# Patient Record
Sex: Female | Born: 1985 | Race: White | Hispanic: No | Marital: Single | Smoking: Former smoker
Health system: Southern US, Community
[De-identification: ages and names within clinical notes are randomized; demographics above are authoritative.]

## PROBLEM LIST (undated history)

## (undated) DIAGNOSIS — N83209 Unspecified ovarian cyst, unspecified side: Secondary | ICD-10-CM

## (undated) DIAGNOSIS — N200 Calculus of kidney: Secondary | ICD-10-CM

## (undated) DIAGNOSIS — K8020 Calculus of gallbladder without cholecystitis without obstruction: Secondary | ICD-10-CM

## (undated) DIAGNOSIS — K279 Peptic ulcer, site unspecified, unspecified as acute or chronic, without hemorrhage or perforation: Secondary | ICD-10-CM

## (undated) HISTORY — PX: LITHOTRIPSY: SUR834

---

## 2008-03-10 ENCOUNTER — Emergency Department (HOSPITAL_COMMUNITY): Admission: EM | Admit: 2008-03-10 | Discharge: 2008-03-11 | Payer: Self-pay | Admitting: Emergency Medicine

## 2008-04-27 ENCOUNTER — Emergency Department (HOSPITAL_COMMUNITY): Admission: EM | Admit: 2008-04-27 | Discharge: 2008-04-27 | Payer: Self-pay | Admitting: Emergency Medicine

## 2009-07-24 ENCOUNTER — Emergency Department (HOSPITAL_COMMUNITY): Admission: EM | Admit: 2009-07-24 | Discharge: 2009-07-25 | Payer: Self-pay | Admitting: Emergency Medicine

## 2009-11-13 ENCOUNTER — Emergency Department (HOSPITAL_COMMUNITY): Admission: EM | Admit: 2009-11-13 | Discharge: 2009-11-14 | Payer: Self-pay | Admitting: Emergency Medicine

## 2009-11-16 ENCOUNTER — Inpatient Hospital Stay (HOSPITAL_COMMUNITY): Admission: EM | Admit: 2009-11-16 | Discharge: 2009-11-19 | Payer: Self-pay | Admitting: Emergency Medicine

## 2010-04-24 ENCOUNTER — Emergency Department (HOSPITAL_COMMUNITY): Admission: EM | Admit: 2010-04-24 | Discharge: 2010-04-24 | Payer: Self-pay | Admitting: Emergency Medicine

## 2010-05-05 ENCOUNTER — Emergency Department (HOSPITAL_COMMUNITY): Admission: EM | Admit: 2010-05-05 | Discharge: 2010-05-05 | Payer: Self-pay | Admitting: Emergency Medicine

## 2010-10-04 ENCOUNTER — Emergency Department (HOSPITAL_COMMUNITY)
Admission: EM | Admit: 2010-10-04 | Discharge: 2010-10-04 | Payer: Self-pay | Source: Home / Self Care | Admitting: Emergency Medicine

## 2010-12-20 ENCOUNTER — Emergency Department (HOSPITAL_COMMUNITY)
Admission: EM | Admit: 2010-12-20 | Discharge: 2010-12-20 | Disposition: A | Discharge status: Home / Self Care | Payer: Self-pay | Attending: Emergency Medicine | Admitting: Emergency Medicine

## 2010-12-20 DIAGNOSIS — R300 Dysuria: Secondary | ICD-10-CM | POA: Insufficient documentation

## 2010-12-20 DIAGNOSIS — R112 Nausea with vomiting, unspecified: Secondary | ICD-10-CM | POA: Insufficient documentation

## 2010-12-20 DIAGNOSIS — N12 Tubulo-interstitial nephritis, not specified as acute or chronic: Secondary | ICD-10-CM | POA: Insufficient documentation

## 2010-12-20 DIAGNOSIS — M545 Low back pain, unspecified: Secondary | ICD-10-CM | POA: Insufficient documentation

## 2010-12-20 DIAGNOSIS — R6883 Chills (without fever): Secondary | ICD-10-CM | POA: Insufficient documentation

## 2010-12-20 DIAGNOSIS — R1032 Left lower quadrant pain: Secondary | ICD-10-CM | POA: Insufficient documentation

## 2010-12-20 DIAGNOSIS — R61 Generalized hyperhidrosis: Secondary | ICD-10-CM | POA: Insufficient documentation

## 2010-12-20 LAB — URINALYSIS, ROUTINE W REFLEX MICROSCOPIC
Ketones, ur: 15 mg/dL — AB
Specific Gravity, Urine: 1.031 — ABNORMAL HIGH (ref 1.005–1.030)
Urine Glucose, Fasting: NEGATIVE mg/dL
pH: 6 (ref 5.0–8.0)

## 2010-12-20 LAB — DIFFERENTIAL
Basophils Absolute: 0.1 10*3/uL (ref 0.0–0.1)
Lymphs Abs: 3.4 10*3/uL (ref 0.7–4.0)
Monocytes Relative: 9 % (ref 3–12)
Neutro Abs: 6 10*3/uL (ref 1.7–7.7)
Neutrophils Relative %: 56 % (ref 43–77)

## 2010-12-20 LAB — POCT I-STAT, CHEM 8
BUN: 13 mg/dL (ref 6–23)
Calcium, Ion: 1.17 mmol/L (ref 1.12–1.32)
Chloride: 105 mEq/L (ref 96–112)
Creatinine, Ser: 0.8 mg/dL (ref 0.4–1.2)
Hemoglobin: 15 g/dL (ref 12.0–15.0)
TCO2: 25 mmol/L (ref 0–100)

## 2010-12-20 LAB — CBC
HCT: 42 % (ref 36.0–46.0)
MCHC: 34.3 g/dL (ref 30.0–36.0)

## 2010-12-20 LAB — URINE MICROSCOPIC-ADD ON

## 2011-01-10 LAB — URINALYSIS, ROUTINE W REFLEX MICROSCOPIC
Bilirubin Urine: NEGATIVE
Glucose, UA: NEGATIVE mg/dL
Hgb urine dipstick: NEGATIVE
Urobilinogen, UA: 0.2 mg/dL (ref 0.0–1.0)
pH: 7 (ref 5.0–8.0)

## 2011-01-10 LAB — POCT I-STAT, CHEM 8
BUN: 9 mg/dL (ref 6–23)
Calcium, Ion: 1.18 mmol/L (ref 1.12–1.32)
Chloride: 106 mEq/L (ref 96–112)
Creatinine, Ser: 0.6 mg/dL (ref 0.4–1.2)
Glucose, Bld: 115 mg/dL — ABNORMAL HIGH (ref 70–99)
HCT: 42 % (ref 36.0–46.0)
Hemoglobin: 14.3 g/dL (ref 12.0–15.0)

## 2011-01-15 LAB — DIFFERENTIAL
Basophils Absolute: 0.2 10*3/uL — ABNORMAL HIGH (ref 0.0–0.1)
Basophils Relative: 1 % (ref 0–1)
Eosinophils Absolute: 0 10*3/uL (ref 0.0–0.7)
Lymphocytes Relative: 28 % (ref 12–46)
Lymphs Abs: 1.4 10*3/uL (ref 0.7–4.0)
Monocytes Absolute: 0.5 10*3/uL (ref 0.1–1.0)
Monocytes Relative: 3 % (ref 3–12)
Neutro Abs: 8.2 10*3/uL — ABNORMAL HIGH (ref 1.7–7.7)
Neutro Abs: 16.1 10*3/uL — ABNORMAL HIGH (ref 1.7–7.7)
Neutrophils Relative %: 61 % (ref 43–77)
Neutrophils Relative %: 89 % — ABNORMAL HIGH (ref 43–77)

## 2011-01-15 LAB — POCT I-STAT, CHEM 8
BUN: 11 mg/dL (ref 6–23)
Calcium, Ion: 1.12 mmol/L (ref 1.12–1.32)
Chloride: 106 mEq/L (ref 96–112)
HCT: 39 % (ref 36.0–46.0)
Potassium: 3.4 mEq/L — ABNORMAL LOW (ref 3.5–5.1)
Sodium: 141 mEq/L (ref 135–145)

## 2011-01-15 LAB — BASIC METABOLIC PANEL
CO2: 23 mEq/L (ref 19–32)
Chloride: 105 mEq/L (ref 96–112)
Creatinine, Ser: 0.74 mg/dL (ref 0.4–1.2)
GFR calc Af Amer: >60 mL/min (ref >60)
Potassium: 3.3 mEq/L — ABNORMAL LOW (ref 3.5–5.1)
Sodium: 139 mEq/L (ref 135–145)

## 2011-01-15 LAB — URINALYSIS, ROUTINE W REFLEX MICROSCOPIC
Bilirubin Urine: NEGATIVE
Glucose, UA: NEGATIVE mg/dL
Glucose, UA: NEGATIVE mg/dL
Hgb urine dipstick: NEGATIVE
Ketones, ur: 15 mg/dL — AB
Ketones, ur: NEGATIVE mg/dL
Nitrite: NEGATIVE
Nitrite: NEGATIVE
Protein, ur: NEGATIVE mg/dL
Protein, ur: NEGATIVE mg/dL
Specific Gravity, Urine: 1.025 (ref 1.005–1.030)
Urobilinogen, UA: 0.2 mg/dL (ref 0.0–1.0)

## 2011-01-15 LAB — CBC
Hemoglobin: 13.2 g/dL (ref 12.0–15.0)
MCHC: 34.3 g/dL (ref 30.0–36.0)
MCV: 89.8 fL (ref 78.0–100.0)
Platelets: 290 10*3/uL (ref 150–400)
RBC: 4.33 MIL/uL (ref 3.87–5.11)
RDW: 12 % (ref 11.5–15.5)
WBC: 13.5 10*3/uL — ABNORMAL HIGH (ref 4.0–10.5)
WBC: 18 10*3/uL — ABNORMAL HIGH (ref 4.0–10.5)

## 2011-01-15 LAB — WET PREP, GENITAL
Clue Cells Wet Prep HPF POC: NONE SEEN
Trich, Wet Prep: NONE SEEN
Yeast Wet Prep HPF POC: NONE SEEN

## 2011-01-15 LAB — POCT PREGNANCY, URINE: Preg Test, Ur: NEGATIVE

## 2011-01-15 LAB — URINE CULTURE: Colony Count: 2000

## 2011-01-15 LAB — URINE MICROSCOPIC-ADD ON

## 2011-02-03 LAB — URINE MICROSCOPIC-ADD ON

## 2011-02-03 LAB — URINALYSIS, ROUTINE W REFLEX MICROSCOPIC
Glucose, UA: NEGATIVE mg/dL
Ketones, ur: NEGATIVE mg/dL
Protein, ur: NEGATIVE mg/dL

## 2011-02-03 LAB — WET PREP, GENITAL

## 2011-02-03 LAB — RPR: RPR Ser Ql: NONREACTIVE

## 2011-03-25 ENCOUNTER — Emergency Department (HOSPITAL_COMMUNITY)
Admission: EM | Admit: 2011-03-25 | Discharge: 2011-03-25 | Disposition: A | Discharge status: Home / Self Care | Payer: Self-pay | Attending: Emergency Medicine | Admitting: Emergency Medicine

## 2011-03-25 DIAGNOSIS — R109 Unspecified abdominal pain: Secondary | ICD-10-CM | POA: Insufficient documentation

## 2011-03-25 DIAGNOSIS — R300 Dysuria: Secondary | ICD-10-CM | POA: Insufficient documentation

## 2011-03-25 DIAGNOSIS — R112 Nausea with vomiting, unspecified: Secondary | ICD-10-CM | POA: Insufficient documentation

## 2011-03-25 DIAGNOSIS — Z87442 Personal history of urinary calculi: Secondary | ICD-10-CM | POA: Insufficient documentation

## 2011-03-25 LAB — URINALYSIS, ROUTINE W REFLEX MICROSCOPIC
Nitrite: NEGATIVE
Specific Gravity, Urine: 1.021 (ref 1.005–1.030)
Urobilinogen, UA: 0.2 mg/dL (ref 0.0–1.0)

## 2011-03-25 LAB — POCT PREGNANCY, URINE: Preg Test, Ur: NEGATIVE

## 2011-03-25 LAB — URINE MICROSCOPIC-ADD ON

## 2011-03-26 LAB — URINE CULTURE

## 2011-04-13 ENCOUNTER — Emergency Department (HOSPITAL_COMMUNITY): Payer: Self-pay

## 2011-04-13 ENCOUNTER — Emergency Department (HOSPITAL_COMMUNITY)
Admission: EM | Admit: 2011-04-13 | Discharge: 2011-04-13 | Disposition: A | Discharge status: Home / Self Care | Payer: Self-pay | Attending: Emergency Medicine | Admitting: Emergency Medicine

## 2011-04-13 DIAGNOSIS — N949 Unspecified condition associated with female genital organs and menstrual cycle: Secondary | ICD-10-CM | POA: Insufficient documentation

## 2011-04-13 DIAGNOSIS — N938 Other specified abnormal uterine and vaginal bleeding: Secondary | ICD-10-CM | POA: Insufficient documentation

## 2011-04-13 DIAGNOSIS — R109 Unspecified abdominal pain: Secondary | ICD-10-CM | POA: Insufficient documentation

## 2011-04-13 LAB — URINALYSIS, ROUTINE W REFLEX MICROSCOPIC
Leukocytes, UA: NEGATIVE
Protein, ur: NEGATIVE mg/dL
Urobilinogen, UA: 0.2 mg/dL (ref 0.0–1.0)

## 2011-04-13 LAB — POCT PREGNANCY, URINE: Preg Test, Ur: NEGATIVE

## 2011-04-13 LAB — WET PREP, GENITAL

## 2011-07-14 ENCOUNTER — Emergency Department (HOSPITAL_COMMUNITY)
Admission: EM | Admit: 2011-07-14 | Discharge: 2011-07-14 | Disposition: A | Discharge status: Home / Self Care | Payer: Self-pay | Attending: Emergency Medicine | Admitting: Emergency Medicine

## 2011-07-14 DIAGNOSIS — R21 Rash and other nonspecific skin eruption: Secondary | ICD-10-CM | POA: Insufficient documentation

## 2011-07-27 LAB — CBC
Hemoglobin: 14.8
MCV: 87.1
RBC: 4.96
WBC: 10.3

## 2011-07-27 LAB — COMPREHENSIVE METABOLIC PANEL
ALT: 45 — ABNORMAL HIGH
AST: 34
CO2: 25
Chloride: 105
Creatinine, Ser: 0.65
GFR calc Af Amer: >60
GFR calc non Af Amer: >60
Glucose, Bld: 100 — ABNORMAL HIGH
Total Bilirubin: 0.7

## 2011-07-27 LAB — URINALYSIS, ROUTINE W REFLEX MICROSCOPIC
Protein, ur: NEGATIVE
Urobilinogen, UA: 0.2

## 2011-07-27 LAB — DIFFERENTIAL
Basophils Absolute: 0
Eosinophils Absolute: 0
Eosinophils Relative: 0
Lymphocytes Relative: 24

## 2011-07-27 LAB — POCT PREGNANCY, URINE: Preg Test, Ur: NEGATIVE

## 2011-07-27 LAB — URINE MICROSCOPIC-ADD ON

## 2011-09-28 ENCOUNTER — Emergency Department (HOSPITAL_COMMUNITY): Payer: Self-pay

## 2011-09-28 ENCOUNTER — Encounter: Payer: Self-pay | Admitting: *Deleted

## 2011-09-28 ENCOUNTER — Emergency Department (HOSPITAL_COMMUNITY)
Admission: EM | Admit: 2011-09-28 | Discharge: 2011-09-28 | Disposition: A | Discharge status: Home / Self Care | Payer: Self-pay | Attending: Emergency Medicine | Admitting: Emergency Medicine

## 2011-09-28 DIAGNOSIS — F17200 Nicotine dependence, unspecified, uncomplicated: Secondary | ICD-10-CM | POA: Insufficient documentation

## 2011-09-28 DIAGNOSIS — J069 Acute upper respiratory infection, unspecified: Secondary | ICD-10-CM | POA: Insufficient documentation

## 2011-09-28 HISTORY — DX: Calculus of kidney: N20.0

## 2011-09-28 MED ORDER — ALBUTEROL SULFATE HFA 108 (90 BASE) MCG/ACT IN AERS
2.0000 | INHALATION_SPRAY | RESPIRATORY_TRACT | Status: DC | PRN
  Administered 2011-09-28: 2 via RESPIRATORY_TRACT
  Filled 2011-09-28: qty 6.7

## 2011-09-28 NOTE — ED Notes (Signed)
Pt states "I've been having cold, cough, h/a x 3 days, I cough up light green thick stuff"

## 2011-09-28 NOTE — ED Provider Notes (Signed)
History     CSN: [id number] Arrival date & time: 09/28/2011  8:27 AM   First MD Initiated Contact with Patient 09/28/11 [phone]      Chief Complaint  Patient presents with  . URI    (Consider location/radiation/quality/duration/timing/severity/associated sxs/prior treatment) Patient is a 25 y.o. female presenting with URI. The history is provided by the patient.  URI The primary symptoms include headaches, sore throat, cough and myalgias. Primary symptoms do not include fever, wheezing, abdominal pain, nausea or vomiting. The current episode started 2 days ago. This is a new problem. The problem has been gradually worsening.  The headache began 2 days ago. The headache developed gradually. Headache is a new problem. The headache is present rarely. The pain from the headache is at a severity of 1/10. The headache is not associated with stiff neck.  The sore throat began today. The sore throat has been gradually improving since its onset. The sore throat is mild in intensity. The sore throat is not accompanied by trouble swallowing or hoarse voice.  The cough began yesterday. The cough is productive. The sputum is green.  The onset of the illness is associated with exposure to sick contacts (Multiple sick contacts at work with similar symptoms). Symptoms associated with the illness include congestion and rhinorrhea. The illness is not associated with chills or facial pain.    Past Medical History  Diagnosis Date  . Kidney stone     History reviewed. No pertinent past surgical history.  No family history on file.  History  Substance Use Topics  . Smoking status: Current Everyday Smoker -- 0.5 packs/day  . Smokeless tobacco: Not on file  . Alcohol Use: Yes     socially    OB History    Grav Para Term Preterm Abortions TAB SAB Ect Mult Living                  Review of Systems  Constitutional: Negative for fever and chills.  HENT: Positive for congestion, sore throat and  rhinorrhea. Negative for hoarse voice and trouble swallowing.   Respiratory: Positive for cough. Negative for wheezing.   Gastrointestinal: Negative for nausea, vomiting and abdominal pain.  Musculoskeletal: Positive for myalgias.  Neurological: Positive for headaches.  All other systems reviewed and are negative.    Allergies  Stadol  Home Medications   Current Outpatient Rx  Name Route Sig Dispense Refill  . FERROUS SULFATE 325 (65 FE) MG PO TABS Oral Take 325 mg by mouth daily.      Marland Kitchen HYDROCORTISONE 1 % EX CREA Topical Apply 1 application topically as needed. For itching     . THERA M PLUS PO TABS Oral Take 1 tablet by mouth daily. Womens Multivitamin     . VITAMIN B6 PO Oral Take 1 tablet by mouth daily.        BP 109/78  Pulse 79  Temp(Src) 98.3 F (36.8 C) (Oral)  Resp 16  Wt 153 lb (69.4 kg)  SpO2 95%  LMP 09/11/2011  Physical Exam  Nursing note and vitals reviewed. Constitutional: She is oriented to person, place, and time. She appears well-developed and well-nourished. No distress.  HENT:  Head: Normocephalic and atraumatic.  Right Ear: Tympanic membrane normal.  Left Ear: Tympanic membrane normal.  Mouth/Throat: Posterior oropharyngeal erythema present. No oropharyngeal exudate.  Eyes: EOM are normal. Pupils are equal, round, and reactive to light.  Neck: Normal range of motion. Neck supple.  Cardiovascular: Normal rate, regular rhythm, normal  heart sounds and intact distal pulses.  Exam reveals no friction rub.   No murmur heard. Pulmonary/Chest: Effort normal and breath sounds normal. She has no wheezes. She has no rales.  Abdominal: Soft. Bowel sounds are normal. She exhibits no distension. There is no tenderness. There is no rebound and no guarding.  Musculoskeletal: Normal range of motion. She exhibits no tenderness.       No edema  Lymphadenopathy:    She has no cervical adenopathy.  Neurological: She is alert and oriented to person, place, and time.  No cranial nerve deficit.  Skin: Skin is warm and dry. No rash noted.  Psychiatric: She has a normal mood and affect. Her behavior is normal.    ED Course  Procedures (including critical care time)  Labs Reviewed - No data to display Dg Chest 2 View  09/28/2011  *RADIOLOGY REPORT*  Clinical Data: Productive cough and fever  CHEST - 2 VIEW  Comparison: None.  Findings: Normal cardiac silhouette and mediastinal contours.  No focal parenchymal opacities.  No pleural effusion or pneumothorax. No acute osseous abnormalities.  IMPRESSION: No acute cardiopulmonary disease.  Specifically, no evidence of pneumonia.  Original Report Authenticated By: Waynard Reeds, M.D.     No diagnosis found.    MDM   Pt with symptoms consistent with viral URI.  Well appearing here.  No signs of breathing difficulty  No signs of pharyngitis, otitis or abnormal abdominal findings.   CXR wnl and pt to return with any further problems.         Gwyneth Sprout, MD 09/28/11 1009

## 2011-12-04 ENCOUNTER — Emergency Department (HOSPITAL_COMMUNITY)
Admission: EM | Admit: 2011-12-04 | Discharge: 2011-12-04 | Disposition: A | Discharge status: Home / Self Care | Payer: Self-pay | Attending: Emergency Medicine | Admitting: Emergency Medicine

## 2011-12-04 DIAGNOSIS — F17200 Nicotine dependence, unspecified, uncomplicated: Secondary | ICD-10-CM | POA: Insufficient documentation

## 2011-12-04 DIAGNOSIS — R300 Dysuria: Secondary | ICD-10-CM | POA: Insufficient documentation

## 2011-12-04 DIAGNOSIS — R5381 Other malaise: Secondary | ICD-10-CM | POA: Insufficient documentation

## 2011-12-04 DIAGNOSIS — R531 Weakness: Secondary | ICD-10-CM

## 2011-12-04 LAB — TSH: TSH: 0.61 u[IU]/mL (ref 0.350–4.500)

## 2011-12-04 LAB — POCT I-STAT, CHEM 8
BUN: 14 mg/dL (ref 6–23)
HCT: 39 % (ref 36.0–46.0)
Hemoglobin: 13.3 g/dL (ref 12.0–15.0)
Sodium: 141 mEq/L (ref 135–145)
TCO2: 24 mmol/L (ref 0–100)

## 2011-12-04 LAB — POCT PREGNANCY, URINE: Preg Test, Ur: NEGATIVE

## 2011-12-04 LAB — DIFFERENTIAL
Basophils Absolute: 0 10*3/uL (ref 0.0–0.1)
Lymphocytes Relative: 35 % (ref 12–46)
Monocytes Absolute: 0.7 10*3/uL (ref 0.1–1.0)
Monocytes Relative: 7 % (ref 3–12)
Neutro Abs: 4.7 10*3/uL (ref 1.7–7.7)

## 2011-12-04 LAB — URINALYSIS, ROUTINE W REFLEX MICROSCOPIC
Bilirubin Urine: NEGATIVE
Nitrite: NEGATIVE
Specific Gravity, Urine: 1.021 (ref 1.005–1.030)
pH: 6 (ref 5.0–8.0)

## 2011-12-04 LAB — CBC
HCT: 37.4 % (ref 36.0–46.0)
Hemoglobin: 12.9 g/dL (ref 12.0–15.0)
WBC: 9.1 10*3/uL (ref 4.0–10.5)

## 2011-12-04 LAB — T4, FREE: Free T4: 1.1 ng/dL (ref 0.80–1.80)

## 2011-12-04 NOTE — ED Provider Notes (Signed)
History     CSN: [id number]  Arrival date & time 12/04/11  4098   First MD Initiated Contact with Patient 12/04/11 1010      Chief Complaint  Patient presents with  . Weakness  . Dysuria    (Consider location/radiation/quality/duration/timing/severity/associated sxs/prior treatment) HPI  Patient presents to emergency department with chief complaint of a few week hx of weakness and "fatigue" but a one-week history of gradual increasing "feeling drained with no energy." Patient states she had her Implanon birth control removed a few months ago and therefore has had "weird periods" and she thinks her last menstrual period was over a month ago. She is G2P1A1L1. Patient also complains of some mild burning with urination but denies any fevers, chills, rash, chest pain, shortness of breath, flank pain, abdominal pain, vaginal spotting, vaginal discharge, hematuria, blood in her stool. Patient states she has no known medical problems and has been told that she had depression in the past however she's never been treated pharmacologically for her depression. Patient denies aggravating or alleviating factors. She denies similar symptoms in the past. She denies any nausea or vomiting.  Past Medical History  Diagnosis Date  . Kidney stone     No past surgical history on file.  No family history on file.  History  Substance Use Topics  . Smoking status: Current Everyday Smoker -- 0.5 packs/day  . Smokeless tobacco: Not on file  . Alcohol Use: Yes     socially    OB History    Grav Para Term Preterm Abortions TAB SAB Ect Mult Living                  Review of Systems  All other systems reviewed and are negative.    Allergies  Stadol  Home Medications   Current Outpatient Rx  Name Route Sig Dispense Refill  . FERROUS SULFATE 325 (65 FE) MG PO TABS Oral Take 325 mg by mouth daily.      Marland Kitchen HYDROCORTISONE 1 % EX CREA Topical Apply 1 application topically as needed. For itching       . THERA M PLUS PO TABS Oral Take 1 tablet by mouth daily. Womens Multivitamin     . VITAMIN B6 PO Oral Take 1 tablet by mouth daily.        BP 101/62  Pulse 64  Temp(Src) 98.4 F (36.9 C) (Oral)  Resp 16  SpO2 97%  Physical Exam  Nursing note and vitals reviewed. Constitutional: She is oriented to person, place, and time. She appears well-developed and well-nourished. No distress.  HENT:  Head: Normocephalic and atraumatic.  Eyes: Conjunctivae are normal.  Neck: Normal range of motion. Neck supple. No thyromegaly present.  Cardiovascular: Normal rate, regular rhythm, normal heart sounds and intact distal pulses.  Exam reveals no gallop and no friction rub.   No murmur heard. Pulmonary/Chest: Effort normal and breath sounds normal. No respiratory distress. She has no wheezes. She has no rales. She exhibits no tenderness.  Abdominal: Soft. Bowel sounds are normal. She exhibits no distension and no mass. There is no tenderness. There is no rebound and no guarding.  Musculoskeletal: Normal range of motion. She exhibits no edema and no tenderness.  Neurological: She is alert and oriented to person, place, and time.  Skin: Skin is warm and dry. No rash noted. She is not diaphoretic. No erythema.  Psychiatric: She has a normal mood and affect.    ED Course  Procedures (including critical care  time)  Labs Reviewed  URINALYSIS, ROUTINE W REFLEX MICROSCOPIC - Abnormal; Notable for the following:    APPearance CLOUDY (*)    All other components within normal limits  CBC  DIFFERENTIAL  POCT I-STAT, CHEM 8  POCT PREGNANCY, URINE  TSH  T4, FREE   No results found.   1. Weakness       MDM  Urine pregnancy negative, normal hemoglobin, and normal electrolytes. Other acute findings on labs to suggest origin of weakness and fatigue however thyroid studies pending the patient voices her understanding will be called to her only if abnormal results that require intervention but that  followup with a primary care physician for ongoing evaluation of current symptoms is very important. Patient is afebrile, nontoxic-appearing, with normal vital signs, and normal exam.        Jenness Corner, PA 12/04/11 1205

## 2011-12-04 NOTE — ED Notes (Signed)
Pt has felt "drained and with no energy" for the past couple of weeks.....but especially for the past week, it has been worse.  She had an Implanon birth control removed 4 months ago.  She tried birth control pills for awhile, but stopped about 2 months ago.  She has not used any birth control since then.  Her last period was "about a month ago."  She has had unprotected sex, and the last intercourse was about 2 weeks ago.  She describes some pressure and burning when she urinates.

## 2011-12-04 NOTE — ED Provider Notes (Signed)
Medical screening examination/treatment/procedure(s) were performed by non-physician practitioner and as supervising physician I was immediately available for consultation/collaboration.   Glynn Octave, MD 12/04/11 1745

## 2012-01-25 ENCOUNTER — Encounter (HOSPITAL_COMMUNITY): Payer: Self-pay | Admitting: Emergency Medicine

## 2012-01-25 ENCOUNTER — Emergency Department (HOSPITAL_COMMUNITY)
Admission: EM | Admit: 2012-01-25 | Discharge: 2012-01-25 | Disposition: A | Discharge status: Home / Self Care | Payer: Self-pay | Attending: Emergency Medicine | Admitting: Emergency Medicine

## 2012-01-25 DIAGNOSIS — F17200 Nicotine dependence, unspecified, uncomplicated: Secondary | ICD-10-CM | POA: Insufficient documentation

## 2012-01-25 DIAGNOSIS — A088 Other specified intestinal infections: Secondary | ICD-10-CM | POA: Insufficient documentation

## 2012-01-25 DIAGNOSIS — A084 Viral intestinal infection, unspecified: Secondary | ICD-10-CM

## 2012-01-25 LAB — URINALYSIS, ROUTINE W REFLEX MICROSCOPIC
Ketones, ur: NEGATIVE mg/dL
Leukocytes, UA: NEGATIVE
Nitrite: NEGATIVE
Protein, ur: NEGATIVE mg/dL
Urobilinogen, UA: 0.2 mg/dL (ref 0.0–1.0)
pH: 5.5 (ref 5.0–8.0)

## 2012-01-25 LAB — POCT PREGNANCY, URINE: Preg Test, Ur: NEGATIVE

## 2012-01-25 MED ORDER — PROMETHAZINE HCL 25 MG PO TABS: 25.0000 mg | ORAL_TABLET | Freq: Four times a day (QID) | ORAL | Status: DC | PRN

## 2012-01-25 MED ORDER — ONDANSETRON 4 MG PO TBDP
4.0000 mg | ORAL_TABLET | Freq: Once | ORAL | Status: AC
  Administered 2012-01-25: 4 mg via ORAL
  Filled 2012-01-25: qty 1

## 2012-01-25 NOTE — Discharge Instructions (Signed)
Viral Gastroenteritis Viral gastroenteritis is also known as stomach flu. This condition affects the stomach and intestinal tract. It can cause sudden diarrhea and vomiting. The illness typically lasts 3 to 8 days. Most people develop an immune response that eventually gets rid of the virus. While this natural response develops, the virus can make you quite ill. CAUSES  Many different viruses can cause gastroenteritis, such as rotavirus or noroviruses. You can catch one of these viruses by consuming contaminated food or water. You may also catch a virus by sharing utensils or other personal items with an infected person or by touching a contaminated surface. SYMPTOMS  The most common symptoms are diarrhea and vomiting. These problems can cause a severe loss of body fluids (dehydration) and a body salt (electrolyte) imbalance. Other symptoms may include:  Fever.   Headache.   Fatigue.   Abdominal pain.  DIAGNOSIS  Your caregiver can usually diagnose viral gastroenteritis based on your symptoms and a physical exam. A stool sample may also be taken to test for the presence of viruses or other infections. TREATMENT  This illness typically goes away on its own. Treatments are aimed at rehydration. The most serious cases of viral gastroenteritis involve vomiting so severely that you are not able to keep fluids down. In these cases, fluids must be given through an intravenous line (IV). HOME CARE INSTRUCTIONS   Drink enough fluids to keep your urine clear or pale yellow. Drink small amounts of fluids frequently and increase the amounts as tolerated.   Ask your caregiver for specific rehydration instructions.   Avoid:   Foods high in sugar.   Alcohol.   Carbonated drinks.   Tobacco.   Juice.   Caffeine drinks.   Extremely hot or cold fluids.   Fatty, greasy foods.   Too much intake of anything at one time.   Dairy products until 24 to 48 hours after diarrhea stops.   You may  consume probiotics. Probiotics are active cultures of beneficial bacteria. They may lessen the amount and number of diarrheal stools in adults. Probiotics can be found in yogurt with active cultures and in supplements.   Wash your hands well to avoid spreading the virus.   Only take over-the-counter or prescription medicines for pain, discomfort, or fever as directed by your caregiver. Do not give aspirin to children. Antidiarrheal medicines are not recommended.   Ask your caregiver if you should continue to take your regular prescribed and over-the-counter medicines.   Keep all follow-up appointments as directed by your caregiver.  SEEK IMMEDIATE MEDICAL CARE IF:   You are unable to keep fluids down.   You do not urinate at least once every 6 to 8 hours.   You develop shortness of breath.   You notice blood in your stool or vomit. This may look like coffee grounds.   You have abdominal pain that increases or is concentrated in one small area (localized).   You have persistent vomiting or diarrhea.   You have a fever.   The patient is a child younger than 3 months, and he or she has a fever.   The patient is a child older than 3 months, and he or she has a fever and persistent symptoms.   The patient is a child older than 3 months, and he or she has a fever and symptoms suddenly get worse.   The patient is a baby, and he or she has no tears when crying.  MAKE SURE YOU:     Understand these instructions.   Will watch your condition.   Will get help right away if you are not doing well or get worse.  Document Released: 10/16/2005 Document Revised: 10/05/2011 Document Reviewed: 08/02/2011 [employer] Patient Information 2012 [employer]. 

## 2012-01-25 NOTE — ED Notes (Signed)
Pt states had n/v/d 2-3 days ago, last episode diarrhea yesterday am and last emesis 2 days ago. Pt states has had UTI in past a year ago and states now with dysuria. Pt states"i need a note to go back to work or else they wont let me back". Pt reports some nausea only.States ate last night and drank and has had fluids this am.

## 2012-01-25 NOTE — ED Provider Notes (Signed)
History     CSN: [id number]  Arrival date & time 01/25/12  0745   First MD Initiated Contact with Patient 01/25/12 [phone]      Chief Complaint  Patient presents with  . Nausea    (Consider location/radiation/quality/duration/timing/severity/associated sxs/prior treatment) Patient is a 26 y.o. female presenting with vomiting. The history is provided by the patient. No language interpreter was used.  Emesis  This is a new problem. The current episode started 2 days ago. The problem occurs 2 to 4 times per day. The problem has been gradually improving. The emesis has an appearance of stomach contents. There has been no fever. Associated symptoms include diarrhea. Pertinent negatives include no abdominal pain, no arthralgias, no chills, no cough, no fever, no headaches and no myalgias. Risk factors include ill contacts (children).    Past Medical History  Diagnosis Date  . Kidney stone     History reviewed. No pertinent past surgical history.  No family history on file.  History  Substance Use Topics  . Smoking status: Current Everyday Smoker -- 0.5 packs/day  . Smokeless tobacco: Not on file  . Alcohol Use: Yes     socially    OB History    Grav Para Term Preterm Abortions TAB SAB Ect Mult Living                  Review of Systems  Constitutional: Negative for fever, chills, activity change, appetite change and fatigue.  HENT: Negative for congestion, sore throat, rhinorrhea, neck pain and neck stiffness.   Respiratory: Negative for cough and shortness of breath.   Cardiovascular: Negative for chest pain and palpitations.  Gastrointestinal: Positive for nausea, vomiting and diarrhea. Negative for abdominal pain.  Genitourinary: Positive for dysuria, urgency and frequency. Negative for flank pain.  Musculoskeletal: Negative for myalgias, back pain and arthralgias.  Neurological: Negative for dizziness, weakness, light-headedness, numbness and headaches.  All other  systems reviewed and are negative.    Allergies  Stadol  Home Medications   Current Outpatient Rx  Name Route Sig Dispense Refill  . FERROUS SULFATE 325 (65 FE) MG PO TABS Oral Take 325 mg by mouth daily.      Marland Kitchen HYDROCORTISONE 1 % EX CREA Topical Apply 1 application topically as needed. For itching     . THERA M PLUS PO TABS Oral Take 1 tablet by mouth daily. Womens Multivitamin     . PROMETHAZINE HCL 25 MG PO TABS Oral Take 1 tablet (25 mg total) by mouth every 6 (six) hours as needed for nausea. 20 tablet 0  . VITAMIN B6 PO Oral Take 1 tablet by mouth daily.        BP 100/62  Pulse 70  Temp(Src) 98.8 F (37.1 C) (Oral)  Resp 16  SpO2 100%  LMP 12/05/2011  Physical Exam  Nursing note and vitals reviewed. Constitutional: She is oriented to person, place, and time. She appears well-developed and well-nourished.  HENT:  Head: Normocephalic and atraumatic.  Mouth/Throat: Oropharynx is clear and moist.  Eyes: Conjunctivae and EOM are normal. Pupils are equal, round, and reactive to light.  Neck: Normal range of motion. Neck supple.  Cardiovascular: Normal rate, regular rhythm, normal heart sounds and intact distal pulses.  Exam reveals no gallop and no friction rub.   No murmur heard. Pulmonary/Chest: Effort normal and breath sounds normal. No respiratory distress.  Abdominal: Soft. Bowel sounds are normal. There is no tenderness. There is no rebound and no guarding.  Musculoskeletal:  Normal range of motion. She exhibits no tenderness.  Neurological: She is alert and oriented to person, place, and time. No cranial nerve deficit.  Skin: Skin is warm and dry. No rash noted.    ED Course  Procedures (including critical care time)  Labs Reviewed  URINALYSIS, ROUTINE W REFLEX MICROSCOPIC - Abnormal; Notable for the following:    APPearance CLOUDY (*)    All other components within normal limits  POCT PREGNANCY, URINE   No results found.   1. Viral gastroenteritis        MDM  Gastroenteritis which is resolving. Urinalysis is negative. She'll be discharged home on Phenergan for continued nausea. She is okay to go back to work. No evidence of dehydration. Is tolerating oral intake. Will be discharged home.        Dayton Bailiff, MD 01/25/12 [phone]

## 2012-03-23 ENCOUNTER — Encounter (HOSPITAL_COMMUNITY): Payer: Self-pay

## 2012-03-23 ENCOUNTER — Emergency Department (HOSPITAL_COMMUNITY)
Admission: EM | Admit: 2012-03-23 | Discharge: 2012-03-23 | Disposition: A | Discharge status: Home / Self Care | Payer: Self-pay | Attending: Emergency Medicine | Admitting: Emergency Medicine

## 2012-03-23 DIAGNOSIS — Z3490 Encounter for supervision of normal pregnancy, unspecified, unspecified trimester: Secondary | ICD-10-CM

## 2012-03-23 DIAGNOSIS — R300 Dysuria: Secondary | ICD-10-CM | POA: Insufficient documentation

## 2012-03-23 DIAGNOSIS — N390 Urinary tract infection, site not specified: Secondary | ICD-10-CM | POA: Insufficient documentation

## 2012-03-23 DIAGNOSIS — O2390 Unspecified genitourinary tract infection in pregnancy, unspecified trimester: Secondary | ICD-10-CM | POA: Insufficient documentation

## 2012-03-23 LAB — URINALYSIS, ROUTINE W REFLEX MICROSCOPIC
Nitrite: NEGATIVE
Specific Gravity, Urine: 1.027 (ref 1.005–1.030)
Urobilinogen, UA: 0.2 mg/dL (ref 0.0–1.0)

## 2012-03-23 LAB — URINE MICROSCOPIC-ADD ON

## 2012-03-23 LAB — POCT PREGNANCY, URINE: Preg Test, Ur: POSITIVE — AB

## 2012-03-23 MED ORDER — PROMETHAZINE HCL 25 MG PO TABS: 25.0000 mg | ORAL_TABLET | Freq: Four times a day (QID) | ORAL | Status: DC | PRN

## 2012-03-23 MED ORDER — NITROFURANTOIN MONOHYD MACRO 100 MG PO CAPS: 100.0000 mg | ORAL_CAPSULE | Freq: Two times a day (BID) | ORAL | Status: AC

## 2012-03-23 MED ORDER — ONDANSETRON 8 MG PO TBDP
8.0000 mg | ORAL_TABLET | Freq: Once | ORAL | Status: AC
  Administered 2012-03-23: 8 mg via ORAL
  Filled 2012-03-23: qty 1

## 2012-03-23 MED ORDER — NITROFURANTOIN MONOHYD MACRO 100 MG PO CAPS
100.0000 mg | ORAL_CAPSULE | Freq: Once | ORAL | Status: AC
  Administered 2012-03-23: 100 mg via ORAL
  Filled 2012-03-23: qty 1

## 2012-03-23 NOTE — Discharge Instructions (Signed)
Urinary Tract Infection in Pregnancy A urinary tract infection (UTI) is a bacterial infection of the urinary tract. Infection of the urinary tract can include the ureters, kidneys (pyelonephritis), bladder (cystitis), and urethra (urethritis). All pregnant women should be screened for bacteria in the urinary tract. Identifying and treating a UTI will decrease the risk of preterm labor and developing more serious infections in both the mother and baby. CAUSES Bacteria germs cause almost all UTIs. There are many factors that can increase your chances of getting a UTI during pregnancy. These include:  Having a short urethra.   Poor toilet and hygiene habits.   Sexual intercourse.   Blockage of urine along the urinary tract.   Problems with the pelvic muscles or nerves.   Diabetes.   Obesity.   Bladder problems after having several children.   Previous history of UTI.  SYMPTOMS   Pain, burning, or a stinging feeling when urinating.   Suddenly feeling the need to urinate right away (urgency).   Loss of bladder control (urinary incontinence).   Frequent urination, more than is common with pregnancy.   Lower abdominal or back discomfort.   Bad smelling urine.   Cloudy urine.   Blood in the urine (hematuria).   Fever.  When the kidneys are infected, the symptoms may be:  Back pain.   Flank pain on the right side more so than the left.   Fever.   Chills.   Nausea.   Vomiting.  DIAGNOSIS   Urine tests.   Additional tests and procedures may include:   Ultrasound of the kidneys, ureters, bladder, and urethra.   Looking in the bladder with a lighted tube (cystoscopy).   Certain X-ray studies only when absolutely necessary.  Finding out the results of your test Ask when your test results will be ready. Make sure you get your test results. TREATMENT  Antibiotic medicine by mouth.   Antibiotics given through the vein (intravenously), if needed.  HOME CARE  INSTRUCTIONS   Take your antibiotics as directed. Finish them even if you start to feel better. Only take medicine as directed by your caregiver.   Drink enough fluids to keep your urine clear or pale yellow.   Do not have sexual intercourse until the infection is gone and your caregiver says it is okay.   Make sure you are tested for UTIs throughout your pregnancy if you get one. These infections often come back.  Preventing a UTI in the future:  Practice good toilet habits. Always wipe from front to back. Use the tissue only once.   Do not hold your urine. Empty your bladder as soon as possible when the urge comes.   Do not douche or use deodorant sprays.   Wash with soap and warm water around the genital area and the anus.   Empty your bladder before and after sexual intercourse.   Wear underwear with a cotton crotch.   Avoid caffeine and carbonated drinks. They can irritate the bladder.   Drink cranberry juice or take cranberry pills. This may decrease the risk of getting a UTI.   Do not drink alcohol.   Keep all your appointments and tests as scheduled.  SEEK MEDICAL CARE IF:   Your symptoms get worse.   You are still having fevers 2 or more days after treatment begins.   You develop a rash.   You feel that you are having problems with medicines prescribed.   You develop abnormal vaginal discharge.  SEEK IMMEDIATE MEDICAL   CARE IF:   You develop back or flank pain.   You develop chills.   You have blood in your urine.   You develop nausea and vomiting.   You develop contractions of your uterus.   You have a gush of fluid from the vagina.  MAKE SURE YOU:   Understand these instructions.   Will watch your condition.   Will get help right away if you are not doing well or get worse.  Document Released: 02/10/2011 Document Revised: 10/05/2011 Document Reviewed: 02/10/2011 [employer] Patient Information 2012 [employer]. 

## 2012-03-23 NOTE — ED Provider Notes (Signed)
History     CSN: [id number]  Arrival date & time 03/23/12  0944   First MD Initiated Contact with Patient 03/23/12 1030      Chief Complaint  Patient presents with  . Dysuria    (Consider location/radiation/quality/duration/timing/severity/associated sxs/prior treatment) Patient is a 26 y.o. female presenting with dysuria. The history is provided by the patient.  Dysuria  This is a new problem. The current episode started 2 days ago. The problem occurs every urination. The quality of the pain is described as burning. There has been no fever. Associated symptoms include nausea and frequency. Pertinent negatives include no vomiting. Associated symptoms comments: She has a history of urinary tract infections with similar symptoms. She is currently pregnant, estimated to be 12 weeks but unsure. She denies vaginal bleeding or discharge, abdominal pain, back pain or fever. She has had nausea with the pregnancy but no vomiting. .    Past Medical History  Diagnosis Date  . Kidney stone   . Pregnant     History reviewed. No pertinent past surgical history.  No family history on file.  History  Substance Use Topics  . Smoking status: Current Everyday Smoker -- 0.5 packs/day  . Smokeless tobacco: Not on file  . Alcohol Use: Yes     socially    OB History    Grav Para Term Preterm Abortions TAB SAB Ect Mult Living                  Review of Systems  Constitutional: Negative for fever.  Gastrointestinal: Positive for nausea. Negative for vomiting and abdominal pain.  Genitourinary: Positive for dysuria and frequency. Negative for vaginal bleeding and vaginal discharge.  Musculoskeletal: Negative for back pain.    Allergies  Stadol  Home Medications   Current Outpatient Rx  Name Route Sig Dispense Refill  . PRENATAL MULTIVITAMIN CH Oral Take 1 tablet by mouth daily.    Marland Kitchen PROMETHAZINE HCL 25 MG PO TABS Oral Take 1 tablet (25 mg total) by mouth every 6 (six) hours as needed  for nausea. 20 tablet 0    BP 108/74  Pulse 86  Temp(Src) 98.4 F (36.9 C) (Oral)  Resp 16  SpO2 100%  Physical Exam  Constitutional: She is oriented to person, place, and time. She appears well-developed and well-nourished. No distress.  Pulmonary/Chest: Effort normal.  Abdominal: Soft. There is no tenderness. There is no rebound and no guarding.  Neurological: She is alert and oriented to person, place, and time.    ED Course  Procedures (including critical care time)  Labs Reviewed  URINALYSIS, ROUTINE W REFLEX MICROSCOPIC - Abnormal; Notable for the following:    APPearance TURBID (*)    Leukocytes, UA MODERATE (*)    All other components within normal limits  POCT PREGNANCY, URINE - Abnormal; Notable for the following:    Preg Test, Ur POSITIVE (*)    All other components within normal limits  URINE MICROSCOPIC-ADD ON - Abnormal; Notable for the following:    Squamous Epithelial / LPF MANY (*)    All other components within normal limits   No results found. Results for orders placed during the hospital encounter of 03/23/12  URINALYSIS, ROUTINE W REFLEX MICROSCOPIC      Component Value Range   Color, Urine YELLOW  YELLOW    APPearance TURBID (*) CLEAR    Specific Gravity, Urine 1.027  1.005 - 1.030    pH 6.0  5.0 - 8.0    Glucose,  UA NEGATIVE  NEGATIVE (mg/dL)   Hgb urine dipstick NEGATIVE  NEGATIVE    Bilirubin Urine NEGATIVE  NEGATIVE    Ketones, ur NEGATIVE  NEGATIVE (mg/dL)   Protein, ur NEGATIVE  NEGATIVE (mg/dL)   Urobilinogen, UA 0.2  0.0 - 1.0 (mg/dL)   Nitrite NEGATIVE  NEGATIVE    Leukocytes, UA MODERATE (*) NEGATIVE   POCT PREGNANCY, URINE      Component Value Range   Preg Test, Ur POSITIVE (*) NEGATIVE   URINE MICROSCOPIC-ADD ON      Component Value Range   Squamous Epithelial / LPF MANY (*) RARE    WBC, UA 7-10  <3 (WBC/hpf)   RBC / HPF 0-2  <3 (RBC/hpf)   Bacteria, UA RARE  RARE    Urine-Other MUCOUS PRESENT       No diagnosis found. 1.  Dysuria 2. Pregnant    MDM  No vaginal bleeding or discharge and no abdominal or back pain. She has a history of UTI with same symptoms. No fever. Feel she can be treated for simple UTI.         Rodena Medin, PA-C 03/23/12 1102

## 2012-03-23 NOTE — ED Notes (Signed)
Patient reports dysuria and frequency x 2 days. Reports that she is 2-3 months pregnant as well. Denies abdominal cramping or discharge. LMP-unknown due to [facility] method. Has had pregnancy test but no U/S

## 2012-03-24 LAB — URINE CULTURE
Colony Count: 35000
Culture  Setup Time: 201305251733

## 2012-03-24 NOTE — ED Provider Notes (Signed)
Medical screening examination/treatment/procedure(s) were performed by non-physician practitioner and as supervising physician I was immediately available for consultation/collaboration.   Suzi Roots, MD 03/24/12 [phone]

## 2012-04-01 ENCOUNTER — Encounter (HOSPITAL_COMMUNITY): Payer: Self-pay | Admitting: Emergency Medicine

## 2012-04-01 ENCOUNTER — Emergency Department (HOSPITAL_COMMUNITY)
Admission: EM | Admit: 2012-04-01 | Discharge: 2012-04-02 | Disposition: A | Discharge status: Home / Self Care | Payer: Self-pay | Attending: Emergency Medicine | Admitting: Emergency Medicine

## 2012-04-01 DIAGNOSIS — O99891 Other specified diseases and conditions complicating pregnancy: Secondary | ICD-10-CM | POA: Insufficient documentation

## 2012-04-01 DIAGNOSIS — R109 Unspecified abdominal pain: Secondary | ICD-10-CM | POA: Insufficient documentation

## 2012-04-01 DIAGNOSIS — O99330 Smoking (tobacco) complicating pregnancy, unspecified trimester: Secondary | ICD-10-CM | POA: Insufficient documentation

## 2012-04-01 LAB — URINALYSIS, MICROSCOPIC ONLY
Ketones, ur: NEGATIVE mg/dL
Nitrite: NEGATIVE
Protein, ur: NEGATIVE mg/dL

## 2012-04-01 MED ORDER — IBUPROFEN 600 MG PO TABS: 600.0000 mg | ORAL_TABLET | Freq: Three times a day (TID) | ORAL | Status: AC | PRN

## 2012-04-01 MED ORDER — CEPHALEXIN 250 MG PO CAPS: 250.0000 mg | ORAL_CAPSULE | Freq: Four times a day (QID) | ORAL | Status: AC

## 2012-04-01 MED ORDER — CEPHALEXIN 250 MG PO CAPS
250.0000 mg | ORAL_CAPSULE | Freq: Once | ORAL | Status: AC
  Administered 2012-04-01: 250 mg via ORAL
  Filled 2012-04-01: qty 1

## 2012-04-01 NOTE — Discharge Instructions (Signed)
Flank Pain Flank pain refers to pain that is located on the side of the body between the upper abdomen and the back. It can be caused by many things. CAUSES  Some of the more common causes of flank pain include:  Muscle strain.   Muscle spasms.   A disease of your spine (vertebral disk disease).   A lung infection (pneumonia).   Fluid around your lungs (pulmonary edema).   A kidney infection.   Kidney stones.   A very painful skin rash on only one side of your body (shingles).   Gallbladder disease.  DIAGNOSIS  Blood tests, urine tests, and X-rays may help your caregiver determine what is wrong. TREATMENT  The treatment of pain depends on the cause. Your caregiver will determine what treatment will work best for you. HOME CARE INSTRUCTIONS   Home care will depend on the cause of your pain.   Some medications may help relieve the pain. Take medication for relief of pain as directed by your caregiver.   Tell your caregiver about any changes in your pain.   Follow up with your caregiver.  SEEK IMMEDIATE MEDICAL CARE IF:   Your pain is not controlled with medication.   The pain increases.   You have abdominal pain.   You have shortness of breath.   You have persistent nausea or vomiting.   You have swelling in your abdomen.   You feel faint or pass out.   You have a temperature by mouth above 102 F (38.9 C), not controlled by medicine.  MAKE SURE YOU:   Understand these instructions.   Will watch your condition.   Will get help right away if you are not doing well or get worse.  Document Released: 12/07/2005 Document Revised: 10/05/2011 Document Reviewed: 04/02/2010 [employer] Patient Information 2012 [employer]. 

## 2012-04-01 NOTE — ED Provider Notes (Addendum)
History     CSN: [id number] Arrival date & time 04/01/12  1722  First MD Initiated Contact with Patient 04/01/12 2205    Chief Complaint  Patient presents with  . Flank Pain  . Possible Pregnancy    HPI Pt has been having flank pain for the last 3-4 days.  Pt states she ha had some urinary burning as well.  The symptoms worsen when she tries to urinate.  Pt denies fever. She denies any abdominal pain she denies any vaginal bleeding. The patient is currently pregnant. She is G2 P1. She believes she is about 12 weeks estimated gestational age. She has not had any prenatal care yet. Patient states the pain in her flank also increases somewhat with movement and palpation of her flank area. She is concerned about the possibility of a kidney stone as well. Past Medical History  Diagnosis Date  . Kidney stone   . Pregnant     History reviewed. No pertinent past surgical history.  No family history on file.  History  Substance Use Topics  . Smoking status: Current Everyday Smoker -- 0.5 packs/day    Types: Cigarettes  . Smokeless tobacco: Not on file  . Alcohol Use: Yes     socially    OB History    Grav Para Term Preterm Abortions TAB SAB Ect Mult Living   1               Review of Systems  All other systems reviewed and are negative.    Allergies  Stadol  Home Medications   Current Outpatient Rx  Name Route Sig Dispense Refill  . NITROFURANTOIN MONOHYD MACRO 100 MG PO CAPS Oral Take 1 capsule (100 mg total) by mouth 2 (two) times daily. 10 capsule 0  . PRENATAL MULTIVITAMIN CH Oral Take 1 tablet by mouth daily.    Marland Kitchen PROMETHAZINE HCL 25 MG PO TABS Oral Take 1 tablet (25 mg total) by mouth every 6 (six) hours as needed for nausea. 20 tablet 0  . PROMETHAZINE HCL 25 MG PO TABS Oral Take 1 tablet (25 mg total) by mouth every 6 (six) hours as needed for nausea. 30 tablet 0    BP 95/61  Pulse 82  Temp(Src) 98.1 F (36.7 C) (Oral)  Resp 16  Wt 159 lb (72.122 kg)  SpO2  97%  LMP 12/05/2011  Physical Exam  Nursing note and vitals reviewed. Constitutional: She appears well-developed and well-nourished. No distress.  HENT:  Head: Normocephalic and atraumatic.  Right Ear: External ear normal.  Left Ear: External ear normal.  Eyes: Conjunctivae are normal. Right eye exhibits no discharge. Left eye exhibits no discharge. No scleral icterus.  Neck: Neck supple. No tracheal deviation present.  Cardiovascular: Normal rate, regular rhythm and intact distal pulses.   Pulmonary/Chest: Effort normal and breath sounds normal. No stridor. No respiratory distress. She has no wheezes. She has no rales.  Abdominal: Soft. Bowel sounds are normal. She exhibits no distension. There is no tenderness. There is CVA tenderness (left side). There is no rebound and no guarding.  Musculoskeletal: She exhibits no edema and no tenderness.  Neurological: She is alert. She has normal strength. No sensory deficit. Cranial nerve deficit:  no gross defecits noted. She exhibits normal muscle tone. She displays no seizure activity. Coordination normal.  Skin: Skin is warm and dry. No rash noted.  Psychiatric: She has a normal mood and affect.    ED Course  Procedures (including critical care time)  Labs Reviewed  URINALYSIS, WITH MICROSCOPIC - Abnormal; Notable for the following:    APPearance CLOUDY (*)    Specific Gravity, Urine 1.038 (*)    Leukocytes, UA SMALL (*)    Squamous Epithelial / LPF MANY (*)    Crystals CA OXALATE CRYSTALS (*)    All other components within normal limits   No results found.    MDM  Previous labs were reviewed. The patient had a positive pregnancy test the end of May. She has no abdominal tenderness. She has not had any vaginal bleeding. I doubt ectopic pregnancy. Patient does have some tenderness in the left flank area. The urinalysis shows a few white blood cells but many squamous epithelial cells. With her persistent dysuria I will start her on  another course of antibiotics and sent off a urine culture.  Instructed to followup at the [facility] hospital to establish prenatal care.  Previous culture was reviewed and it showed polymicrobial    Celene Kras, MD 04/01/12 2229  Celene Kras, MD 04/01/12 2237

## 2012-04-01 NOTE — ED Notes (Signed)
Here a couple of weeks ago with urinary tract infection. Completed antibiotic course and started to have abdominal pain on the left side. Pt continues to have burning and frequency when urinating.

## 2012-04-01 NOTE — ED Notes (Addendum)
Pt presenting to ed with c/o left side flank pain x 3-4 days. Pt states was seen her for the same thing x 1-2 weeks ago but she's having the same symptoms. Pt states positive nausea no vomiting. Pt states they think I'm 3 months pregnant.

## 2012-04-03 LAB — URINE CULTURE

## 2013-11-01 ENCOUNTER — Encounter (HOSPITAL_COMMUNITY): Payer: Self-pay | Admitting: Emergency Medicine

## 2013-11-01 ENCOUNTER — Emergency Department (HOSPITAL_COMMUNITY)
Admission: EM | Admit: 2013-11-01 | Discharge: 2013-11-02 | Disposition: A | Discharge status: Home / Self Care | Payer: Medicaid Other | Attending: Emergency Medicine | Admitting: Emergency Medicine

## 2013-11-01 DIAGNOSIS — F17200 Nicotine dependence, unspecified, uncomplicated: Secondary | ICD-10-CM | POA: Insufficient documentation

## 2013-11-01 DIAGNOSIS — Z87442 Personal history of urinary calculi: Secondary | ICD-10-CM | POA: Insufficient documentation

## 2013-11-01 DIAGNOSIS — Z8619 Personal history of other infectious and parasitic diseases: Secondary | ICD-10-CM | POA: Insufficient documentation

## 2013-11-01 DIAGNOSIS — Z3202 Encounter for pregnancy test, result negative: Secondary | ICD-10-CM | POA: Insufficient documentation

## 2013-11-01 DIAGNOSIS — Z79899 Other long term (current) drug therapy: Secondary | ICD-10-CM | POA: Insufficient documentation

## 2013-11-01 DIAGNOSIS — N739 Female pelvic inflammatory disease, unspecified: Secondary | ICD-10-CM

## 2013-11-01 LAB — URINALYSIS, ROUTINE W REFLEX MICROSCOPIC
BILIRUBIN URINE: NEGATIVE
GLUCOSE, UA: NEGATIVE mg/dL
Hgb urine dipstick: NEGATIVE
KETONES UR: NEGATIVE mg/dL
LEUKOCYTES UA: NEGATIVE
NITRITE: NEGATIVE
PH: 8 (ref 5.0–8.0)
Protein, ur: NEGATIVE mg/dL
SPECIFIC GRAVITY, URINE: 1.02 (ref 1.005–1.030)
Urobilinogen, UA: 0.2 mg/dL (ref 0.0–1.0)

## 2013-11-01 LAB — WET PREP, GENITAL: Trich, Wet Prep: NONE SEEN

## 2013-11-01 LAB — POCT PREGNANCY, URINE: Preg Test, Ur: NEGATIVE

## 2013-11-01 MED ORDER — ONDANSETRON 8 MG PO TBDP
8.0000 mg | ORAL_TABLET | Freq: Once | ORAL | Status: AC
  Administered 2013-11-02: 8 mg via ORAL
  Filled 2013-11-01: qty 1

## 2013-11-01 MED ORDER — DOXYCYCLINE HYCLATE 100 MG PO TABS
100.0000 mg | ORAL_TABLET | Freq: Once | ORAL | Status: AC
  Administered 2013-11-02: 100 mg via ORAL
  Filled 2013-11-01: qty 1

## 2013-11-01 MED ORDER — METRONIDAZOLE 500 MG PO TABS
500.0000 mg | ORAL_TABLET | Freq: Once | ORAL | Status: AC
  Administered 2013-11-02: 500 mg via ORAL
  Filled 2013-11-01: qty 1

## 2013-11-01 MED ORDER — CEFTRIAXONE SODIUM 250 MG IJ SOLR
250.0000 mg | Freq: Once | INTRAMUSCULAR | Status: AC
  Administered 2013-11-01: 250 mg via INTRAMUSCULAR
  Filled 2013-11-01: qty 250

## 2013-11-01 NOTE — ED Notes (Signed)
MD at bedside. 

## 2013-11-01 NOTE — ED Notes (Signed)
Patient states that she has had dull aches in her pelvic region. The patient reports that this started on New years. She also has a odor and itching. Patient reports N/V

## 2013-11-01 NOTE — ED Notes (Signed)
POCT Preg resulted neg.

## 2013-11-02 LAB — COMPREHENSIVE METABOLIC PANEL WITH GFR
ALT: 23 U/L (ref 0–35)
AST: 23 U/L (ref 0–37)
Albumin: 3.8 g/dL (ref 3.5–5.2)
Alkaline Phosphatase: 71 U/L (ref 39–117)
BUN: 13 mg/dL (ref 6–23)
CO2: 25 meq/L (ref 19–32)
Calcium: 9 mg/dL (ref 8.4–10.5)
Chloride: 100 meq/L (ref 96–112)
Creatinine, Ser: 0.55 mg/dL (ref 0.50–1.10)
GFR calc Af Amer: >90 mL/min
GFR calc non Af Amer: >90 mL/min
Glucose, Bld: 86 mg/dL (ref 70–99)
Potassium: 4.2 meq/L (ref 3.7–5.3)
Sodium: 137 meq/L (ref 137–147)
Total Bilirubin: 0.2 mg/dL — ABNORMAL LOW (ref 0.3–1.2)
Total Protein: 6.9 g/dL (ref 6.0–8.3)

## 2013-11-02 LAB — CBC WITH DIFFERENTIAL/PLATELET
Basophils Absolute: 0.1 K/uL (ref 0.0–0.1)
Basophils Relative: 0 % (ref 0–1)
Eosinophils Absolute: 0.5 K/uL (ref 0.0–0.7)
Eosinophils Relative: 4 % (ref 0–5)
HCT: 41.2 % (ref 36.0–46.0)
Hemoglobin: 14.3 g/dL (ref 12.0–15.0)
Lymphocytes Relative: 38 % (ref 12–46)
Lymphs Abs: 4.3 K/uL — ABNORMAL HIGH (ref 0.7–4.0)
MCH: 31.6 pg (ref 26.0–34.0)
MCHC: 34.7 g/dL (ref 30.0–36.0)
MCV: 91.2 fL (ref 78.0–100.0)
Monocytes Absolute: 0.7 K/uL (ref 0.1–1.0)
Monocytes Relative: 7 % (ref 3–12)
Neutro Abs: 5.7 K/uL (ref 1.7–7.7)
Neutrophils Relative %: 50 % (ref 43–77)
Platelets: 370 K/uL (ref 150–400)
RBC: 4.52 MIL/uL (ref 3.87–5.11)
RDW: 12.1 % (ref 11.5–15.5)
WBC: 11.3 K/uL — ABNORMAL HIGH (ref 4.0–10.5)

## 2013-11-02 LAB — HEPATITIS PANEL, ACUTE
HCV Ab: NEGATIVE
HEP A IGM: NONREACTIVE
HEP B S AG: NEGATIVE
Hep B C IgM: NONREACTIVE

## 2013-11-02 MED ORDER — ONDANSETRON 8 MG PO TBDP: 8.0000 mg | ORAL_TABLET | Freq: Three times a day (TID) | ORAL | Status: DC | PRN

## 2013-11-02 MED ORDER — TRAMADOL HCL 50 MG PO TABS: 50.0000 mg | ORAL_TABLET | Freq: Four times a day (QID) | ORAL | Status: DC | PRN

## 2013-11-02 MED ORDER — METRONIDAZOLE 500 MG PO TABS: 500.0000 mg | ORAL_TABLET | Freq: Two times a day (BID) | ORAL | Status: AC

## 2013-11-02 MED ORDER — DOXYCYCLINE HYCLATE 100 MG PO CAPS: 100.0000 mg | ORAL_CAPSULE | Freq: Two times a day (BID) | ORAL | Status: AC

## 2013-11-02 NOTE — ED Notes (Signed)
C/o nausea and flank pain. Onset 2 days ago.

## 2013-11-02 NOTE — Discharge Instructions (Signed)
Please take medications as prescribed.  Follow up with your doctor for results of your hepatitis screen.  You will need follow up for your yearly women's exam with PAP.  Your partner will need to be tested and treated prior to engaging in sexual intercourse.   Pelvic Inflammatory Disease Pelvic inflammatory disease (PID) refers to an infection in some or all of the female organs. The infection can be in the uterus, ovaries, fallopian tubes, or the surrounding tissues in the pelvis. PID can cause abdominal or pelvic pain that comes on suddenly (acute pelvic pain). PID is a serious infection because it can lead to lasting (chronic) pelvic pain or the inability to have children (infertile).  CAUSES  The infection is often caused by the normal bacteria found in the vaginal tissues. PID may also be caused by an infection that is spread during sexual contact. PID can also occur following:   The birth of a baby.   A miscarriage.   An abortion.   Major pelvic surgery.   The use of an intrauterine device (IUD).   A sexual assault.  RISK FACTORS Certain factors can put a person at higher risk for PID, such as:  Being younger than 25 years.  Being sexually active at [place] age.  Usingnonbarrier contraception.  Havingmultiple sexual partners.  Having sex with someone who has symptoms of a genital infection.  Using oral contraception. Other times, certain behaviors can increase the possibility of getting PID, such as:  Having sex during your period.  Using a vaginal douche.  Having an intrauterine device (IUD) in place. SYMPTOMS   Abdominal or pelvic pain.   Fever.   Chills.   Abnormal vaginal discharge.  Abnormal uterine bleeding.   Unusual pain shortly after finishing your period. DIAGNOSIS  Your caregiver will choose some of the following methods to make a diagnosis, such as:   Performinga physical exam and history. A pelvic exam typically reveals a very  tender uterus and surrounding pelvis.   Ordering laboratory tests including a pregnancy test, blood tests, and urine test.  Orderingcultures of the vagina and cervix to check for a sexually transmitted infection (STI).  Performing an ultrasound.   Performing a laparoscopic procedure to look inside the pelvis.  TREATMENT   Antibiotic medicines may be prescribed and taken by mouth.   Sexual partners may be treated when the infection is caused by a sexually transmitted disease (STD).   Hospitalization may be needed to give antibiotics intravenously.  Surgery may be needed, but this is rare. It may take weeks until you are completely well. If you are diagnosed with PID, you should also be checked for human immunodeficiency virus (HIV). HOME CARE INSTRUCTIONS   If given, take your antibiotics as directed. Finish the medicine even if you start to feel better.   Only take over-the-counter or prescription medicines for pain, discomfort, or fever as directed by your caregiver.   Do not have sexual intercourse until treatment is completed or as directed by your caregiver. If PID is confirmed, your recent sexual partner(s) will need treatment.   Keep your follow-up appointments. SEEK MEDICAL CARE IF:   You have increased or abnormal vaginal discharge.   You need prescription medicine for your pain.   You vomit.   You cannot take your medicines.   Your partner has an STD.  SEEK IMMEDIATE MEDICAL CARE IF:   You have a fever.   You have increased abdominal or pelvic pain.   You have chills.  You have pain when you urinate.   You are not better after 72 hours following treatment.  MAKE SURE YOU:   Understand these instructions.  Will watch your condition.  Will get help right away if you are not doing well or get worse. Document Released: 10/16/2005 Document Revised: 02/10/2013 Document Reviewed: 10/12/2011 [facility] Patient Information 2014  [place], [place].  Pelvic Pain Pelvic pain is pain felt below the belly button and between your hips. It can be caused by many different things. It is important to get help right away. This is especially true for severe, sharp, or unusual pain that comes on suddenly.  HOME CARE  Only take medicine as told by your doctor.  Rest as told by your doctor.  Eat a healthy diet, such as fruits, vegetables, and lean meats.  Drink enough fluids to keep your pee (urine) clear or pale yellow, or as told.  Avoid sex (intercourse) if it causes pain.  Apply warm or cold packs to your lower belly (abdomen). Use the type of pack that helps the pain.  Avoid situations that cause you stress.  Keep a journal to track your pain. Write down:  When the pain started.  Where it is located.  If there are things that seem to be related to the pain, such as food or your period.  Follow up with your doctor as told. GET HELP RIGHT AWAY IF:   You have heavy bleeding from the vagina.  You have more pelvic pain.  You feel lightheaded or pass out (faint).  You have chills.  You have pain when you pee or have blood in your pee.  You cannot stop having watery poop (diarrhea).  You cannot stop throwing up (vomiting).  You have a fever or lasting symptoms for more than 3 days.  You have a fever and your symptoms suddenly get worse.  You are being physically or sexually abused.  Your medicine does not help your pain.  You have fluid (discharge) coming from your vagina that is not normal. MAKE SURE YOU:  Understand these instructions.  Will watch your condition.  Will get help if you are not doing well or get worse. Document Released: 04/03/2008 Document Revised: 04/16/2012 Document Reviewed: 02/05/2012 [facility] Patient Information 2014 [place], [place].

## 2013-11-02 NOTE — ED Provider Notes (Signed)
CSN: [id number]     Arrival date & time 11/01/13  1935 History   First MD Initiated Contact with Patient 11/01/13 2301     Chief Complaint  Patient presents with  . Abdominal Pain  . Vaginal Itching   (Consider location/radiation/quality/duration/timing/severity/associated sxs/prior Treatment) HPI 28 year old female presents to emergency department with complaint of dull pain in her left lower quadrant ongoing for the last 3 days, along with dry, itchy vagina with some discharge.  She's had some nausea and vomiting.  No fever or chills.  Patient is overall poor historian.  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  She is supposed to have followup and workup for possible STDs, but has not yet done.  This.  She is requesting STD evaluation,.  She reports that she is due for a Pap smear, but has not been able to arrange with her GYN. Past Medical History  Diagnosis Date  . Kidney stone   . Pregnant    History reviewed. No pertinent past surgical history. History reviewed. No pertinent family history. History  Substance Use Topics  . Smoking status: Current Every Day Smoker -- 0.50 packs/day    Types: Cigarettes  . Smokeless tobacco: Not on file  . Alcohol Use: Yes     Comment: socially   OB History   Grav Para Term Preterm Abortions TAB SAB Ect Mult Living   1              Review of Systems  See History of Present Illness; otherwise all other systems are reviewed and negative Allergies  Stadol  Home Medications   Current Outpatient Rx  Name  Route  Sig  Dispense  Refill  . amphetamine-dextroamphetamine (ADDERALL) 20 MG tablet   Oral   Take 20 mg by mouth 2 (two) times daily.         Marland Kitchen aspirin-acetaminophen-caffeine (EQ HEADACHE RELIEF) 250-250-65 MG per tablet   Oral   Take 1 tablet by mouth every 6 (six) hours as needed for headache.         . Multiple Vitamin (MULTIVITAMIN WITH MINERALS) TABS tablet  Oral   Take 1 tablet by mouth daily.         Marland Kitchen venlafaxine XR (EFFEXOR-XR) 75 MG 24 hr capsule   Oral   Take 75 mg by mouth every evening.          BP 110/68  Pulse 114  Temp(Src) 98.7 F (37.1 C) (Oral)  Resp 18  Ht 5\' 8"  (1.727 m)  Wt 135 lb (61.236 kg)  BMI 20.53 kg/m2  SpO2 100%  LMP 11/01/2013  Breastfeeding? Unknown Physical Exam  Nursing note and vitals reviewed. Constitutional: She is oriented to person, place, and time. She appears well-developed and well-nourished. No distress.  HENT:  Head: Normocephalic and atraumatic.  Nose: Nose normal.  Mouth/Throat: Oropharynx is clear and moist.  Eyes: Conjunctivae and EOM are normal. Pupils are equal, round, and reactive to light.  Neck: Normal range of motion. Neck supple. No JVD present. No tracheal deviation present. No thyromegaly present.  Cardiovascular: Normal rate, regular rhythm, normal heart sounds and intact distal pulses.  Exam reveals no gallop and no friction rub.   No murmur heard. Pulmonary/Chest: Effort normal and breath sounds normal. No stridor. No respiratory distress. She has no wheezes. She has no rales. She exhibits no tenderness.  Abdominal: Soft. Bowel sounds are normal. She exhibits no  distension and no mass. There is tenderness (mild suprapubic tenderness). There is no rebound and no guarding.  Genitourinary: Vaginal discharge found.  External genitalia normal Vagina with thick, yellow-brown, pink discharge Cervix closed no lesions No cervical motion tenderness Adnexa palpated, no masses or tenderness noted Bladder palpated no tenderness Uterus palpated no masses, but moderate tenderness    Musculoskeletal: Normal range of motion. She exhibits no edema and no tenderness.  Lymphadenopathy:    She has no cervical adenopathy.  Neurological: She is alert and oriented to person, place, and time. She exhibits normal muscle tone. Coordination normal.  Skin: Skin is warm and dry. No rash noted. No  erythema. No pallor.  Psychiatric: She has a normal mood and affect. Her behavior is normal. Judgment and thought content normal.    ED Course  Procedures (including critical care time) Labs Review Labs Reviewed  WET PREP, GENITAL - Abnormal; Notable for the following:    Yeast Wet Prep HPF POC RARE (*)    Clue Cells Wet Prep HPF POC MANY (*)    WBC, Wet Prep HPF POC MODERATE (*)    All other components within normal limits  COMPREHENSIVE METABOLIC PANEL - Abnormal; Notable for the following:    Total Bilirubin 0.2 (*)    All other components within normal limits  CBC WITH DIFFERENTIAL - Abnormal; Notable for the following:    WBC 11.3 (*)    Lymphs Abs 4.3 (*)    All other components within normal limits  GC/CHLAMYDIA PROBE AMP  URINALYSIS, ROUTINE W REFLEX MICROSCOPIC  HEPATITIS PANEL, ACUTE  POCT PREGNANCY, URINE   Imaging Review No results found.  EKG Interpretation   None       MDM   1. PID (pelvic inflammatory disease)    28 year old female with lower abdominal pain, profuse vaginal discharge, and uterine tenderness.  I am concern for PID.  We'll place her on Flagyl and doxycycline.  She's been advised that her partner needs treated and tested, as well.  No sexual contact until finished with antibiotics.  Patient instructed that she will need a Pap smear, as it is not able to be completed in the emergency department.  Cultures for GC and Chlamydia have been completed.  She will need further workup for HIV and syphilis which can be done in the ER.  Hepatitis panel has been drawn, will need to be followed up with by her primary care Dr.    Olivia Mackielga M Jonnatan Hanners, MD 11/02/13 [phone]

## 2013-11-03 LAB — GC/CHLAMYDIA PROBE AMP
CT PROBE, AMP APTIMA: NEGATIVE
GC PROBE AMP APTIMA: NEGATIVE

## 2014-02-15 ENCOUNTER — Emergency Department (HOSPITAL_COMMUNITY)
Admission: EM | Admit: 2014-02-15 | Discharge: 2014-02-15 | Disposition: A | Discharge status: Home / Self Care | Payer: Medicaid Other | Attending: Emergency Medicine | Admitting: Emergency Medicine

## 2014-02-15 ENCOUNTER — Emergency Department (HOSPITAL_COMMUNITY): Payer: Medicaid Other

## 2014-02-15 ENCOUNTER — Encounter (HOSPITAL_COMMUNITY): Payer: Self-pay | Admitting: Emergency Medicine

## 2014-02-15 DIAGNOSIS — Z87442 Personal history of urinary calculi: Secondary | ICD-10-CM | POA: Insufficient documentation

## 2014-02-15 DIAGNOSIS — F17200 Nicotine dependence, unspecified, uncomplicated: Secondary | ICD-10-CM | POA: Insufficient documentation

## 2014-02-15 DIAGNOSIS — Z8719 Personal history of other diseases of the digestive system: Secondary | ICD-10-CM | POA: Insufficient documentation

## 2014-02-15 DIAGNOSIS — N83202 Unspecified ovarian cyst, left side: Secondary | ICD-10-CM

## 2014-02-15 DIAGNOSIS — Z7982 Long term (current) use of aspirin: Secondary | ICD-10-CM | POA: Insufficient documentation

## 2014-02-15 DIAGNOSIS — N83201 Unspecified ovarian cyst, right side: Secondary | ICD-10-CM

## 2014-02-15 DIAGNOSIS — Z3202 Encounter for pregnancy test, result negative: Secondary | ICD-10-CM | POA: Insufficient documentation

## 2014-02-15 DIAGNOSIS — Z79899 Other long term (current) drug therapy: Secondary | ICD-10-CM | POA: Insufficient documentation

## 2014-02-15 DIAGNOSIS — N83209 Unspecified ovarian cyst, unspecified side: Secondary | ICD-10-CM | POA: Insufficient documentation

## 2014-02-15 DIAGNOSIS — R109 Unspecified abdominal pain: Secondary | ICD-10-CM

## 2014-02-15 HISTORY — DX: Calculus of gallbladder without cholecystitis without obstruction: K80.20

## 2014-02-15 LAB — URINALYSIS, ROUTINE W REFLEX MICROSCOPIC
BILIRUBIN URINE: NEGATIVE
Glucose, UA: NEGATIVE mg/dL
Hgb urine dipstick: NEGATIVE
KETONES UR: NEGATIVE mg/dL
Leukocytes, UA: NEGATIVE
Nitrite: NEGATIVE
Protein, ur: NEGATIVE mg/dL
SPECIFIC GRAVITY, URINE: 1.012 (ref 1.005–1.030)
UROBILINOGEN UA: 0.2 mg/dL (ref 0.0–1.0)
pH: 8 (ref 5.0–8.0)

## 2014-02-15 LAB — CBC WITH DIFFERENTIAL/PLATELET
Basophils Absolute: 0.1 10*3/uL (ref 0.0–0.1)
Basophils Relative: 0 % (ref 0–1)
Eosinophils Absolute: 0.5 10*3/uL (ref 0.0–0.7)
Eosinophils Relative: 4 % (ref 0–5)
HEMATOCRIT: 36.8 % (ref 36.0–46.0)
Hemoglobin: 13.2 g/dL (ref 12.0–15.0)
LYMPHS PCT: 38 % (ref 12–46)
Lymphs Abs: 4.2 10*3/uL — ABNORMAL HIGH (ref 0.7–4.0)
MCH: 31.6 pg (ref 26.0–34.0)
MCHC: 35.9 g/dL (ref 30.0–36.0)
MCV: 88 fL (ref 78.0–100.0)
Monocytes Absolute: 1.1 10*3/uL — ABNORMAL HIGH (ref 0.1–1.0)
Monocytes Relative: 9 % (ref 3–12)
Neutro Abs: 5.3 10*3/uL (ref 1.7–7.7)
Neutrophils Relative %: 48 % (ref 43–77)
Platelets: 346 10*3/uL (ref 150–400)
RBC: 4.18 MIL/uL (ref 3.87–5.11)
RDW: 12 % (ref 11.5–15.5)
WBC: 11.1 10*3/uL — AB (ref 4.0–10.5)

## 2014-02-15 LAB — WET PREP, GENITAL
Clue Cells Wet Prep HPF POC: NONE SEEN
Trich, Wet Prep: NONE SEEN
Yeast Wet Prep HPF POC: NONE SEEN

## 2014-02-15 LAB — COMPREHENSIVE METABOLIC PANEL
ALT: 30 U/L (ref 0–35)
AST: 27 U/L (ref 0–37)
Albumin: 3.6 g/dL (ref 3.5–5.2)
Alkaline Phosphatase: 68 U/L (ref 39–117)
BUN: 10 mg/dL (ref 6–23)
CALCIUM: 9.3 mg/dL (ref 8.4–10.5)
CO2: 24 meq/L (ref 19–32)
Chloride: 101 mEq/L (ref 96–112)
Creatinine, Ser: 0.59 mg/dL (ref 0.50–1.10)
GLUCOSE: 89 mg/dL (ref 70–99)
Potassium: 3.8 mEq/L (ref 3.7–5.3)
Sodium: 138 mEq/L (ref 137–147)
Total Bilirubin: <0.2 mg/dL — ABNORMAL LOW (ref 0.3–1.2)
Total Protein: 6.8 g/dL (ref 6.0–8.3)

## 2014-02-15 LAB — LIPASE, BLOOD: LIPASE: 33 U/L (ref 11–59)

## 2014-02-15 LAB — I-STAT CG4 LACTIC ACID, ED: Lactic Acid, Venous: 0.42 mmol/L — ABNORMAL LOW (ref 0.5–2.2)

## 2014-02-15 LAB — PREGNANCY, URINE: Preg Test, Ur: NEGATIVE

## 2014-02-15 MED ORDER — SODIUM CHLORIDE 0.9 % IV BOLUS (SEPSIS)
1000.0000 mL | Freq: Once | INTRAVENOUS | Status: AC
  Administered 2014-02-15: 1000 mL via INTRAVENOUS

## 2014-02-15 MED ORDER — ONDANSETRON HCL 4 MG/2ML IJ SOLN
4.0000 mg | INTRAMUSCULAR | Status: AC
  Administered 2014-02-15: 4 mg via INTRAVENOUS
  Filled 2014-02-15: qty 2

## 2014-02-15 MED ORDER — HYDROMORPHONE HCL PF 1 MG/ML IJ SOLN
1.0000 mg | Freq: Once | INTRAMUSCULAR | Status: AC
  Administered 2014-02-15: 1 mg via INTRAVENOUS
  Filled 2014-02-15 (×2): qty 1

## 2014-02-15 MED ORDER — OXYCODONE-ACETAMINOPHEN 5-325 MG PO TABS
2.0000 | ORAL_TABLET | Freq: Once | ORAL | Status: AC
  Administered 2014-02-15: 2 via ORAL
  Filled 2014-02-15: qty 2

## 2014-02-15 MED ORDER — IOHEXOL 300 MG/ML  SOLN
50.0000 mL | Freq: Once | INTRAMUSCULAR | Status: AC | PRN
  Administered 2014-02-15: 50 mL via ORAL

## 2014-02-15 MED ORDER — ONDANSETRON HCL 4 MG PO TABS: 4.0000 mg | ORAL_TABLET | Freq: Four times a day (QID) | ORAL | Status: DC

## 2014-02-15 MED ORDER — IBUPROFEN 600 MG PO TABS: 600.0000 mg | ORAL_TABLET | Freq: Four times a day (QID) | ORAL | Status: DC | PRN

## 2014-02-15 MED ORDER — MORPHINE SULFATE 4 MG/ML IJ SOLN
4.0000 mg | Freq: Once | INTRAMUSCULAR | Status: AC
  Administered 2014-02-15: 4 mg via INTRAVENOUS
  Filled 2014-02-15: qty 1

## 2014-02-15 MED ORDER — HYDROCODONE-ACETAMINOPHEN 5-325 MG PO TABS: 1.0000 | ORAL_TABLET | Freq: Four times a day (QID) | ORAL | Status: DC | PRN

## 2014-02-15 MED ORDER — IOHEXOL 300 MG/ML  SOLN
100.0000 mL | Freq: Once | INTRAMUSCULAR | Status: AC | PRN
  Administered 2014-02-15: 100 mL via INTRAVENOUS

## 2014-02-15 NOTE — ED Notes (Signed)
Pt c/o L sided abd pain x 3 days, +n/v/d. Denies dysuria

## 2014-02-15 NOTE — Discharge Instructions (Signed)
Recommend ibuprofen for pain control and Zofran as needed for nausea/vomiting. You may take Norco as prescribed for severe pain. Followup with your OB/GYN or women's outpatient clinic for further evaluation of your symptoms. Return if symptoms worsen.  Abdominal Pain, Women Abdominal (stomach, pelvic, or belly) pain can be caused by many things. It is important to tell your doctor:  The location of the pain.  Does it come and go or is it present all the time?  Are there things that start the pain (eating certain foods, exercise)?  Are there other symptoms associated with the pain (fever, nausea, vomiting, diarrhea)? All of this is helpful to know when trying to find the cause of the pain. CAUSES   Stomach: virus or bacteria infection, or ulcer.  Intestine: appendicitis (inflamed appendix), regional ileitis (Crohn's disease), ulcerative colitis (inflamed colon), irritable bowel syndrome, diverticulitis (inflamed diverticulum of the colon), or cancer of the stomach or intestine.  Gallbladder disease or stones in the gallbladder.  Kidney disease, kidney stones, or infection.  Pancreas infection or cancer.  Fibromyalgia (pain disorder).  Diseases of the female organs:  Uterus: fibroid (non-cancerous) tumors or infection.  Fallopian tubes: infection or tubal pregnancy.  Ovary: cysts or tumors.  Pelvic adhesions (scar tissue).  Endometriosis (uterus lining tissue growing in the pelvis and on the pelvic organs).  Pelvic congestion syndrome (female organs filling up with blood just before the menstrual period).  Pain with the menstrual period.  Pain with ovulation (producing an egg).  Pain with an IUD (intrauterine device, birth control) in the uterus.  Cancer of the female organs.  Functional pain (pain not caused by a disease, may improve without treatment).  Psychological pain.  Depression. DIAGNOSIS  Your doctor will decide the seriousness of your pain by doing an  examination.  Blood tests.  X-rays.  Ultrasound.  CT scan (computed tomography, special type of X-ray).  MRI (magnetic resonance imaging).  Cultures, for infection.  Barium enema (dye inserted in the large intestine, to better view it with X-rays).  Colonoscopy (looking in intestine with a lighted tube).  Laparoscopy (minor surgery, looking in abdomen with a lighted tube).  Major abdominal exploratory surgery (looking in abdomen with a large incision). TREATMENT  The treatment will depend on the cause of the pain.   Many cases can be observed and treated at home.  Over-the-counter medicines recommended by your caregiver.  Prescription medicine.  Antibiotics, for infection.  Birth control pills, for painful periods or for ovulation pain.  Hormone treatment, for endometriosis.  Nerve blocking injections.  Physical therapy.  Antidepressants.  Counseling with a psychologist or psychiatrist.  Minor or major surgery. HOME CARE INSTRUCTIONS   Do not take laxatives, unless directed by your caregiver.  Take over-the-counter pain medicine only if ordered by your caregiver. Do not take aspirin because it can cause an upset stomach or bleeding.  Try a clear liquid diet (broth or water) as ordered by your caregiver. Slowly move to a bland diet, as tolerated, if the pain is related to the stomach or intestine.  Have a thermometer and take your temperature several times a day, and record it.  Bed rest and sleep, if it helps the pain.  Avoid sexual intercourse, if it causes pain.  Avoid stressful situations.  Keep your follow-up appointments and tests, as your caregiver orders.  If the pain does not go away with medicine or surgery, you may try:  Acupuncture.  Relaxation exercises (yoga, meditation).  Group therapy.  Counseling. SEEK  MEDICAL CARE IF:   You notice certain foods cause stomach pain.  Your home care treatment is not helping your pain.  You  need stronger pain medicine.  You want your IUD removed.  You feel faint or lightheaded.  You develop nausea and vomiting.  You develop a rash.  You are having side effects or an allergy to your medicine. SEEK IMMEDIATE MEDICAL CARE IF:   Your pain does not go away or gets worse.  You have a fever.  Your pain is felt only in portions of the abdomen. The right side could possibly be appendicitis. The left lower portion of the abdomen could be colitis or diverticulitis.  You are passing blood in your stools (bright red or black tarry stools, with or without vomiting).  You have blood in your urine.  You develop chills, with or without a fever.  You pass out. MAKE SURE YOU:   Understand these instructions.  Will watch your condition.  Will get help right away if you are not doing well or get worse. Document Released: 08/13/2007 Document Revised: 01/08/2012 Document Reviewed: 09/02/2009 [facility] Patient Information 2014 [employer], [place]. Ovarian Cyst An ovarian cyst is a sac filled with fluid or blood. This sac is attached to the ovary. Some cysts go away on their own. Other cysts need treatment.  HOME CARE   Only take medicine as told by your doctor.  Follow up with your doctor as told.  Get regular pelvic exams and Pap tests. GET HELP IF:  Your periods are late, not regular, or painful.  You stop having periods.  Your belly (abdominal) or pelvic pain does not go away.  Your belly becomes large or puffy (swollen).  You have a hard time peeing (totally emptying your bladder).  You have pressure on your bladder.  You have pain during sex.  You feel fullness, pressure, or discomfort in your belly.  You lose weight for no reason.  You feel sick most of the time.  You have a hard time pooping (constipation).  You do not feel like eating.  You develop pimples (acne).  You have an increase in hair on your body and face.  You are gaining weight for no  reason.  You think you are pregnant. GET HELP RIGHT AWAY IF:   Your belly pain gets worse.  You feel sick to your stomach (nauseous), and you throw up (vomit).  You have a fever that comes on fast.  You have belly pain while pooping (bowel movement).  Your periods are heavier than usual. MAKE SURE YOU:   Understand these instructions.  Will watch your condition.  Will get help right away if you are not doing well or get worse. Document Released: 04/03/2008 Document Revised: 08/06/2013 Document Reviewed: 06/23/2013 [facility] Patient Information 2014 [place]ExitCare, [place].

## 2014-02-15 NOTE — ED Provider Notes (Signed)
CSN: [id number]     Arrival date & time 02/15/14  0106 History   First MD Initiated Contact with Patient 02/15/14 [phone]     Chief Complaint  Patient presents with  . Abdominal Pain    (Consider location/radiation/quality/duration/timing/severity/associated sxs/prior Treatment) HPI Comments: Patient is a 28 year old female with a history of kidney stones and gallstones who presents to the emergency department for abdominal pain. Patient states the pain has been present in her left lower abdomen over the last 3 days. Pain has been constant and nonradiating without modifying factors. Patient endorses associated nausea and 4 episodes of nonbloody/nonbilious emesis for the last 48 hours. Patient states her last bowel movement was within the past 24 hours and was normal, without blood. She denies associated fever, chest pain, shortness of breath, hematemesis, melena, hematochezia, dysuria or hematuria, vaginal bleeding or discharge, numbness/tingling, and weakness. Patient is sexually active with one partner. She denies the use of barrier protection. She denies a concern for STDs. No hx of abdominal surgeries.  Patient is a 28 y.o. female presenting with abdominal pain. The history is provided by the patient. No language interpreter was used.  Abdominal Pain Associated symptoms: nausea and vomiting   Associated symptoms: no chest pain, no dysuria, no fever, no hematuria, no shortness of breath, no vaginal bleeding and no vaginal discharge     Past Medical History  Diagnosis Date  . Kidney stone   . Pregnant   . Gallstones    Past Surgical History  Procedure Laterality Date  . Lithotripsy     No family history on file. History  Substance Use Topics  . Smoking status: Current Every Day Smoker -- 0.50 packs/day    Types: Cigarettes  . Smokeless tobacco: Not on file  . Alcohol Use: Yes     Comment: socially   OB History   Grav Para Term Preterm Abortions TAB SAB Ect Mult Living   1                Review of Systems  Constitutional: Negative for fever.  Respiratory: Negative for shortness of breath.   Cardiovascular: Negative for chest pain.  Gastrointestinal: Positive for nausea, vomiting and abdominal pain.  Genitourinary: Negative for dysuria, hematuria, vaginal bleeding and vaginal discharge.  Neurological: Negative for weakness and numbness.  All other systems reviewed and are negative.     Allergies  Stadol  Home Medications   Prior to Admission medications   Medication Sig Start Date End Date Taking? Authorizing Provider  amphetamine-dextroamphetamine (ADDERALL) 20 MG tablet Take 20 mg by mouth 2 (two) times daily.   Yes Historical Provider, MD  aspirin-acetaminophen-caffeine (EQ HEADACHE RELIEF) 250-250-65 MG per tablet Take 1 tablet by mouth every 6 (six) hours as needed for headache.   Yes Historical Provider, MD  Multiple Vitamin (MULTIVITAMIN WITH MINERALS) TABS tablet Take 1 tablet by mouth daily.   Yes Historical Provider, MD  venlafaxine XR (EFFEXOR-XR) 75 MG 24 hr capsule Take 75 mg by mouth every evening.   Yes Historical Provider, MD   BP 113/79  Pulse 83  Temp(Src) 98.9 F (37.2 C) (Oral)  Resp 16  Ht 5\' 7"  (1.702 m)  Wt 136 lb (61.689 kg)  BMI 21.30 kg/m2  SpO2 100%  Breastfeeding? No  Physical Exam  Nursing note and vitals reviewed. Constitutional: She is oriented to person, place, and time. She appears well-developed and well-nourished. No distress.  HENT:  Head: Normocephalic and atraumatic.  Eyes: Conjunctivae and EOM are normal. No  scleral icterus.  Neck: Normal range of motion.  Cardiovascular: Normal rate, regular rhythm and normal heart sounds.   Pulmonary/Chest: Effort normal and breath sounds normal. No respiratory distress. She has no wheezes. She has no rales.  Abdominal: Soft. Normal appearance. She exhibits no distension and no mass. There is tenderness in the left lower quadrant. There is no rebound, no guarding, no  tenderness at McBurney's point and negative Murphy's sign.    No peritoneal signs  Genitourinary: Vagina normal. There is no rash, tenderness, lesion or injury on the right labia. There is no rash, tenderness, lesion or injury on the left labia. Uterus is not tender. Cervix exhibits no motion tenderness and no friability. Right adnexum displays no mass, no tenderness and no fullness. Left adnexum displays no mass, no tenderness and no fullness.  Musculoskeletal: Normal range of motion.  Neurological: She is alert and oriented to person, place, and time.  Skin: Skin is warm and dry. No rash noted. She is not diaphoretic. No erythema. No pallor.  Psychiatric: She has a normal mood and affect. Her behavior is normal.    ED Course  Procedures (including critical care time) Labs Review Labs Reviewed  WET PREP, GENITAL - Abnormal; Notable for the following:    WBC, Wet Prep HPF POC FEW (*)    All other components within normal limits  CBC WITH DIFFERENTIAL - Abnormal; Notable for the following:    WBC 11.1 (*)    Lymphs Abs 4.2 (*)    Monocytes Absolute 1.1 (*)    All other components within normal limits  COMPREHENSIVE METABOLIC PANEL - Abnormal; Notable for the following:    Total Bilirubin <0.2 (*)    All other components within normal limits  I-STAT CG4 LACTIC ACID, ED - Abnormal; Notable for the following:    Lactic Acid, Venous 0.42 (*)    All other components within normal limits  GC/CHLAMYDIA PROBE AMP  URINALYSIS, ROUTINE W REFLEX MICROSCOPIC  PREGNANCY, URINE  LIPASE, BLOOD    Imaging Review Ct Abdomen Pelvis W Contrast  02/15/2014   CLINICAL DATA:  Left lower quadrant abdominal pain for 3 days. Nausea, vomiting, diarrhea.  EXAM: CT ABDOMEN AND PELVIS WITH CONTRAST  TECHNIQUE: Multidetector CT imaging of the abdomen and pelvis was performed using the standard protocol following bolus administration of intravenous contrast.  CONTRAST:  50mL OMNIPAQUE IOHEXOL 300 MG/ML SOLN,  100mL OMNIPAQUE IOHEXOL 300 MG/ML SOLN  COMPARISON:  US GALLBLADDER-BILIARY (RUQ) dated 02/21/2013; CT ABD/PELVIS W CM dated 05/05/2010  FINDINGS: Focal infiltration or atelectasis in the lung bases posteriorly, greater on the right. The liver, spleen, pancreas, gallbladder, adrenal glands, kidneys, abdominal aorta, inferior vena cava, and retroperitoneal lymph nodes are unremarkable. The stomach is filled with food and contrast material. The small bowel are not abnormally distended. The colon is stool filled without abnormal distention. No free air or free fluid.  Pelvis: Uterus is anteverted without enlargement. Cysts in the ovaries, largest on the right measuring 3 x 4.3 cm. These are likely to be functional. No free fluid or loculated fluid collections in the pelvis. The appendix is normal. No evidence of diverticulitis. The no destructive bone lesions.  IMPRESSION: No definite acute process in the abdomen or pelvis. Focal atelectasis or infiltration in the lung bases. Probable physiologic ovarian cysts.   Electronically Signed   By: Burman NievesWilliam  Stevens M.D.   On: 02/15/2014 05:34     EKG Interpretation None      MDM   Final diagnoses:  Abdominal pain  Ovarian cyst, bilateral    Patient is a 28 y/o female who presents for abdominal pain x 3 days. She is well, nontoxic appearing, hemodynamically stable, and afebrile. Patient with focal TTP in LLQ on physical exam. No evidence of peritonitis; abdomen soft without masses. Labs and UA unremarkable today. No adnexal tenderness, CMT or other significant findings on GU exam. Wet prep only showing few WBCs. Lactate WNL.   Given persistence and perceived severity of pain on palpation, CT ordered for further evaluation of symptoms. CT today shows no definite acute process in the abdomen or pelvis. Patient is shown to have b/l ovarian cysts; R>L. No evidence of diverticulitis. Possible that pain today is secondary to ovarian cysts visualized on CT.  Pain  controlled with Morphine and Dilaudid; patient transitioned to PO pain medicine with good control of abdominal pain symptoms. Do not believe further emergent work up is indicated, but have advised OBGYN follow up. Will prescribe Motrin and Zofran for pain control and Norco for breakthrough pain. Return precautions provided and imaging/labs reviewed. Patient agreeable to plan with no unaddressed concerns.   Filed Vitals:   02/15/14 0112 02/15/14 0232 02/15/14 0631  BP: 110/77 113/79 106/70  Pulse: 94 83 86  Temp: 98.9 F (37.2 C)    TempSrc: Oral    Resp: 16  17  Height: 5\' 7"  (1.702 m)    Weight: 136 lb (61.689 kg)    SpO2: 99% 100% 98%       Antony Madura, PA-C 02/17/14 1949

## 2014-02-16 LAB — GC/CHLAMYDIA PROBE AMP
CT Probe RNA: NEGATIVE
GC PROBE AMP APTIMA: NEGATIVE

## 2014-02-18 NOTE — ED Provider Notes (Signed)
Medical screening examination/treatment/procedure(s) were performed by non-physician practitioner and as supervising physician I was immediately available for consultation/collaboration.   EKG Interpretation None       Derwood KaplanAnkit Abigale Dorow, MD 02/18/14 2257

## 2014-05-22 ENCOUNTER — Encounter (HOSPITAL_COMMUNITY): Payer: Self-pay | Admitting: Emergency Medicine

## 2014-05-22 ENCOUNTER — Emergency Department (HOSPITAL_COMMUNITY)
Admission: EM | Admit: 2014-05-22 | Discharge: 2014-05-22 | Disposition: A | Discharge status: Home / Self Care | Payer: Medicaid Other | Attending: Emergency Medicine | Admitting: Emergency Medicine

## 2014-05-22 DIAGNOSIS — Z3202 Encounter for pregnancy test, result negative: Secondary | ICD-10-CM | POA: Diagnosis not present

## 2014-05-22 DIAGNOSIS — Z8719 Personal history of other diseases of the digestive system: Secondary | ICD-10-CM | POA: Diagnosis not present

## 2014-05-22 DIAGNOSIS — IMO0002 Reserved for concepts with insufficient information to code with codable children: Secondary | ICD-10-CM | POA: Diagnosis not present

## 2014-05-22 DIAGNOSIS — Z87442 Personal history of urinary calculi: Secondary | ICD-10-CM | POA: Insufficient documentation

## 2014-05-22 DIAGNOSIS — Z79899 Other long term (current) drug therapy: Secondary | ICD-10-CM | POA: Diagnosis not present

## 2014-05-22 DIAGNOSIS — R109 Unspecified abdominal pain: Secondary | ICD-10-CM | POA: Insufficient documentation

## 2014-05-22 DIAGNOSIS — F17200 Nicotine dependence, unspecified, uncomplicated: Secondary | ICD-10-CM | POA: Insufficient documentation

## 2014-05-22 DIAGNOSIS — N949 Unspecified condition associated with female genital organs and menstrual cycle: Secondary | ICD-10-CM | POA: Diagnosis not present

## 2014-05-22 DIAGNOSIS — N938 Other specified abnormal uterine and vaginal bleeding: Secondary | ICD-10-CM | POA: Insufficient documentation

## 2014-05-22 DIAGNOSIS — N925 Other specified irregular menstruation: Secondary | ICD-10-CM | POA: Diagnosis not present

## 2014-05-22 DIAGNOSIS — N898 Other specified noninflammatory disorders of vagina: Secondary | ICD-10-CM | POA: Diagnosis present

## 2014-05-22 HISTORY — DX: Unspecified ovarian cyst, unspecified side: N83.209

## 2014-05-22 LAB — URINALYSIS, ROUTINE W REFLEX MICROSCOPIC
BILIRUBIN URINE: NEGATIVE
GLUCOSE, UA: NEGATIVE mg/dL
HGB URINE DIPSTICK: NEGATIVE
KETONES UR: NEGATIVE mg/dL
Leukocytes, UA: NEGATIVE
Nitrite: NEGATIVE
Protein, ur: NEGATIVE mg/dL
Specific Gravity, Urine: 1.019 (ref 1.005–1.030)
UROBILINOGEN UA: 0.2 mg/dL (ref 0.0–1.0)
pH: 5.5 (ref 5.0–8.0)

## 2014-05-22 LAB — CBC WITH DIFFERENTIAL/PLATELET
Basophils Absolute: 0.1 10*3/uL (ref 0.0–0.1)
Basophils Relative: 1 % (ref 0–1)
EOS ABS: 0.6 10*3/uL (ref 0.0–0.7)
EOS PCT: 6 % — AB (ref 0–5)
HCT: 39.3 % (ref 36.0–46.0)
HEMOGLOBIN: 13.4 g/dL (ref 12.0–15.0)
LYMPHS ABS: 2.9 10*3/uL (ref 0.7–4.0)
Lymphocytes Relative: 29 % (ref 12–46)
MCH: 31 pg (ref 26.0–34.0)
MCHC: 34.1 g/dL (ref 30.0–36.0)
MCV: 91 fL (ref 78.0–100.0)
MONOS PCT: 8 % (ref 3–12)
Monocytes Absolute: 0.8 10*3/uL (ref 0.1–1.0)
Neutro Abs: 5.8 10*3/uL (ref 1.7–7.7)
Neutrophils Relative %: 58 % (ref 43–77)
Platelets: 378 10*3/uL (ref 150–400)
RBC: 4.32 MIL/uL (ref 3.87–5.11)
RDW: 12.5 % (ref 11.5–15.5)
WBC: 10.1 10*3/uL (ref 4.0–10.5)

## 2014-05-22 LAB — WET PREP, GENITAL
Trich, Wet Prep: NONE SEEN
WBC WET PREP: NONE SEEN
Yeast Wet Prep HPF POC: NONE SEEN

## 2014-05-22 LAB — BASIC METABOLIC PANEL
Anion gap: 9 (ref 5–15)
BUN: 16 mg/dL (ref 6–23)
CALCIUM: 9.1 mg/dL (ref 8.4–10.5)
CO2: 25 mEq/L (ref 19–32)
CREATININE: 0.62 mg/dL (ref 0.50–1.10)
Chloride: 104 mEq/L (ref 96–112)
GFR calc non Af Amer: >90 mL/min (ref >90)
GLUCOSE: 97 mg/dL (ref 70–99)
Potassium: 4.6 mEq/L (ref 3.7–5.3)
Sodium: 138 mEq/L (ref 137–147)

## 2014-05-22 LAB — POC URINE PREG, ED: PREG TEST UR: NEGATIVE

## 2014-05-22 MED ORDER — HYDROCODONE-ACETAMINOPHEN 5-325 MG PO TABS: 1.0000 | ORAL_TABLET | Freq: Four times a day (QID) | ORAL | Status: DC | PRN

## 2014-05-22 NOTE — ED Provider Notes (Signed)
CSN: [id number]     Arrival date & time 05/22/14  1742 History   First MD Initiated Contact with Patient 05/22/14 2032     Chief Complaint  Patient presents with  . Vaginal Bleeding     (Consider location/radiation/quality/duration/timing/severity/associated sxs/prior Treatment) HPI Comments: Patient presents emergency department with chief complaint of vaginal bleeding, and lower abdominal pain. She states that she's been having lower abdominal pain for the past 2-3 weeks. She reports associated dyspareunia, as well as increased vaginal bleeding. She states that she is using 4-5 tampons per day. She states that today she began to feel weak and tired. She denies any fevers, chills, nausea, vomiting, diarrhea, or constipation. She is tried taking ibuprofen with no relief. She does not have an OB/GYN.  The history is provided by the patient. No language interpreter was used.    Past Medical History  Diagnosis Date  . Kidney stone   . Pregnant   . Gallstones   . Ovarian cyst    Past Surgical History  Procedure Laterality Date  . Lithotripsy     No family history on file. History  Substance Use Topics  . Smoking status: Current Every Day Smoker -- 0.50 packs/day    Types: Cigarettes  . Smokeless tobacco: Not on file  . Alcohol Use: Yes     Comment: socially   OB History   Grav Para Term Preterm Abortions TAB SAB Ect Mult Living   1              Review of Systems  Genitourinary: Positive for vaginal bleeding, pelvic pain and dyspareunia.  All other systems reviewed and are negative.     Allergies  Stadol  Home Medications   Prior to Admission medications   Medication Sig Start Date End Date Taking? Authorizing Provider  amphetamine-dextroamphetamine (ADDERALL) 20 MG tablet Take 20 mg by mouth 2 (two) times daily.   Yes Historical Provider, MD  ibuprofen (ADVIL,MOTRIN) 600 MG tablet Take 1 tablet (600 mg total) by mouth every 6 (six) hours as needed. 02/15/14  Yes  Antony Madura, PA-C  Multiple Vitamin (MULTIVITAMIN WITH MINERALS) TABS tablet Take 1 tablet by mouth daily.   Yes Historical Provider, MD  venlafaxine XR (EFFEXOR-XR) 75 MG 24 hr capsule Take 75 mg by mouth every evening.   Yes Historical Provider, MD   BP 113/67  Pulse 80  Temp(Src) 98 F (36.7 C) (Oral)  Resp 20  SpO2 98% Physical Exam  Nursing note and vitals reviewed. Constitutional: She is oriented to person, place, and time. She appears well-developed and well-nourished.  HENT:  Head: Normocephalic and atraumatic.  Eyes: Conjunctivae and EOM are normal. Pupils are equal, round, and reactive to light.  Neck: Normal range of motion. Neck supple.  Cardiovascular: Normal rate and regular rhythm.  Exam reveals no gallop and no friction rub.   No murmur heard. Pulmonary/Chest: Effort normal and breath sounds normal. No respiratory distress. She has no wheezes. She has no rales. She exhibits no tenderness.  Abdominal: Soft. Bowel sounds are normal. She exhibits no distension and no mass. There is no tenderness. There is no rebound and no guarding.  Genitourinary:  Pelvic exam chaperoned by female ER tech, no right or left adnexal tenderness, no uterine tenderness, moderate vaginal bleeding, no hemorrhage, no vaginal discharge, no CMT or friability, no foreign body, no injury to the external genitalia, no other significant findings   Musculoskeletal: Normal range of motion. She exhibits no edema and no tenderness.  Neurological: She is alert and oriented to person, place, and time.  Skin: Skin is warm and dry.  Psychiatric: She has a normal mood and affect. Her behavior is normal. Judgment and thought content normal.    ED Course  Procedures (including critical care time) Results for orders placed during the hospital encounter of 05/22/14  WET PREP, GENITAL      Result Value Ref Range   Yeast Wet Prep HPF POC NONE SEEN  NONE SEEN   Trich, Wet Prep NONE SEEN  NONE SEEN   Clue Cells  Wet Prep HPF POC FEW (*) NONE SEEN   WBC, Wet Prep HPF POC NONE SEEN  NONE SEEN  CBC WITH DIFFERENTIAL      Result Value Ref Range   WBC 10.1  4.0 - 10.5 K/uL   RBC 4.32  3.87 - 5.11 MIL/uL   Hemoglobin 13.4  12.0 - 15.0 g/dL   HCT 16.1  09.6 - 04.5 %   MCV 91.0  78.0 - 100.0 fL   MCH 31.0  26.0 - 34.0 pg   MCHC 34.1  30.0 - 36.0 g/dL   RDW 40.9  81.1 - 91.4 %   Platelets 378  150 - 400 K/uL   Neutrophils Relative % 58  43 - 77 %   Neutro Abs 5.8  1.7 - 7.7 K/uL   Lymphocytes Relative 29  12 - 46 %   Lymphs Abs 2.9  0.7 - 4.0 K/uL   Monocytes Relative 8  3 - 12 %   Monocytes Absolute 0.8  0.1 - 1.0 K/uL   Eosinophils Relative 6 (*) 0 - 5 %   Eosinophils Absolute 0.6  0.0 - 0.7 K/uL   Basophils Relative 1  0 - 1 %   Basophils Absolute 0.1  0.0 - 0.1 K/uL  BASIC METABOLIC PANEL      Result Value Ref Range   Sodium 138  137 - 147 mEq/L   Potassium 4.6  3.7 - 5.3 mEq/L   Chloride 104  96 - 112 mEq/L   CO2 25  19 - 32 mEq/L   Glucose, Bld 97  70 - 99 mg/dL   BUN 16  6 - 23 mg/dL   Creatinine, Ser 7.82  0.50 - 1.10 mg/dL   Calcium 9.1  8.4 - 95.6 mg/dL   GFR calc non Af Amer >90  >90 mL/min   GFR calc Af Amer >90  >90 mL/min   Anion gap 9  5 - 15  URINALYSIS, ROUTINE W REFLEX MICROSCOPIC      Result Value Ref Range   Color, Urine YELLOW  YELLOW   APPearance CLEAR  CLEAR   Specific Gravity, Urine 1.019  1.005 - 1.030   pH 5.5  5.0 - 8.0   Glucose, UA NEGATIVE  NEGATIVE mg/dL   Hgb urine dipstick NEGATIVE  NEGATIVE   Bilirubin Urine NEGATIVE  NEGATIVE   Ketones, ur NEGATIVE  NEGATIVE mg/dL   Protein, ur NEGATIVE  NEGATIVE mg/dL   Urobilinogen, UA 0.2  0.0 - 1.0 mg/dL   Nitrite NEGATIVE  NEGATIVE   Leukocytes, UA NEGATIVE  NEGATIVE  POC URINE PREG, ED      Result Value Ref Range   Preg Test, Ur NEGATIVE  NEGATIVE   No results found.   Imaging Review No results found.   EKG Interpretation None      MDM   Final diagnoses:  Dysfunctional uterine bleeding     Patient with pelvic pain and vaginal  bleeding. States that she feels more tired than usual. Will check hemoglobin. Pelvic exam, and will reevaluate. Doubt torsion, as the pain is not unilateral.  Pelvic exam is unremarkable for adnexal or uterine tenderness. No imaging at this time. There is a moderate amount of blood in the vaginal vault, but no evidence of hemorrhage.  Patient seen by and discussed with Dr. Denton LankSteinl. Wet prep is unremarkable.  Recommend OBGYN follow-up for DUB.  Patient understands and agrees with the plan.  She is stable and ready for discharge.   Roxy Horsemanobert Cherly Erno, PA-C 05/22/14 2233

## 2014-05-22 NOTE — ED Notes (Signed)
Discharge instructions reviewed with pt. Pt verbalized understanding.   

## 2014-05-22 NOTE — ED Notes (Signed)
Pt states she has had bleeding for the past 2-3 days. Pt reports having Implanon, states she has periods but normally doesn't bleed heavy when she has a period. Pt reports soaking through 1 sanitary pad per hour. Pt rates abd pain 5/10 at present, states it's intermittent.

## 2014-05-22 NOTE — Discharge Instructions (Signed)
Abnormal Uterine Bleeding Abnormal uterine bleeding can affect women at various stages in life, including teenagers, women in their reproductive years, pregnant women, and women who have reached menopause. Several kinds of uterine bleeding are considered abnormal, including:  Bleeding or spotting between periods.   Bleeding after sexual intercourse.   Bleeding that is heavier or more than normal.   Periods that last longer than usual.  Bleeding after menopause.  Many cases of abnormal uterine bleeding are minor and simple to treat, while others are more serious. Any type of abnormal bleeding should be evaluated by your health care provider. Treatment will depend on the cause of the bleeding. HOME CARE INSTRUCTIONS Monitor your condition for any changes. The following actions may help to alleviate any discomfort you are experiencing:  Avoid the use of tampons and douches as directed by your health care provider.  Change your pads frequently. You should get regular pelvic exams and Pap tests. Keep all follow-up appointments for diagnostic tests as directed by your health care provider.  SEEK MEDICAL CARE IF:   Your bleeding lasts more than 1 week.   You feel dizzy at times.  SEEK IMMEDIATE MEDICAL CARE IF:   You pass out.   You are changing pads every 15 to 30 minutes.   You have abdominal pain.  You have a fever.   You become sweaty or weak.   You are passing large blood clots from the vagina.   You start to feel nauseous and vomit. MAKE SURE YOU:   Understand these instructions.  Will watch your condition.  Will get help right away if you are not doing well or get worse. Document Released: 10/16/2005 Document Revised: 10/21/2013 Document Reviewed: 05/15/2013 Exit[employer] Patient Information 2015 [employer]. This information is not intended to replace advice given to you by your health care provider. Make sure you discuss any questions you have with your  health care provider.  

## 2014-05-22 NOTE — ED Notes (Addendum)
Pt states that he has had irregular periods and intermittent vaginal bleeding and cramping that has increased. Pt states that she is on implanon birth control. Pt states that she also wants to be checked for STDs. Denies abnormal discharge between bleeding. Pt states that she feels tired as well

## 2014-05-22 NOTE — ED Provider Notes (Signed)
Medical screening examination/treatment/procedure(s) were conducted as a shared visit with non-physician practitioner(s) and myself.  I personally evaluated the patient during the encounter.  Pt c/o irregular vaginal bleeding. No fever or chills. No faintness or dizziness. abd soft nt. Labs.   Suzi RootsKevin E Diamon Reddinger, MD 05/22/14 [phone]

## 2014-05-23 LAB — GC/CHLAMYDIA PROBE AMP
CT PROBE, AMP APTIMA: NEGATIVE
GC PROBE AMP APTIMA: NEGATIVE

## 2014-08-31 ENCOUNTER — Encounter (HOSPITAL_COMMUNITY): Payer: Self-pay | Admitting: Emergency Medicine

## 2014-12-07 ENCOUNTER — Emergency Department (HOSPITAL_COMMUNITY): Payer: Medicaid Other

## 2014-12-07 ENCOUNTER — Encounter (HOSPITAL_COMMUNITY): Payer: Self-pay | Admitting: Emergency Medicine

## 2014-12-07 ENCOUNTER — Emergency Department (HOSPITAL_COMMUNITY)
Admission: EM | Admit: 2014-12-07 | Discharge: 2014-12-07 | Disposition: A | Discharge status: Home / Self Care | Payer: Medicaid Other | Attending: Emergency Medicine | Admitting: Emergency Medicine

## 2014-12-07 DIAGNOSIS — Z720 Tobacco use: Secondary | ICD-10-CM | POA: Insufficient documentation

## 2014-12-07 DIAGNOSIS — Z3202 Encounter for pregnancy test, result negative: Secondary | ICD-10-CM | POA: Diagnosis not present

## 2014-12-07 DIAGNOSIS — K5900 Constipation, unspecified: Secondary | ICD-10-CM | POA: Insufficient documentation

## 2014-12-07 DIAGNOSIS — Z8742 Personal history of other diseases of the female genital tract: Secondary | ICD-10-CM | POA: Diagnosis not present

## 2014-12-07 DIAGNOSIS — Z79899 Other long term (current) drug therapy: Secondary | ICD-10-CM | POA: Insufficient documentation

## 2014-12-07 DIAGNOSIS — Z87442 Personal history of urinary calculi: Secondary | ICD-10-CM | POA: Diagnosis not present

## 2014-12-07 DIAGNOSIS — R109 Unspecified abdominal pain: Secondary | ICD-10-CM

## 2014-12-07 LAB — COMPREHENSIVE METABOLIC PANEL
ALBUMIN: 4.1 g/dL (ref 3.5–5.2)
ALT: 35 U/L (ref 0–35)
AST: 23 U/L (ref 0–37)
Alkaline Phosphatase: 82 U/L (ref 39–117)
Anion gap: 9 (ref 5–15)
BUN: 14 mg/dL (ref 6–23)
CO2: 25 mmol/L (ref 19–32)
Calcium: 9.4 mg/dL (ref 8.4–10.5)
Chloride: 103 mmol/L (ref 96–112)
Creatinine, Ser: 0.69 mg/dL (ref 0.50–1.10)
GFR calc Af Amer: >90 mL/min (ref >90)
GFR calc non Af Amer: >90 mL/min (ref >90)
Glucose, Bld: 101 mg/dL — ABNORMAL HIGH (ref 70–99)
Potassium: 4.2 mmol/L (ref 3.5–5.1)
Sodium: 137 mmol/L (ref 135–145)
TOTAL PROTEIN: 7.5 g/dL (ref 6.0–8.3)
Total Bilirubin: 0.5 mg/dL (ref 0.3–1.2)

## 2014-12-07 LAB — CBC WITH DIFFERENTIAL/PLATELET
Basophils Absolute: 0.1 10*3/uL (ref 0.0–0.1)
Basophils Relative: 1 % (ref 0–1)
EOS ABS: 0.3 10*3/uL (ref 0.0–0.7)
Eosinophils Relative: 3 % (ref 0–5)
HEMATOCRIT: 42.2 % (ref 36.0–46.0)
HEMOGLOBIN: 14.6 g/dL (ref 12.0–15.0)
Lymphocytes Relative: 28 % (ref 12–46)
Lymphs Abs: 2.9 10*3/uL (ref 0.7–4.0)
MCH: 30.7 pg (ref 26.0–34.0)
MCHC: 34.6 g/dL (ref 30.0–36.0)
MCV: 88.7 fL (ref 78.0–100.0)
Monocytes Absolute: 0.7 10*3/uL (ref 0.1–1.0)
Monocytes Relative: 7 % (ref 3–12)
NEUTROS ABS: 6.6 10*3/uL (ref 1.7–7.7)
Neutrophils Relative %: 61 % (ref 43–77)
Platelets: 471 10*3/uL — ABNORMAL HIGH (ref 150–400)
RBC: 4.76 MIL/uL (ref 3.87–5.11)
RDW: 11.9 % (ref 11.5–15.5)
WBC: 10.6 10*3/uL — ABNORMAL HIGH (ref 4.0–10.5)

## 2014-12-07 LAB — URINALYSIS, ROUTINE W REFLEX MICROSCOPIC
Bilirubin Urine: NEGATIVE
GLUCOSE, UA: NEGATIVE mg/dL
Ketones, ur: NEGATIVE mg/dL
Leukocytes, UA: NEGATIVE
Nitrite: NEGATIVE
PH: 6 (ref 5.0–8.0)
Protein, ur: NEGATIVE mg/dL
Specific Gravity, Urine: 1.023 (ref 1.005–1.030)
UROBILINOGEN UA: 0.2 mg/dL (ref 0.0–1.0)

## 2014-12-07 LAB — LIPASE, BLOOD: LIPASE: 30 U/L (ref 11–59)

## 2014-12-07 LAB — URINE MICROSCOPIC-ADD ON

## 2014-12-07 LAB — POC URINE PREG, ED: Preg Test, Ur: NEGATIVE

## 2014-12-07 MED ORDER — DOCUSATE SODIUM 100 MG PO CAPS: 100.0000 mg | ORAL_CAPSULE | Freq: Two times a day (BID) | ORAL | Status: DC | PRN

## 2014-12-07 MED ORDER — SODIUM CHLORIDE 0.9 % IV BOLUS (SEPSIS)
1000.0000 mL | Freq: Once | INTRAVENOUS | Status: AC
  Administered 2014-12-07: 1000 mL via INTRAVENOUS

## 2014-12-07 MED ORDER — MORPHINE SULFATE 4 MG/ML IJ SOLN
4.0000 mg | Freq: Once | INTRAMUSCULAR | Status: AC
  Administered 2014-12-07: 4 mg via INTRAVENOUS
  Filled 2014-12-07: qty 1

## 2014-12-07 MED ORDER — PANTOPRAZOLE SODIUM 40 MG IV SOLR
40.0000 mg | Freq: Once | INTRAVENOUS | Status: AC
  Administered 2014-12-07: 40 mg via INTRAVENOUS
  Filled 2014-12-07: qty 40

## 2014-12-07 NOTE — ED Notes (Signed)
Pt sts LLQ pain x 1 week with some bloating

## 2014-12-07 NOTE — Discharge Instructions (Signed)
Read the information below.  Use the prescribed medication as directed.  Please discuss all new medications with your pharmacist.  You may return to the Emergency Department at any time for worsening condition or any new symptoms that concern you.  If you develop high fevers, worsening abdominal pain, uncontrolled vomiting, or are unable to tolerate fluids by mouth, return to the ER for a recheck.  ° ° °Abdominal Pain °Many things can cause abdominal pain. Usually, abdominal pain is not caused by a disease and will improve without treatment. It can often be observed and treated at home. Your health care provider will do a physical exam and possibly order blood tests and X-rays to help determine the seriousness of your pain. However, in many cases, more time must pass before a clear cause of the pain can be found. Before that point, your health care provider may not know if you need more testing or further treatment. °HOME CARE INSTRUCTIONS  °Monitor your abdominal pain for any changes. The following actions may help to alleviate any discomfort you are experiencing: °· Only take over-the-counter or prescription medicines as directed by your health care provider. °· Do not take laxatives unless directed to do so by your health care provider. °· Try a clear liquid diet (broth, tea, or water) as directed by your health care provider. Slowly move to a bland diet as tolerated. °SEEK MEDICAL CARE IF: °· You have unexplained abdominal pain. °· You have abdominal pain associated with nausea or diarrhea. °· You have pain when you urinate or have a bowel movement. °· You experience abdominal pain that wakes you in the night. °· You have abdominal pain that is worsened or improved by eating food. °· You have abdominal pain that is worsened with eating fatty foods. °· You have a fever. °SEEK IMMEDIATE MEDICAL CARE IF:  °· Your pain does not go away within 2 hours. °· You keep throwing up (vomiting). °· Your pain is felt only in  portions of the abdomen, such as the right side or the left lower portion of the abdomen. °· You pass bloody or black tarry stools. °MAKE SURE YOU: °· Understand these instructions.   °· Will watch your condition.   °· Will get help right away if you are not doing well or get worse.   °Document Released: 07/26/2005 Document Revised: 10/21/2013 Document Reviewed: 06/25/2013 °ExitCare® Patient Information ©2015 [employer]. This information is not intended to replace advice given to you by your health care provider. Make sure you discuss any questions you have with your health care provider. ° °Constipation °Constipation is when a person has fewer than three bowel movements a week, has difficulty having a bowel movement, or has stools that are dry, hard, or larger than normal. As people grow older, constipation is more common. If you try to fix constipation with medicines that make you have a bowel movement (laxatives), the problem may get worse. Long-term laxative use may cause the muscles of the colon to become weak. A low-fiber diet, not taking in enough fluids, and taking certain medicines may make constipation worse.  °CAUSES  °· Certain medicines, such as antidepressants, pain medicine, iron supplements, antacids, and water pills.   °· Certain diseases, such as diabetes, irritable bowel syndrome (IBS), thyroid disease, or depression.   °· Not drinking enough water.   °· Not eating enough fiber-rich foods.   °· Stress or travel.   °· Lack of physical activity or exercise.   °· Ignoring the urge to have a bowel movement.   °·   Using laxatives too much.   °SIGNS AND SYMPTOMS  °· Having fewer than three bowel movements a week.   °· Straining to have a bowel movement.   °· Having stools that are hard, dry, or larger than normal.   °· Feeling full or bloated.   °· Pain in the lower abdomen.   °· Not feeling relief after having a bowel movement.   °DIAGNOSIS  °Your health care provider will take a medical history and  perform a physical exam. Further testing may be done for severe constipation. Some tests may include: °· A barium enema X-ray to examine your rectum, colon, and, sometimes, your small intestine.   °· A sigmoidoscopy to examine your lower colon.   °· A colonoscopy to examine your entire colon. °TREATMENT  °Treatment will depend on the severity of your constipation and what is causing it. Some dietary treatments include drinking more fluids and eating more fiber-rich foods. Lifestyle treatments may include regular exercise. If these diet and lifestyle recommendations do not help, your health care provider may recommend taking over-the-counter laxative medicines to help you have bowel movements. Prescription medicines may be prescribed if over-the-counter medicines do not work.  °HOME CARE INSTRUCTIONS  °· Eat foods that have a lot of fiber, such as fruits, vegetables, whole grains, and beans. °· Limit foods high in fat and processed sugars, such as french fries, hamburgers, cookies, candies, and soda.   °· A fiber supplement may be added to your diet if you cannot get enough fiber from foods.   °· Drink enough fluids to keep your urine clear or pale yellow.   °· Exercise regularly or as directed by your health care provider.   °· Go to the restroom when you have the urge to go. Do not hold it.   °· Only take over-the-counter or prescription medicines as directed by your health care provider. Do not take other medicines for constipation without talking to your health care provider first.   °SEEK IMMEDIATE MEDICAL CARE IF:  °· You have bright red blood in your stool.   °· Your constipation lasts for more than 4 days or gets worse.   °· You have abdominal or rectal pain.   °· You have thin, pencil-like stools.   °· You have unexplained weight loss. °MAKE SURE YOU:  °· Understand these instructions. °· Will watch your condition. °· Will get help right away if you are not doing well or get worse. °Document Released:  07/14/2004 Document Revised: 10/21/2013 Document Reviewed: 07/28/2013 °ExitCare® Patient Information ©2015 [employer]. This information is not intended to replace advice given to you by your health care provider. Make sure you discuss any questions you have with your health care provider. ° °

## 2014-12-07 NOTE — ED Provider Notes (Signed)
CSN: [id number]     Arrival date & time 12/07/14  1623 History   First MD Initiated Contact with Patient 12/07/14 1938     Chief Complaint  Patient presents with  . Abdominal Pain     (Consider location/radiation/quality/duration/timing/severity/associated sxs/prior Treatment) HPI   Patient presents with abdominal pain and bloating x 1 week, notes she "feels something moving around" in her abdomen.  Pain is aching and constant with occasional sharp pains.  Has been very thristy with increased urination and has had episodes of diarrhea.  When she has diarrhea she has it approximately 5 times daily.  She has not had diarrhea in 3 days, and has had normal BMs since.  Also has had occasional nausea.  Has had some vaginal spotting but states this is normal for her with her nexplanon.  [facility] and pelvic exam was performed.   Denies abnormal vaginal discharge, dysuria.  Denies fevers, appetite changes.  Past Medical History  Diagnosis Date  . Kidney stone   . Gallstones   . Ovarian cyst    Past Surgical History  Procedure Laterality Date  . Lithotripsy     History reviewed. No pertinent family history. History  Substance Use Topics  . Smoking status: Current Every Day Smoker -- 0.50 packs/day    Types: Cigarettes  . Smokeless tobacco: Not on file  . Alcohol Use: Yes     Comment: socially   OB History    Gravida Para Term Preterm AB TAB SAB Ectopic Multiple Living   1              Review of Systems  All other systems reviewed and are negative.     Allergies  Stadol  Home Medications   Prior to Admission medications   Medication Sig Start Date End Date Taking? Authorizing Provider  amphetamine-dextroamphetamine (ADDERALL) 20 MG tablet Take 20 mg by mouth 2 (two) times daily.    Historical Provider, MD  HYDROcodone-acetaminophen (NORCO/VICODIN) 5-325 MG per tablet Take 1 tablet by mouth every 6 (six) hours as needed for moderate pain  or severe pain. 05/22/14   Roxy Horsemanobert Browning, PA-C  ibuprofen (ADVIL,MOTRIN) 600 MG tablet Take 1 tablet (600 mg total) by mouth every 6 (six) hours as needed. 02/15/14   Antony MaduraKelly Humes, PA-C  Multiple Vitamin (MULTIVITAMIN WITH MINERALS) TABS tablet Take 1 tablet by mouth daily.    Historical Provider, MD  venlafaxine XR (EFFEXOR-XR) 75 MG 24 hr capsule Take 75 mg by mouth every evening.    Historical Provider, MD   BP 107/73 mmHg  Pulse 88  Temp(Src) 98.5 F (36.9 C) (Oral)  Resp 20  Ht 5\' 7"  (1.702 m)  Wt 141 lb (63.957 kg)  BMI 22.08 kg/m2  SpO2 100% Physical Exam  Constitutional: She appears well-developed and well-nourished. No distress.  HENT:  Head: Normocephalic and atraumatic.  Neck: Neck supple.  Cardiovascular: Normal rate and regular rhythm.   Pulmonary/Chest: Effort normal and breath sounds normal. No respiratory distress. She has no wheezes. She has no rales.  Abdominal: Soft. She exhibits no distension. There is tenderness in the epigastric area and left lower quadrant. There is no rebound and no guarding.  Epigastric tenderness.  Mild tenderness in left lower quadrant.    Genitourinary: Rectum normal. Rectal exam shows no external hemorrhoid, no internal hemorrhoid, no mass, no tenderness and anal tone normal.  Rectal exam performed with nurse chaperone present.  No stool or blood on glove.  No stool impaction.    Neurological: She is alert.  Skin: She is not diaphoretic.  Nursing note and vitals reviewed.   ED Course  Procedures (including critical care time) Labs Review Labs Reviewed  CBC WITH DIFFERENTIAL/PLATELET - Abnormal; Notable for the following:    WBC 10.6 (*)    Platelets 471 (*)    All other components within normal limits  COMPREHENSIVE METABOLIC PANEL - Abnormal; Notable for the following:    Glucose, Bld 101 (*)    All other components within normal limits  URINALYSIS, ROUTINE W REFLEX MICROSCOPIC - Abnormal; Notable for the following:    APPearance  HAZY (*)    Hgb urine dipstick LARGE (*)    All other components within normal limits  URINE MICROSCOPIC-ADD ON - Abnormal; Notable for the following:    Squamous Epithelial / LPF MANY (*)    Bacteria, UA FEW (*)    All other components within normal limits  LIPASE, BLOOD  POC URINE PREG, ED    Imaging Review Dg Abd Acute W/chest  12/07/2014   CLINICAL DATA:  Left lower quadrant abdominal pain and difficulty breathing  EXAM: ACUTE ABDOMEN SERIES (ABDOMEN 2 VIEW & CHEST 1 VIEW)  COMPARISON:  Chest radiograph August 18, 2014; CT abdomen and pelvis February 15, 2014  FINDINGS: PA chest: There is no appreciable edema or consolidation. Heart size and pulmonary vascularity are within normal limits. No adenopathy.  Supine and upright abdomen: There is fairly diffuse stool throughout the colon. There is no bowel dilatation or air-fluid level suggesting obstruction. No free air.  IMPRESSION: Diffuse stool throughout colon. Overall bowel gas pattern unremarkable. No lung edema or consolidation.   Electronically Signed   By: Bretta Bang M.D.   On: 12/07/2014 21:11     EKG Interpretation None       10:16 PM Repeat abdominal exam benign.  Soft, nondistended, nontender, no guarding, no rebound.    MDM   Final diagnoses:  Abdominal pain, unspecified abdominal location  Constipation, unspecified constipation type    Afebrile, nontoxic patient with abdominal pain, subjective bloating.  Abdominal exam unremarkable.  Xray shows large amount of stool.  Labs reflect very mild leukocytosis, urine shows blood likely from what patient describes as normal vaginal spotting.  D/C home with colace, PCP follow up.  Discussed result, findings, treatment, and follow up  with patient.  Pt given return precautions.  Pt verbalizes understanding and agrees with plan.        Trixie Dredge, PA-C 12/07/14 2218  Tilden Fossa, MD 12/08/14 [phone]

## 2014-12-07 NOTE — ED Notes (Signed)
NOTIFIED TRIAGE BY PHONE OF PATIENTS LAB RESULTS FOR URINE PREGNANCY ,@17 :10 PM ,12/07/2014.

## 2014-12-07 NOTE — ED Notes (Signed)
Attempted IV unsuccessful 

## 2016-02-07 IMAGING — DX DG ABDOMEN ACUTE W/ 1V CHEST
3 series · 3 of 3 positions shown · non-contrast
Comparison: Chest radiograph August 18, 2014; CT abdomen and
pelvis February 15, 2014

CLINICAL DATA: Left lower quadrant abdominal pain and difficulty
breathing

EXAM:
ACUTE ABDOMEN SERIES (ABDOMEN 2 VIEW & CHEST 1 VIEW)

[chest pa]
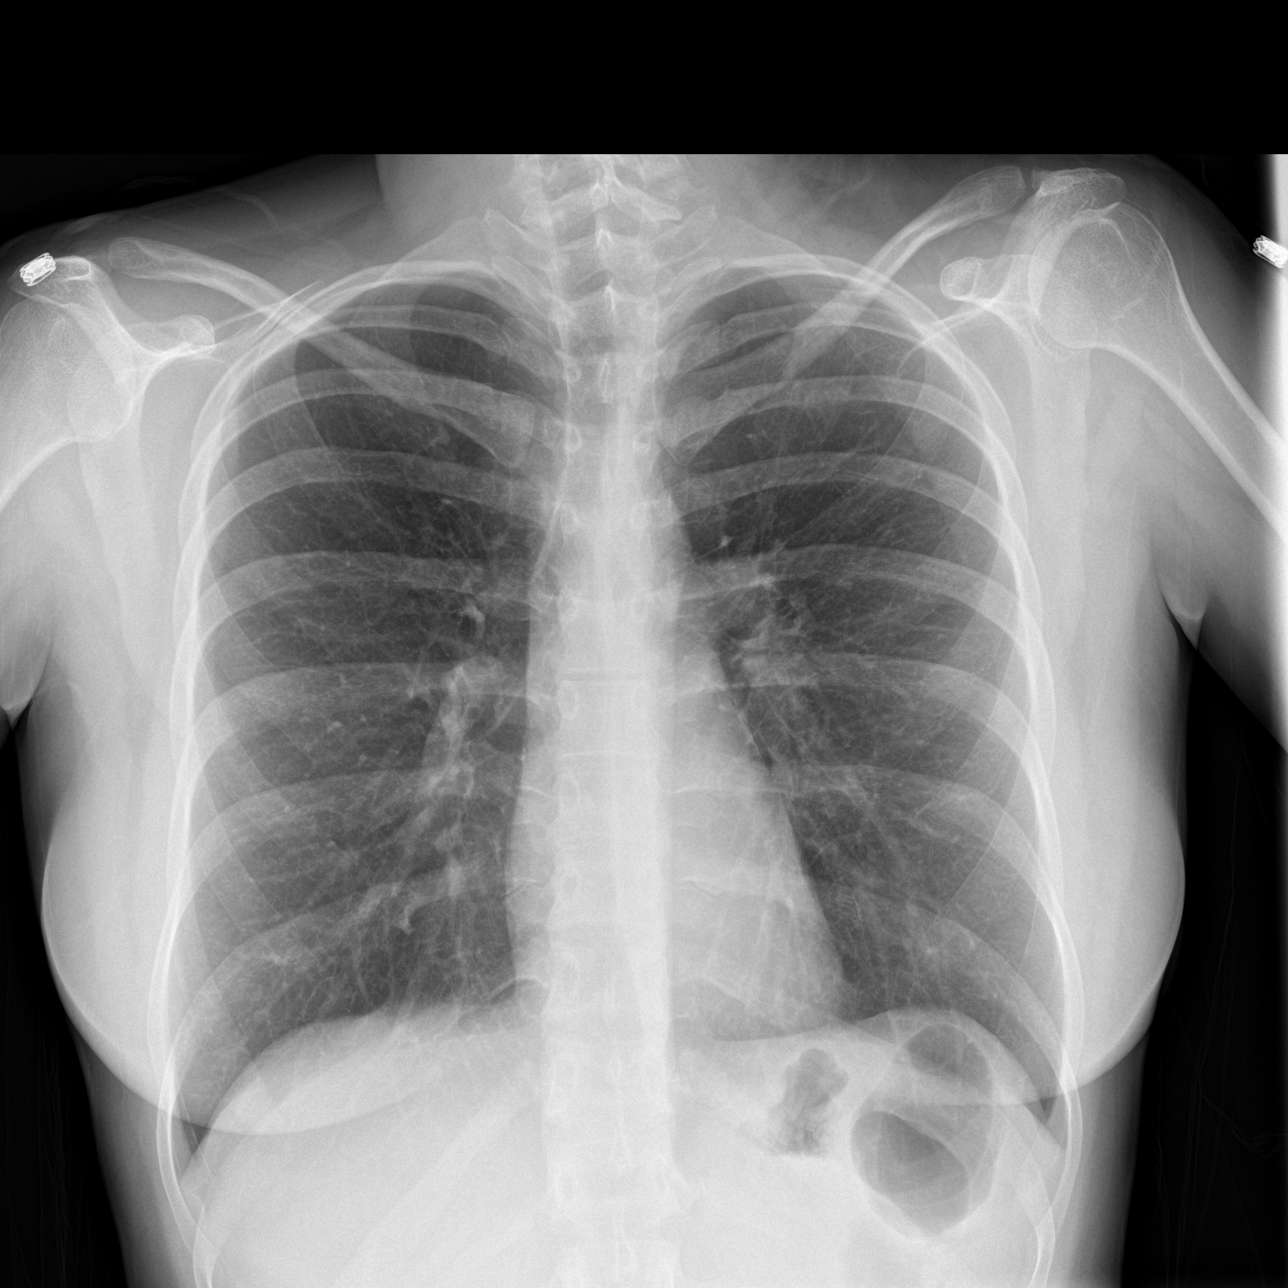

[abdomen erect]
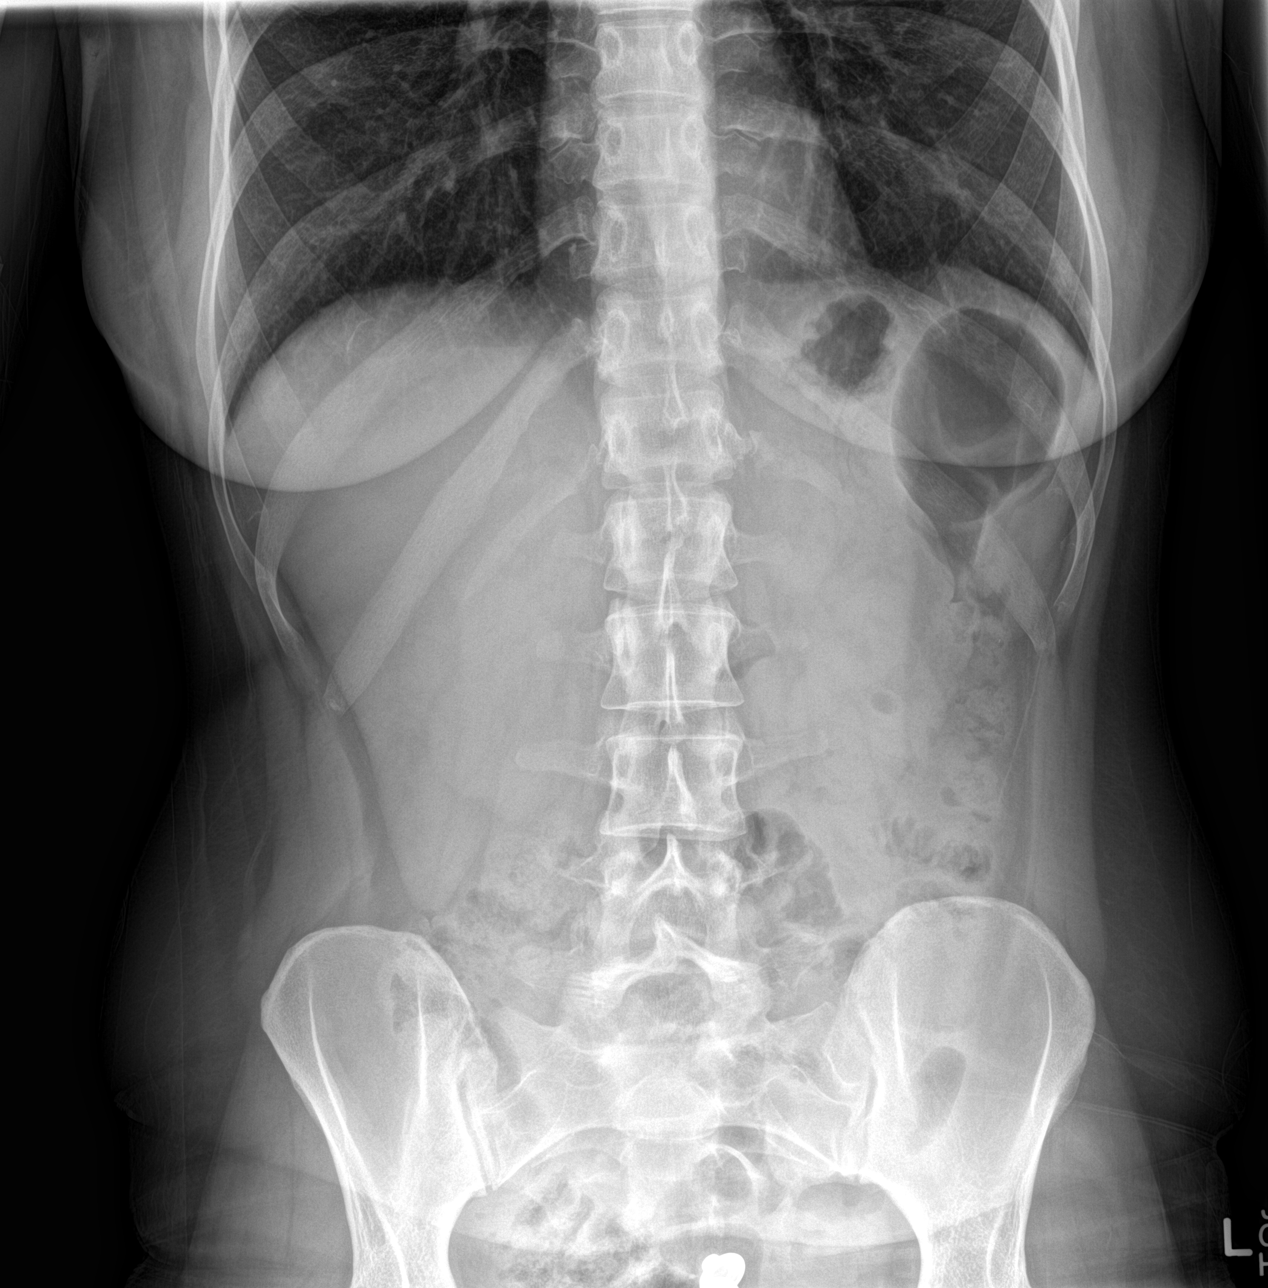

[abdomen supine]
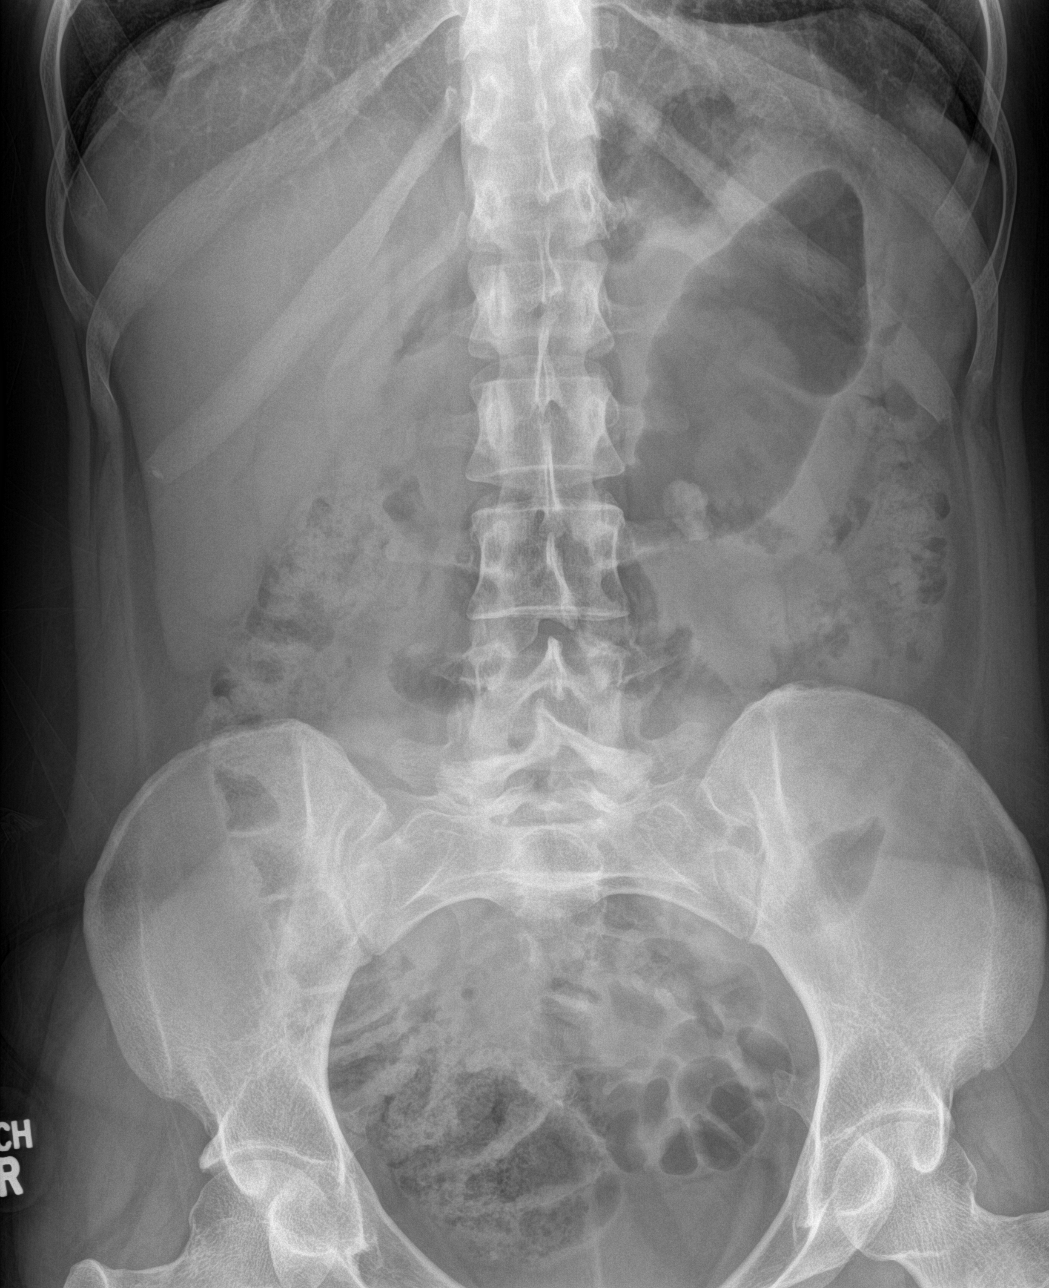

[3 of 3 positions shown; findings below may reference images not displayed]

FINDINGS: PA chest: There is no appreciable edema or consolidation. Heart size
and pulmonary vascularity are within normal limits. No adenopathy.

Supine and upright abdomen: There is fairly diffuse stool throughout
the colon. There is no bowel dilatation or air-fluid level
suggesting obstruction. No free air.
IMPRESSION: Diffuse stool throughout colon. Overall bowel gas pattern
unremarkable. No lung edema or consolidation.

## 2016-03-22 ENCOUNTER — Encounter (HOSPITAL_COMMUNITY): Payer: Self-pay | Admitting: Emergency Medicine

## 2016-03-22 ENCOUNTER — Emergency Department (HOSPITAL_COMMUNITY)
Admission: EM | Admit: 2016-03-22 | Discharge: 2016-03-22 | Disposition: A | Discharge status: Home / Self Care | Payer: Medicaid Other | Attending: Emergency Medicine | Admitting: Emergency Medicine

## 2016-03-22 ENCOUNTER — Encounter (HOSPITAL_COMMUNITY): Payer: Self-pay | Admitting: *Deleted

## 2016-03-22 ENCOUNTER — Inpatient Hospital Stay (HOSPITAL_COMMUNITY)
Admission: AD | Admit: 2016-03-22 | Discharge: 2016-03-28 | DRG: 885 | Disposition: A | Discharge status: Home / Self Care | Payer: Medicaid Other | Source: Intra-hospital | Attending: Psychiatry | Admitting: Psychiatry

## 2016-03-22 DIAGNOSIS — Y939 Activity, unspecified: Secondary | ICD-10-CM | POA: Insufficient documentation

## 2016-03-22 DIAGNOSIS — F1020 Alcohol dependence, uncomplicated: Secondary | ICD-10-CM | POA: Diagnosis present

## 2016-03-22 DIAGNOSIS — R45851 Suicidal ideations: Secondary | ICD-10-CM | POA: Diagnosis present

## 2016-03-22 DIAGNOSIS — F17210 Nicotine dependence, cigarettes, uncomplicated: Secondary | ICD-10-CM | POA: Insufficient documentation

## 2016-03-22 DIAGNOSIS — F329 Major depressive disorder, single episode, unspecified: Secondary | ICD-10-CM | POA: Diagnosis present

## 2016-03-22 DIAGNOSIS — Y929 Unspecified place or not applicable: Secondary | ICD-10-CM | POA: Insufficient documentation

## 2016-03-22 DIAGNOSIS — Y908 Blood alcohol level of 240 mg/100 ml or more: Secondary | ICD-10-CM | POA: Diagnosis present

## 2016-03-22 DIAGNOSIS — Y999 Unspecified external cause status: Secondary | ICD-10-CM | POA: Insufficient documentation

## 2016-03-22 DIAGNOSIS — S61512A Laceration without foreign body of left wrist, initial encounter: Secondary | ICD-10-CM | POA: Insufficient documentation

## 2016-03-22 DIAGNOSIS — F332 Major depressive disorder, recurrent severe without psychotic features: Principal | ICD-10-CM | POA: Diagnosis present

## 2016-03-22 DIAGNOSIS — T1491 Suicide attempt: Secondary | ICD-10-CM | POA: Insufficient documentation

## 2016-03-22 DIAGNOSIS — R4589 Other symptoms and signs involving emotional state: Secondary | ICD-10-CM

## 2016-03-22 DIAGNOSIS — Z79899 Other long term (current) drug therapy: Secondary | ICD-10-CM | POA: Insufficient documentation

## 2016-03-22 DIAGNOSIS — R4689 Other symptoms and signs involving appearance and behavior: Secondary | ICD-10-CM

## 2016-03-22 DIAGNOSIS — X781XXA Intentional self-harm by knife, initial encounter: Secondary | ICD-10-CM | POA: Insufficient documentation

## 2016-03-22 LAB — CBC
HCT: 40.2 % (ref 36.0–46.0)
Hemoglobin: 14.4 g/dL (ref 12.0–15.0)
MCH: 30.6 pg (ref 26.0–34.0)
MCHC: 35.8 g/dL (ref 30.0–36.0)
MCV: 85.5 fL (ref 78.0–100.0)
Platelets: 435 10*3/uL — ABNORMAL HIGH (ref 150–400)
RBC: 4.7 MIL/uL (ref 3.87–5.11)
RDW: 12.5 % (ref 11.5–15.5)
WBC: 17.2 10*3/uL — ABNORMAL HIGH (ref 4.0–10.5)

## 2016-03-22 LAB — COMPREHENSIVE METABOLIC PANEL
ALBUMIN: 4.4 g/dL (ref 3.5–5.0)
ALK PHOS: 73 U/L (ref 38–126)
ALT: 65 U/L — AB (ref 14–54)
AST: 47 U/L — AB (ref 15–41)
Anion gap: 10 (ref 5–15)
BUN: 8 mg/dL (ref 6–20)
CALCIUM: 8.6 mg/dL — AB (ref 8.9–10.3)
CO2: 21 mmol/L — AB (ref 22–32)
Chloride: 106 mmol/L (ref 101–111)
Creatinine, Ser: 0.72 mg/dL (ref 0.44–1.00)
GFR calc non Af Amer: >60 mL/min (ref >60)
Glucose, Bld: 124 mg/dL — ABNORMAL HIGH (ref 65–99)
POTASSIUM: 3.5 mmol/L (ref 3.5–5.1)
SODIUM: 137 mmol/L (ref 135–145)
Total Bilirubin: <0.1 mg/dL — ABNORMAL LOW (ref 0.3–1.2)
Total Protein: 7.7 g/dL (ref 6.5–8.1)

## 2016-03-22 LAB — URINALYSIS, ROUTINE W REFLEX MICROSCOPIC
BILIRUBIN URINE: NEGATIVE
Glucose, UA: NEGATIVE mg/dL
HGB URINE DIPSTICK: NEGATIVE
KETONES UR: NEGATIVE mg/dL
Leukocytes, UA: NEGATIVE
NITRITE: NEGATIVE
PROTEIN: NEGATIVE mg/dL
SPECIFIC GRAVITY, URINE: 1.009 (ref 1.005–1.030)
pH: 5 (ref 5.0–8.0)

## 2016-03-22 LAB — RAPID URINE DRUG SCREEN, HOSP PERFORMED
Amphetamines: NOT DETECTED
Barbiturates: NOT DETECTED
Benzodiazepines: NOT DETECTED
COCAINE: NOT DETECTED
OPIATES: NOT DETECTED
Tetrahydrocannabinol: NOT DETECTED

## 2016-03-22 LAB — ACETAMINOPHEN LEVEL: Acetaminophen (Tylenol), Serum: <10 ug/mL — ABNORMAL LOW (ref 10–30)

## 2016-03-22 LAB — ETHANOL: Alcohol, Ethyl (B): 228 mg/dL — ABNORMAL HIGH (ref <5)

## 2016-03-22 LAB — POC URINE PREG, ED: Preg Test, Ur: NEGATIVE

## 2016-03-22 LAB — SALICYLATE LEVEL

## 2016-03-22 MED ORDER — VENLAFAXINE HCL ER 75 MG PO CP24
75.0000 mg | ORAL_CAPSULE | Freq: Every day | ORAL | Status: DC
  Administered 2016-03-23: 75 mg via ORAL
  Filled 2016-03-22 (×2): qty 1

## 2016-03-22 MED ORDER — VENLAFAXINE HCL ER 75 MG PO CP24
75.0000 mg | ORAL_CAPSULE | Freq: Every day | ORAL | Status: DC
  Administered 2016-03-22: 75 mg via ORAL
  Filled 2016-03-22 (×3): qty 1

## 2016-03-22 MED ORDER — LORAZEPAM 1 MG PO TABS: 1.0000 mg | ORAL_TABLET | Freq: Four times a day (QID) | ORAL | Status: DC | PRN

## 2016-03-22 MED ORDER — ONDANSETRON 4 MG PO TBDP: 4.0000 mg | ORAL_TABLET | Freq: Four times a day (QID) | ORAL | Status: AC | PRN

## 2016-03-22 MED ORDER — HYDROXYZINE HCL 25 MG PO TABS: 25.0000 mg | ORAL_TABLET | Freq: Four times a day (QID) | ORAL | Status: DC | PRN

## 2016-03-22 MED ORDER — NICOTINE POLACRILEX 2 MG MT GUM: 2.0000 mg | CHEWING_GUM | OROMUCOSAL | Status: DC | PRN

## 2016-03-22 MED ORDER — NICOTINE 7 MG/24HR TD PT24
7.0000 mg | MEDICATED_PATCH | Freq: Every day | TRANSDERMAL | Status: DC
  Administered 2016-03-23 – 2016-03-28 (×6): 7 mg via TRANSDERMAL
  Filled 2016-03-22 (×10): qty 1

## 2016-03-22 MED ORDER — LORAZEPAM 1 MG PO TABS
1.0000 mg | ORAL_TABLET | Freq: Four times a day (QID) | ORAL | Status: AC | PRN
  Administered 2016-03-24: 1 mg via ORAL
  Filled 2016-03-22: qty 1

## 2016-03-22 MED ORDER — ACETAMINOPHEN 325 MG PO TABS
650.0000 mg | ORAL_TABLET | Freq: Four times a day (QID) | ORAL | Status: DC | PRN
  Administered 2016-03-22 – 2016-03-23 (×2): 650 mg via ORAL
  Filled 2016-03-22 (×2): qty 2

## 2016-03-22 MED ORDER — ACETAMINOPHEN 325 MG PO TABS
ORAL_TABLET | ORAL | Status: AC
  Filled 2016-03-22: qty 2

## 2016-03-22 MED ORDER — LIDOCAINE-EPINEPHRINE (PF) 2 %-1:200000 IJ SOLN
20.0000 mL | Freq: Once | INTRAMUSCULAR | Status: AC
  Administered 2016-03-22: 20 mL
  Filled 2016-03-22: qty 20

## 2016-03-22 MED ORDER — ONDANSETRON 4 MG PO TBDP
4.0000 mg | ORAL_TABLET | Freq: Four times a day (QID) | ORAL | Status: DC | PRN
  Administered 2016-03-22: 4 mg via ORAL
  Filled 2016-03-22: qty 1

## 2016-03-22 MED ORDER — LOPERAMIDE HCL 2 MG PO CAPS: 2.0000 mg | ORAL_CAPSULE | ORAL | Status: AC | PRN

## 2016-03-22 MED ORDER — VITAMIN B-1 100 MG PO TABS: 100.0000 mg | ORAL_TABLET | Freq: Every day | ORAL | Status: DC

## 2016-03-22 MED ORDER — ADULT MULTIVITAMIN W/MINERALS CH
1.0000 | ORAL_TABLET | Freq: Every day | ORAL | Status: DC
  Administered 2016-03-23 – 2016-03-28 (×6): 1 via ORAL
  Filled 2016-03-22 (×8): qty 1

## 2016-03-22 MED ORDER — ADULT MULTIVITAMIN W/MINERALS CH
1.0000 | ORAL_TABLET | Freq: Every day | ORAL | Status: DC
  Administered 2016-03-22: 1 via ORAL
  Filled 2016-03-22: qty 1

## 2016-03-22 MED ORDER — THIAMINE HCL 100 MG/ML IJ SOLN
100.0000 mg | Freq: Once | INTRAMUSCULAR | Status: AC
Start: 2016-03-22 — End: 2016-03-22
  Administered 2016-03-22: 100 mg via INTRAMUSCULAR
  Filled 2016-03-22: qty 2

## 2016-03-22 MED ORDER — TRAZODONE HCL 50 MG PO TABS
50.0000 mg | ORAL_TABLET | Freq: Every evening | ORAL | Status: DC | PRN
  Administered 2016-03-22: 50 mg via ORAL
  Filled 2016-03-22 (×5): qty 1

## 2016-03-22 MED ORDER — VITAMIN B-1 100 MG PO TABS
100.0000 mg | ORAL_TABLET | Freq: Every day | ORAL | Status: DC
  Administered 2016-03-23 – 2016-03-28 (×6): 100 mg via ORAL
  Filled 2016-03-22 (×8): qty 1

## 2016-03-22 MED ORDER — LOPERAMIDE HCL 2 MG PO CAPS: 2.0000 mg | ORAL_CAPSULE | ORAL | Status: DC | PRN

## 2016-03-22 MED ORDER — HYDROXYZINE HCL 25 MG PO TABS
25.0000 mg | ORAL_TABLET | Freq: Four times a day (QID) | ORAL | Status: AC | PRN
  Administered 2016-03-22 – 2016-03-25 (×2): 25 mg via ORAL
  Filled 2016-03-22 (×3): qty 1

## 2016-03-22 NOTE — ED Notes (Signed)
Pt behavior cooperative. Pleasant on approach. Pt reports increase in depression since her mother died a couple of years ago. Pt denies s/s withdrawal. Pt denies SI/HI. Denies AVH. Dressing at left wrist dry and intact. Special checks q 15 mins in place for safety. Video monitoring in place.

## 2016-03-22 NOTE — Progress Notes (Signed)
Kathryn Holland is very tearful upon my entrance into her room. Her lights are out and she is lying in the bed crying. She rates Depression and Anxiety 7/10. Denies SI/HI/AVH. Her fiance and her nephew came to visit her this evening. She endorses them as her main support system.  Encouragement and support given. Medications administered as prescribed. Continue Q 15 minute checks for patient safety and medication effectiveness.

## 2016-03-22 NOTE — BH Assessment (Signed)
Counselor in room with pt., working on cart issue. Tried call at 07:48 a.m., no answer at time. Waiting on resolve of cart issue. Rydge Texidor K. Sherlon HandingHarris, LCAS-A, LPC-A, [facility]  Counselor 03/22/2016 7:48 AM

## 2016-03-22 NOTE — BH Assessment (Signed)
Tele-psych cart placed in pt. Room began at 0730 03-22-16, cart stopped working during assessment. Contacted charge nurse, will look at issue, contact this counselor when looked at.   Gaige Sebo K. Sherlon HandingHarris, LCAS-A, LPC-A, [facility]  Counselor 03/22/2016 7:39 AM

## 2016-03-22 NOTE — Progress Notes (Signed)
Self inflicted cut to left wrist of 10cm, with four sutures. Wound assessed, cleaned, and redressed. Tylenol order received for pain in wrist of 4/10. Tylenol given at 6:50pm for pain.

## 2016-03-22 NOTE — ED Notes (Signed)
Pt being sutured

## 2016-03-22 NOTE — ED Notes (Signed)
Patient to SI with a plan to cut wrist. Patient denies HI and AVH. Plan of care discussed with patient. Patient voices no complaints tor concerns at this time. Encouragement and support provided and safety maintain Q 15 min safety checks remain in place.
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
## 2016-03-22 NOTE — ED Notes (Addendum)
Pt from home with complaints of SI and etoh intoxication. Pt states she has had 2 drinks tonight. Pt has a 3 inch laceration on her left wrist with adipose tissue visible. EMS wrapped the wound and bleeding is controlled at this time. Pt states she has not been taking any of her medications since except effexor and that has exacerbated her depression. Per EMS, pt is very labile. Pt states she has not been going to her alcoholism class and has been drinking at night. Pt is tearful during assessment . Pt still endorses SI, but denies HI and AVH.

## 2016-03-22 NOTE — ED Provider Notes (Signed)
CSN: [id number]     Arrival date & time 03/22/16  0047 History  By signing my name below, I, [facility], attest that this documentation has been prepared under the direction and in the presence of Kathryn Batonourtney F Analyn Matusek, MD. Electronically Signed: Randell PatientMarrissa Holland, ED Scribe. 03/22/2016. 3:34 AM.    Chief Complaint  Holland presents with  . Suicidal  . Medical Clearance  . Extremity Laceration   The history is provided by the Holland. No language interpreter was used.   HPI Comments: Kathryn PlaneSarah Holland is a 30 y.o. female who presents to the Emergency Department complaining of moderate, not actively bleeding, self-inflicted left forearm laceration that occurred earlier tonight. Pt states that she has been more depressed recently and that she drank a couple of beers earlier tonight after which she lacerated her left forearm with a knife. She reports gradually worsening dysphoric mood and SI. Denies recent hospitalization for similar symptoms. Denies self-injury in the past. Denies illicit drug use. Denies any other symptoms currently.  Past Medical History  Diagnosis Date  . Kidney stone   . Gallstones   . Ovarian cyst    Past Surgical History  Procedure Laterality Date  . Lithotripsy     No family history on file. Social History  Substance Use Topics  . Smoking status: Current Every Day Smoker -- 0.50 packs/day    Types: Cigarettes  . Smokeless tobacco: None  . Alcohol Use: Yes     Comment: socially   OB History    Gravida Para Term Preterm AB TAB SAB Ectopic Multiple Living   1              Review of Systems  Skin: Positive for wound.  Psychiatric/Behavioral: Positive for suicidal ideas, self-injury and dysphoric mood.  All other systems reviewed and are negative.  Allergies  Stadol  Home Medications   Prior to Admission medications   Medication Sig Start Date End Date Taking? Authorizing Provider  venlafaxine XR (EFFEXOR-XR) 75 MG 24 hr capsule Take 150 mg by  mouth every evening.    Yes Historical Provider, MD  docusate sodium (COLACE) 100 MG capsule Take 1 capsule (100 mg total) by mouth 2 (two) times daily as needed for mild constipation or moderate constipation. Holland not taking: Reported on 03/22/2016 12/07/14   Trixie DredgeEmily West, PA-C  HYDROcodone-acetaminophen (NORCO/VICODIN) 5-325 MG per tablet Take 1 tablet by mouth every 6 (six) hours as needed for moderate pain or severe pain. Holland not taking: Reported on 12/07/2014 05/22/14   Roxy Horsemanobert Browning, PA-C  ibuprofen (ADVIL,MOTRIN) 600 MG tablet Take 1 tablet (600 mg total) by mouth every 6 (six) hours as needed. Holland not taking: Reported on 12/07/2014 02/15/14   Antony MaduraKelly Humes, PA-C   BP 123/91 mmHg  Pulse 115  Temp(Src) 98.4 F (36.9 C) (Oral)  Resp 20  Ht 5\' 8"  (1.727 m)  SpO2 93% Physical Exam  Constitutional: She is oriented to person, place, and time. She appears well-developed and well-nourished.  Sleeping on my arrival  HENT:  Head: Normocephalic and atraumatic.  Injected conjunctiva  Eyes: Pupils are equal, round, and reactive to light.  Cardiovascular: Normal rate, regular rhythm and normal heart sounds.   No murmur heard. Pulmonary/Chest: Effort normal and breath sounds normal. No respiratory distress. She has no wheezes.  Neurological: She is alert and oriented to person, place, and time.  Skin: Skin is warm and dry.  Gaping 7 cm laceration over the left wrist, no active bleeding, multiple superficial abrasions, flexion  and extension of the wrist and fingers intact, no significant surrounding erythema  Psychiatric: She has a normal mood and affect.  Nursing note and vitals reviewed.   ED Course  Procedures (including critical care time)  LACERATION REPAIR Performed by: Kathryn Holland Authorized by: Kathryn Holland Consent: Verbal consent obtained. Risks and benefits: risks, benefits and alternatives were discussed Consent given by: Holland Holland identity confirmed:  provided demographic data Prepped and Draped in normal sterile fashion Wound explored  Laceration Location: left wrist  Laceration Length: 7cm  No Foreign Bodies seen or palpated  Anesthesia: local infiltration  Local anesthetic: lidocaine 2% w epinephrine  Anesthetic total: 5 ml  Irrigation method: syringe Amount of cleaning: standard  Skin closure: 4-0 vicryl, 4-0 nylon  Number of sutures: 4  Technique: 1 deep, 2 mattress, 2 interrupted   Holland tolerance: Holland tolerated the procedure well with no immediate complications.   DIAGNOSTIC STUDIES: Oxygen Saturation is 93% on RA, adequate by my interpretation.    COORDINATION OF CARE: 2:38 AM Ordered labs. Will perform laceration repair. Discussed treatment plan with pt at bedside and pt agreed to plan.  3:25 AM Returned to perform laceration repair.  Labs Review Labs Reviewed  COMPREHENSIVE METABOLIC PANEL - Abnormal; Notable for the following:    CO2 21 (*)    Glucose, Bld 124 (*)    Calcium 8.6 (*)    AST 47 (*)    ALT 65 (*)    Total Bilirubin <0.1 (*)    All other components within normal limits  ETHANOL - Abnormal; Notable for the following:    Alcohol, Ethyl (B) 228 (*)    All other components within normal limits  ACETAMINOPHEN LEVEL - Abnormal; Notable for the following:    Acetaminophen (Tylenol), Serum <10 (*)    All other components within normal limits  CBC - Abnormal; Notable for the following:    WBC 17.2 (*)    Platelets 435 (*)    All other components within normal limits  SALICYLATE LEVEL  URINE RAPID DRUG SCREEN, HOSP PERFORMED  URINALYSIS, ROUTINE W REFLEX MICROSCOPIC (NOT AT [facility])  POC URINE PREG, ED    Imaging Review No results found. I have personally reviewed and evaluated these images and lab results as part of my medical decision-making.   EKG Interpretation None      MDM   Final diagnoses:  Suicidal behavior  Wrist laceration, left, initial encounter    Holland  presents with self injury and intoxication. Alcohol level 228. Denies any other symptoms.  Laceration repaired without difficulty. She will need suture removal in 7-10 days. Medical screening test reassuring. She does have an isolated leukocytosis. No infectious symptoms. No further workup needed at this time.  TTS pending  I personally performed the services described in this documentation, which was scribed in my presence. The recorded information has been reviewed and is accurate.    Kathryn Baton, MD 03/22/16 [phone]

## 2016-03-22 NOTE — BHH Counselor (Signed)
IT ticket called in for cart 1 at 0800  03-22-16. Kathryn Holland K. Brittnee Gaetano, LCAS-A, LPC-A, [facility]  Counselor 03/22/2016 8:15 AM   .

## 2016-03-22 NOTE — BH Assessment (Addendum)
Tele Assessment Note   Kathryn Holland is an 30 y.o. female. [facility] because of a SI attempt. Pt cut her wrists last night. Pt denies HI. Pt denies AVH. Pt states she has been experiencing increased depression since her mother passed away. Pt reports drinking a 12 pack of beer last night. Pt's alcohol level was 228. [facility]. Pt is prescribed Effexor. Pt denies taking her prescribed medication. Pt denies previous hospitalizations. Pt denies abuse.   Diagnosis:  F33.2 MDD, recurrent, severe  Past Medical History:  Past Medical History  Diagnosis Date  . Kidney stone   . Gallstones   . Ovarian cyst     Past Surgical History  Procedure Laterality Date  . Lithotripsy      Family History: No family history on file.  Social History:  reports that she has been smoking Cigarettes.  She has been smoking about 0.50 packs per day. She does not have any smokeless tobacco history on file. She reports that she drinks alcohol. She reports that she does not use illicit drugs.  Additional Social History:  Alcohol / Drug Use Pain Medications: Pt denies Prescriptions: Pt denies Over the Counter: Pt denies History of alcohol / drug use?: Yes Longest period of sobriety (when/how long): 5 months Negative Consequences of Use: Financial, Legal, Personal relationships, Work / School Substance #1 Name of Substance 1: alchohol 1 - Age of First Use: unknown 1 - Amount (size/oz): 12 pack 1 - Frequency: reports occasional 1 - Duration: ongoing 1 - Last Use / Amount: 03/21/16  CIWA: CIWA-Ar BP: 115/76 mmHg Pulse Rate: 120 COWS:    PATIENT STRENGTHS: (choose at least two) Average or above average intelligence Communication skills  Allergies:  Allergies  Allergen Reactions  . Stadol [Butorphanol Tartrate] Other (See Comments)    Low heart rate    Home Medications:  (Not in a hospital  admission)  OB/GYN Status:  No LMP recorded. Patient has had an implant.  General Assessment Data Location of Assessment: [facility] ED TTS Assessment: In system Is this a Tele or Face-to-Face Assessment?: Face-to-Face Is this an Initial Assessment or a Re-assessment for this encounter?: Initial Assessment Marital status: Single Maiden name: NA Is patient pregnant?: No Pregnancy Status: No Living Arrangements: Spouse/significant other Can pt return to current living arrangement?: Yes Admission Status: Voluntary Is patient capable of signing voluntary admission?: Yes Referral Source: Self/Family/Friend Insurance type: SP     Crisis Care Plan Living Arrangements: Spouse/significant other Legal Guardian: Other: (self) Name of Psychiatrist: NA Name of Therapist: NA  Education Status Is patient currently in school?: No Current Grade: NA Highest grade of school patient has completed: 12 Name of school: NA Contact person: NA  Risk to self with the past 6 months Suicidal Ideation: Yes-Currently Present Has patient been a risk to self within the past 6 months prior to admission? : No Suicidal Intent: No Has patient had any suicidal intent within the past 6 months prior to admission? : No Is patient at risk for suicide?: Yes Suicidal Plan?: Yes-Currently Present Has patient had any suicidal plan within the past 6 months prior to admission? : Yes Specify Current Suicidal Plan: Pt cut her wrist Access to Means: No What has been your use of drugs/alcohol within the last 12 months?: alcohol Previous Attempts/Gestures: No How many times?: 0 Other Self Harm Risks: alcohol abuse Triggers for Past Attempts: None known Intentional Self Injurious Behavior:  None Family Suicide History: No Recent stressful life event(s): Loss (Comment) (loss of her mother) Persecutory voices/beliefs?: No Depression: Yes Depression Symptoms: Despondent, Insomnia, Tearfulness, Loss of interest in usual  pleasures, Feeling worthless/self pity, Feeling angry/irritable, Isolating Substance abuse history and/or treatment for substance abuse?: No Suicide prevention information given to non-admitted patients: Not applicable  Risk to Others within the past 6 months Homicidal Ideation: No Does patient have any lifetime risk of violence toward others beyond the six months prior to admission? : No Thoughts of Harm to Others: No Current Homicidal Intent: No Current Homicidal Plan: No Access to Homicidal Means: No Identified Victim: NA History of harm to others?: No Assessment of Violence: None Noted Violent Behavior Description: NA Does patient have access to weapons?: No Criminal Charges Pending?: No Does patient have a court date: No Is patient on probation?: No  Psychosis Hallucinations: None noted Delusions: None noted  Mental Status Report Appearance/Hygiene: Unremarkable Eye Contact: Fair Motor Activity: Freedom of movement Speech: Logical/coherent Level of Consciousness: Alert Mood: Depressed, Sad Affect: Depressed, Sad Anxiety Level: Minimal Thought Processes: Coherent, Relevant Judgement: Unimpaired Orientation: Person, Place, Time, Situation, Appropriate for developmental age Obsessive Compulsive Thoughts/Behaviors: None  Cognitive Functioning Concentration: Normal Memory: Recent Intact, Remote Intact IQ: Average Insight: Poor Impulse Control: Poor Appetite: Fair Weight Loss: 0 Weight Gain: 0 Sleep: Decreased Total Hours of Sleep: 5 Vegetative Symptoms: None  ADLScreening [facility] Assessment Services) Patient's cognitive ability adequate to safely complete daily activities?: Yes Patient able to express need for assistance with ADLs?: Yes Independently performs ADLs?: Yes (appropriate for developmental age)  Prior Inpatient Therapy Prior Inpatient Therapy: No Prior Therapy Dates: NA Prior Therapy Facilty/Provider(s): NA Reason for Treatment: NA  Prior  Outpatient Therapy Prior Outpatient Therapy: Yes Prior Therapy Dates: 2017 Prior Therapy Facilty/Provider(s): [facility] Reason for Treatment: alcohol Does patient have an ACCT team?: No Does patient have Intensive In-House Services?  : No Does patient have [facility] services? : No Does patient have P4CC services?: No  ADL Screening (condition at time of admission) Patient's cognitive ability adequate to safely complete daily activities?: Yes Is the patient deaf or have difficulty hearing?: No Does the patient have difficulty seeing, even when wearing glasses/contacts?: No Does the patient have difficulty concentrating, remembering, or making decisions?: No Patient able to express need for assistance with ADLs?: Yes Does the patient have difficulty dressing or bathing?: No Independently performs ADLs?: Yes (appropriate for developmental age) Does the patient have difficulty walking or climbing stairs?: No Weakness of Legs: None Weakness of Arms/Hands: None       Abuse/Neglect Assessment (Assessment to be complete while patient is alone) Physical Abuse: Denies Verbal Abuse: Denies Sexual Abuse: Denies Exploitation of patient/patient's resources: Denies Self-Neglect: Denies Values / Beliefs Cultural Requests During Hospitalization: None Spiritual Requests During Hospitalization: None   Advance Directives (For Healthcare) Does patient have an advance directive?: No Would patient like information on creating an advanced directive?: No - patient declined information    Additional Information 1:1 In Past 12 Months?: No CIRT Risk: No Elopement Risk: No Does patient have medical clearance?: Yes     Disposition:  Disposition Initial Assessment Completed for this Encounter: Yes Disposition of Patient: Inpatient treatment program Type of inpatient treatment program: Adult  Ting Cage D 03/22/2016 9:20 AM

## 2016-03-22 NOTE — Tx Team (Signed)
Initial Interdisciplinary Treatment Plan   PATIENT STRESSORS: Loss of mother and brother in law   PATIENT STRENGTHS: Ability for insight Average or above average intelligence Communication skills Motivation for treatment/growth Supportive family/friends   PROBLEM LIST: Problem List/Patient Goals Date to be addressed Date deferred Reason deferred Estimated date of resolution  At risk for suicide 03/22/2016  03/22/2016   D/C  Depression 03/22/2016  03/22/2016   D/C  Grieving 03/22/2016  03/22/2016   D/C  "Get deep under control" 03/22/2016  03/22/2016   D/C  "Learn to cope with things and get the help I need" 03/22/2016  03/22/2016   D/C                           DISCHARGE CRITERIA:  Improved stabilization in mood, thinking, and/or behavior Medical problems require only outpatient monitoring Motivation to continue treatment in a less acute level of care Need for constant or close observation no longer present Reduction of life-threatening or endangering symptoms to within safe limits  PRELIMINARY DISCHARGE PLAN: Attend 12-step recovery group Outpatient therapy Return to previous living arrangement  PATIENT/FAMIILY INVOLVEMENT: This treatment plan has been presented to and reviewed with the patient, Kathryn Holland.  The patient and family have been given the opportunity to ask questions and make suggestions.  Larry SierrasMiddleton, Shanquita Ronning P 03/22/2016, 6:50 PM

## 2016-03-22 NOTE — BH Assessment (Signed)
BHH Assessment Progress Note  Per Carolanne GrumblingGerald Taylor, MD, this pt requires psychiatric hospitalization at this time.  Lillia AbedLindsay, RN, [facility] Rm [id number].  Pt has signed Voluntary Admission and Consent for Treatment, as well as Consent to Release Information to several family members, and signed forms [facility].  Pt's nurse, Morrie Sheldonshley, has been notified, and agrees to send original paperwork along with pt via [facility], and to call report to [phone].  Doylene Canninghomas Keily Lepp, MA Triage Specialist [phone]

## 2016-03-22 NOTE — ED Notes (Signed)
[facility] at facility [facility] per MD order. Pt signed for personal belongings and personal belongings given to [facility] for transfer. Pt signed e-signature. Denies pain at discharge. Ambulatory off unit with [facility].

## 2016-03-22 NOTE — Progress Notes (Signed)
Adult Psychoeducational Group Note  Date:  03/22/2016 Time:  8:26 PM  Group Topic/Focus:  NA Group  Participation Level:  Minimal  Participation Quality:  Appropriate  Affect:  Appropriate  Cognitive:  Appropriate  Insight: Appropriate  Engagement in Group:  Developing/Improving  Modes of Intervention:    Additional Comments:  Patient attended group  Lyndee HensenGoins, Eulalio Reamy R 03/22/2016, 8:26 PM

## 2016-03-23 ENCOUNTER — Encounter (HOSPITAL_COMMUNITY): Payer: Self-pay | Admitting: Psychiatry

## 2016-03-23 DIAGNOSIS — F332 Major depressive disorder, recurrent severe without psychotic features: Secondary | ICD-10-CM | POA: Diagnosis present

## 2016-03-23 DIAGNOSIS — F1020 Alcohol dependence, uncomplicated: Secondary | ICD-10-CM

## 2016-03-23 DIAGNOSIS — R45851 Suicidal ideations: Secondary | ICD-10-CM

## 2016-03-23 MED ORDER — ARIPIPRAZOLE 2 MG PO TABS
2.0000 mg | ORAL_TABLET | Freq: Every day | ORAL | Status: DC
  Administered 2016-03-23 – 2016-03-24 (×2): 2 mg via ORAL
  Filled 2016-03-23 (×4): qty 1

## 2016-03-23 MED ORDER — VENLAFAXINE HCL ER 75 MG PO CP24
75.0000 mg | ORAL_CAPSULE | Freq: Once | ORAL | Status: AC
  Administered 2016-03-23: 75 mg via ORAL
  Filled 2016-03-23: qty 1

## 2016-03-23 MED ORDER — BACITRACIN ZINC 500 UNIT/GM EX OINT
TOPICAL_OINTMENT | Freq: Two times a day (BID) | CUTANEOUS | Status: DC
  Administered 2016-03-24 – 2016-03-27 (×8): via TOPICAL
  Filled 2016-03-23: qty 28.35

## 2016-03-23 MED ORDER — VENLAFAXINE HCL ER 150 MG PO CP24
150.0000 mg | ORAL_CAPSULE | Freq: Every day | ORAL | Status: DC
  Filled 2016-03-23: qty 1

## 2016-03-23 MED ORDER — VENLAFAXINE HCL ER 75 MG PO CP24
225.0000 mg | ORAL_CAPSULE | Freq: Every day | ORAL | Status: DC
  Administered 2016-03-24: 225 mg via ORAL
  Filled 2016-03-23 (×3): qty 3

## 2016-03-23 NOTE — Progress Notes (Signed)
Kathryn SagoSarah has been active in the milieu with other patients however she has been minimally interative with staff. She is pleasant and cooperative. Still endorsing high anxiety and depression. There is concern that CPS will become involved and she may lose her son. She will not go into detail regarding any specifics as to why CPS would become involved or the dynamics between her and her son's father whom the child is currently with. Denies SI/HI/AVH. Encouragement and support given. Medications administered as prescribed. Continue Q 15 minute checks for patient safety and medication effectiveness.

## 2016-03-23 NOTE — Progress Notes (Signed)
Patient did not attend the evening karaoke group. Pt was with nurse doing an EKG when group began and remained on unit.

## 2016-03-23 NOTE — Progress Notes (Signed)
D). Patient stayed mostly isolated, to her room, this shift.She verbally denies SI/HI/AVH, but she does state that she has extreme stress because she fears that her child will be taken away, because of her recent SA. P). Emotional support and encouragement offered. Education provided on medication, indications and side effect. R). Continue to maintain safety on the unit. Continue to take medications.s as prescribed. EKG completed and report placed on chart.

## 2016-03-23 NOTE — BHH Counselor (Signed)
CSW attempted to meet with pt to complete Psychosocial Assessment and discuss aftercare. Pt is sleeping in room/difficult to rouse. CSW will try again this afternoon.  Trula SladeHeather Smart, MSW, LCSW Clinical Social Worker 03/23/2016 10:18 AM

## 2016-03-23 NOTE — Tx Team (Signed)
Interdisciplinary Treatment Plan Update (Adult)  Date:  03/23/2016  Time Reviewed:  8:51 AM   Progress in Treatment: Attending groups: No. New to unit. Continuing to assess.  Participating in groups:  No. Taking medication as prescribed:  Yes. Tolerating medication:  Yes. Family/Significant othe contact made:  SPE required for this pt.  Patient understands diagnosis:  Yes. and As evidenced by:  seeking treatment SI, depression/grief over death of her mother recently, alcohol abuse, and medication management.  Discussing patient identified problems/goals with staff:  Yes. Medical problems stabilized or resolved:  Yes. Denies suicidal/homicidal ideation: Yes. Issues/concerns per patient self-inventory:  Other:  Discharge Plan or Barriers: CSW assessing for appropriate referrals. Pt current with [facility] for outpatient mental health services/medication management.   Reason for Continuation of Hospitalization: Depression Medication stabilization Withdrawal symptoms  Comments:  Kathryn Holland is an 30 y.o. female. Pt was transported by EMS to [facility] because of a SI attempt. Pt cut her wrists last night. Pt denies HI. Pt denies AVH. Pt states she has been experiencing increased depression since her mother passed away. Pt reports drinking a 12 pack of beer last night. Pt's alcohol level was 228. Pt reports receiving outpatient treatment from [facility] for depression and alcoholism. Pt is prescribed Effexor. Pt denies taking her prescribed medication. Pt denies previous hospitalizations. Pt denies abuse.  F33.2 MDD, recurrent, severe  Estimated length of stay: 3-5 days    New goal(s): to develop effective aftercare plan.   Additional Comments:  Patient and CSW reviewed pt's identified goals and treatment plan. Patient verbalized understanding and agreed to treatment plan. CSW reviewed [facility] "Discharge Process and Patient Involvement" Form. Pt verbalized understanding of information provided and  signed form.    Review of initial/current patient goals per problem list:  1. Goal(s): Patient will participate in aftercare plan  Met: No--Goal progressing.   Target date: at discharge  As evidenced by: Patient will participate within aftercare plan AEB aftercare provider and housing plan at discharge being identified.  5/25: CSW assessing. Pt current with [facility] for outpatient mental health services.   2. Goal (s): Patient will exhibit decreased depressive symptoms and suicidal ideations.  Met: No.    Target date: at discharge  As evidenced by: Patient will utilize self rating of depression at 3 or below and demonstrate decreased signs of depression or be deemed stable for discharge by MD.  5/25: Pt rates depression as high but denies SI/HI/AVH today.   3. Goal(s): Patient will demonstrate decreased signs of withdrawal due to substance abuse  Met:No.   Target date:at discharge   As evidenced by: Patient will produce a CIWA/COWS score of 0, have stable vitals signs, and no symptoms of withdrawal.  5/25: Pt reports mild withdrawals with CIWA of 3 and stable vitals.   Attendees: Patient:   03/23/2016 8:51 AM   Family:   03/23/2016 8:51 AM   Physician:  Dr. Cristy Friedlander MD 03/23/2016 8:51 AM   Nursing:   Lloyd Huger RN 03/23/2016 8:51 AM   Clinical Social Worker: Maxie Better, LCSW 03/23/2016 8:51 AM   Clinical Social Worker: Erasmo Downer Drinkard LCSW; Peri Maris LCSWA 03/23/2016 8:51 AM   Other:  Gerline Legacy Nurse Case Manager 03/23/2016 8:51 AM   Other:  Agustina Caroli NP  03/23/2016 8:51 AM   Other:   03/23/2016 8:51 AM   Other:  03/23/2016 8:51 AM   Other:  03/23/2016 8:51 AM   Other:  03/23/2016 8:51 AM    03/23/2016 8:51 AM  03/23/2016 8:51 AM    03/23/2016 8:51 AM    03/23/2016 8:51 AM    Scribe for Treatment Team:   Maxie Better, LCSW 03/23/2016 8:51 AM

## 2016-03-23 NOTE — H&P (Signed)
Psychiatric Admission Assessment Adult  Patient Identification: Kathryn Holland MRN:  [id number] Date of Evaluation:  03/23/2016 Chief Complaint:  MDD Principal Diagnosis: Major Depressive Disorder, recurrent, severe without psychotic features: Diagnosis:   Patient Active Problem List   Diagnosis Date Noted  . Alcohol use disorder, severe, dependence ([place]) [F10.20] 03/22/2016   History of Present Illness: 30 yo female who presented to the ED after drinking alcohol and cutting her wrist in a suicide attempt, history of depression and alcohol dependence. Her depression has been increasing since her mother died two years ago, especially the last six months since she had stopped drinking alcohol to cope and having her ADHD discontinued.  Last night, her depression increased after a verbal altercation with her fiance and she started drinking.  She drank a 12-pack of beer and went into a deep depression with suicidal ideations, cut her left  wrist which required stitches.  Today, she continues to have feelings of worthlessness, hopelessness, fatigue, self-isolating, and "tired of being depressed."  Tearful on assessment.  Reports her ADHD was discontinued by [place], her outpatient provider, six months ago and she has lost her ability to focus along with motivation.  She has been isolating and not doing anything around the house.  When her fiance returns from work, he gets frustrated with her lack of initiative and calls her lazy with needing to get help.  Kathryn Holland has been offered a job at [employer] in [place] with Daycare availability across the street for her 57 yo son.  The only time she smiles is when discussing her son.  He was with her sister last night when the above incidences occurred.  Motivated to get help for her depression and feel better.  Reports her Effexor helps her anxiety but not depression.    Associated Signs/Symptoms: Depression Symptoms:  depressed  mood, anhedonia, fatigue, feelings of worthlessness/guilt, difficulty concentrating, hopelessness, impaired memory, suicidal thoughts without plan, suicidal attempt, loss of energy/fatigue, disturbed sleep, decreased appetite, (Hypo) Manic Symptoms:  none Anxiety Symptoms:  none Psychotic Symptoms:  none PTSD Symptoms: NA Total Time spent with patient: 1 hour  Past Psychiatric History: depression, alcohol dependnece  Is the patient at risk to self? Yes.    Has the patient been a risk to self in the past 6 months? Yes.    Has the patient been a risk to self within the distant past? No.  Is the patient a risk to others? No.  Has the patient been a risk to others in the past 6 months? No.  Has the patient been a risk to others within the distant past? No.   Prior Inpatient Therapy:   Prior Outpatient Therapy:    Alcohol Screening: 1. How often do you have a drink containing alcohol?: Monthly or less 2. How many drinks containing alcohol do you have on a typical day when you are drinking?: 1 or 2 3. How often do you have six or more drinks on one occasion?: Less than monthly Preliminary Score: 1 Brief Intervention: AUDIT score less than 7 or less-screening does not suggest unhealthy drinking-brief intervention not indicated Substance Abuse History in the last 12 months:  Yes.   Consequences of Substance Abuse: Family Consequences:  verbal altercation Previous Psychotropic Medications: No  Psychological Evaluations: Yes  Past Medical History:  Past Medical History  Diagnosis Date  . Kidney stone   . Gallstones   . Ovarian cyst     Past Surgical History  Procedure Laterality Date  . Lithotripsy  Family History: History reviewed. No pertinent family history. Family Psychiatric  History: Father-alcohol & depression, 1 brother-alcohol, depression, anxiety, 1 brother-bipolar disorder, sister-alcohol dependence, anxiety, brother-ADHD, depression Tobacco Screening:  '@FLOW'$ ([phone])::1)@ Social History:  History  Alcohol Use  . Yes    Comment: socially     History  Drug Use No    Additional Social History:                           Allergies:   Allergies  Allergen Reactions  . Stadol [Butorphanol Tartrate] Other (See Comments)    Low heart rate   Lab Results:  Results for orders placed or performed during the hospital encounter of 03/22/16 (from the past 48 hour(s))  Rapid urine drug screen (hospital performed)     Status: None   Collection Time: 03/22/16  1:11 AM  Result Value Ref Range   Opiates NONE DETECTED NONE DETECTED   Cocaine NONE DETECTED NONE DETECTED   Benzodiazepines NONE DETECTED NONE DETECTED   Amphetamines NONE DETECTED NONE DETECTED   Tetrahydrocannabinol NONE DETECTED NONE DETECTED   Barbiturates NONE DETECTED NONE DETECTED    Comment:        DRUG SCREEN FOR MEDICAL PURPOSES ONLY.  IF CONFIRMATION IS NEEDED FOR ANY PURPOSE, NOTIFY LAB WITHIN 5 DAYS.        LOWEST DETECTABLE LIMITS FOR URINE DRUG SCREEN Drug Class       Cutoff (ng/mL) Amphetamine      1000 Barbiturate      200 Benzodiazepine   119 Tricyclics       417 Opiates          300 Cocaine          300 THC              50   Urinalysis, Routine w reflex microscopic (not at [facility])     Status: None   Collection Time: 03/22/16  1:11 AM  Result Value Ref Range   Color, Urine YELLOW YELLOW   APPearance CLEAR CLEAR   Specific Gravity, Urine 1.009 1.005 - 1.030   pH 5.0 5.0 - 8.0   Glucose, UA NEGATIVE NEGATIVE mg/dL   Hgb urine dipstick NEGATIVE NEGATIVE   Bilirubin Urine NEGATIVE NEGATIVE   Ketones, ur NEGATIVE NEGATIVE mg/dL   Protein, ur NEGATIVE NEGATIVE mg/dL   Nitrite NEGATIVE NEGATIVE   Leukocytes, UA NEGATIVE NEGATIVE    Comment: MICROSCOPIC NOT DONE ON URINES WITH NEGATIVE PROTEIN, BLOOD, LEUKOCYTES, NITRITE, OR GLUCOSE <1000 mg/dL.  Comprehensive metabolic panel     Status: Abnormal   Collection Time: 03/22/16  1:16 AM  Result  Value Ref Range   Sodium 137 135 - 145 mmol/L   Potassium 3.5 3.5 - 5.1 mmol/L   Chloride 106 101 - 111 mmol/L   CO2 21 (L) 22 - 32 mmol/L   Glucose, Bld 124 (H) 65 - 99 mg/dL   BUN 8 6 - 20 mg/dL   Creatinine, Ser 0.72 0.44 - 1.00 mg/dL   Calcium 8.6 (L) 8.9 - 10.3 mg/dL   Total Protein 7.7 6.5 - 8.1 g/dL   Albumin 4.4 3.5 - 5.0 g/dL   AST 47 (H) 15 - 41 U/L   ALT 65 (H) 14 - 54 U/L   Alkaline Phosphatase 73 38 - 126 U/L   Total Bilirubin <0.1 (L) 0.3 - 1.2 mg/dL   GFR calc non Af Amer >60 >60 mL/min   GFR calc Af Amer >60 >60  mL/min    Comment: (NOTE) The eGFR has been calculated using the CKD EPI equation. This calculation has not been validated in all clinical situations. eGFR's persistently <60 mL/min signify possible Chronic Kidney Disease.    Anion gap 10 5 - 15  Ethanol     Status: Abnormal   Collection Time: 03/22/16  1:16 AM  Result Value Ref Range   Alcohol, Ethyl (B) 228 (H) <5 mg/dL    Comment:        LOWEST DETECTABLE LIMIT FOR SERUM ALCOHOL IS 5 mg/dL FOR MEDICAL PURPOSES ONLY   Salicylate level     Status: None   Collection Time: 03/22/16  1:16 AM  Result Value Ref Range   Salicylate Lvl <0.6 2.8 - 30.0 mg/dL  Acetaminophen level     Status: Abnormal   Collection Time: 03/22/16  1:16 AM  Result Value Ref Range   Acetaminophen (Tylenol), Serum <10 (L) 10 - 30 ug/mL    Comment:        THERAPEUTIC CONCENTRATIONS VARY SIGNIFICANTLY. A RANGE OF 10-30 ug/mL MAY BE AN EFFECTIVE CONCENTRATION FOR MANY PATIENTS. HOWEVER, SOME ARE BEST TREATED AT CONCENTRATIONS OUTSIDE THIS RANGE. ACETAMINOPHEN CONCENTRATIONS >150 ug/mL AT 4 HOURS AFTER INGESTION AND >50 ug/mL AT 12 HOURS AFTER INGESTION ARE OFTEN ASSOCIATED WITH TOXIC REACTIONS.   cbc     Status: Abnormal   Collection Time: 03/22/16  1:16 AM  Result Value Ref Range   WBC 17.2 (H) 4.0 - 10.5 K/uL   RBC 4.70 3.87 - 5.11 MIL/uL   Hemoglobin 14.4 12.0 - 15.0 g/dL   HCT 40.2 36.0 - 46.0 %   MCV 85.5  78.0 - 100.0 fL   MCH 30.6 26.0 - 34.0 pg   MCHC 35.8 30.0 - 36.0 g/dL   RDW 12.5 11.5 - 15.5 %   Platelets 435 (H) 150 - 400 K/uL  POC urine preg, ED     Status: None   Collection Time: 03/22/16  1:21 AM  Result Value Ref Range   Preg Test, Ur NEGATIVE NEGATIVE    Comment:        THE SENSITIVITY OF THIS METHODOLOGY IS >24 mIU/mL     Blood Alcohol level:  Lab Results  Component Value Date   ETH 228* 23/76/2831    Metabolic Disorder Labs:  No results found for: HGBA1C, MPG No results found for: PROLACTIN No results found for: CHOL, TRIG, HDL, CHOLHDL, VLDL, LDLCALC  Current Medications: Current Facility-Administered Medications  Medication Dose Route Frequency Provider Last Rate Last Dose  . acetaminophen (TYLENOL) tablet 650 mg  650 mg Oral Q6H PRN Jenne Campus, MD   650 mg at 03/23/16 0857  . hydrOXYzine (ATARAX/VISTARIL) tablet 25 mg  25 mg Oral Q6H PRN Delfin Gant, NP   25 mg at 03/22/16 2213  . loperamide (IMODIUM) capsule 2-4 mg  2-4 mg Oral PRN Delfin Gant, NP      . LORazepam (ATIVAN) tablet 1 mg  1 mg Oral Q6H PRN Delfin Gant, NP      . multivitamin with minerals tablet 1 tablet  1 tablet Oral Daily Delfin Gant, NP   1 tablet at 03/23/16 0854  . nicotine (NICODERM CQ - dosed in mg/24 hr) patch 7 mg  7 mg Transdermal Daily Jenne Campus, MD   7 mg at 03/23/16 0854  . nicotine polacrilex (NICORETTE) gum 2 mg  2 mg Oral PRN Jenne Campus, MD      . ondansetron (  ZOFRAN-ODT) disintegrating tablet 4 mg  4 mg Oral Q6H PRN Delfin Gant, NP      . thiamine (VITAMIN B-1) tablet 100 mg  100 mg Oral Daily Delfin Gant, NP   100 mg at 03/23/16 0854  . traZODone (DESYREL) tablet 50 mg  50 mg Oral QHS,MR X 1 Laverle Hobby, PA-C   50 mg at 03/22/16 2213  . venlafaxine XR (EFFEXOR-XR) 24 hr capsule 75 mg  75 mg Oral Q breakfast Delfin Gant, NP   75 mg at 03/23/16 3825   PTA Medications: Prescriptions prior to admission   Medication Sig Dispense Refill Last Dose  . docusate sodium (COLACE) 100 MG capsule Take 1 capsule (100 mg total) by mouth 2 (two) times daily as needed for mild constipation or moderate constipation. (Patient not taking: Reported on 03/22/2016) 30 capsule 0 Not Taking at Unknown time  . venlafaxine XR (EFFEXOR-XR) 75 MG 24 hr capsule Take 150 mg by mouth every evening.    03/22/2016 at Unknown time    Musculoskeletal: Strength & Muscle Tone: within normal limits Gait & Station: normal Patient leans: N/A  Psychiatric Specialty Exam: Physical Exam  Constitutional: She is oriented to person, place, and time. She appears well-developed and well-nourished.  HENT:  Head: Normocephalic.  Eyes: Pupils are equal, round, and reactive to light.  Neck: Normal range of motion.  Cardiovascular: Normal rate.   Respiratory: Effort normal.  GI: Soft.  Genitourinary:  No complaints, deferred exam  Musculoskeletal: Normal range of motion.  Neurological: She is alert and oriented to person, place, and time.  Skin: Skin is warm and dry.  Psychiatric: Her speech is normal. She is slowed and withdrawn. Cognition and memory are impaired. She expresses impulsivity. She exhibits a depressed mood. She expresses suicidal ideation.    Review of Systems  Constitutional: Negative.   HENT: Negative.   Eyes: Negative.   Respiratory: Negative.   Cardiovascular: Negative.   Gastrointestinal: Negative.   Genitourinary: Negative.   Musculoskeletal: Negative.   Skin: Negative.   Neurological: Negative.   Endo/Heme/Allergies: Negative.   Psychiatric/Behavioral: Positive for depression, suicidal ideas and substance abuse.    Blood pressure 122/87, pulse 84, temperature 98.6 F (37 C), temperature source Oral, resp. rate 16, height 5' 5.5" (1.664 m), weight 74.39 kg (164 lb).Body mass index is 26.87 kg/(m^2).  General Appearance: Disheveled  Eye Contact:  Fair  Speech:  Slow  Volume:  Decreased  Mood:   Depressed  Affect:  Congruent  Thought Process:  Coherent  Orientation:  Full (Time, Place, and Person)  Thought Content:  Rumination  Suicidal Thoughts:  Yes.  without intent/plan  Homicidal Thoughts:  No  Memory:  Immediate;   Fair Recent;   Fair Remote;   Fair  Judgement:  Impaired  Insight:  Fair  Psychomotor Activity:  Decreased  Concentration:  Concentration: Fair and Attention Span: Fair  Recall:  [place] of Knowledge:  Good  Language:  Good  Akathisia:  No  Handed:  Right  AIMS (if indicated):     Assets:  Housing Intimacy Leisure Time Physical Health Resilience  ADL's:  Intact  Cognition:  WNL  Sleep:  Number of Hours: 6.75   Treatment Plan Summary: Daily contact with patient to assess and evaluate symptoms and progress in treatment, Medication management and Plan major depressive disorder, recurrent, severe without psychosis:  -Crisis stabilization -Medications adjustments:  Continue Effexor 150 mg daily for depression/anxiety, start Abilify 2 mg daily for adjunctive  depressive medication, consider restarting ADHD medications. -Individual and group therapy -LOS 5-7 days -Substance abuse counseling  Observation Level/Precautions:  15 minute checks  Laboratory:  completed in ED, reviewed, stable  Psychotherapy:  Individual and group therapy  Medications:  Effexor 150 mg daily for depression/anxiety, start Abilify 2 mg daily for depression.  Consultations:  None   Discharge Concerns:  None    Estimated LOS:  5-7 days  Other:     I certify that inpatient services furnished can reasonably be expected to improve the patient's condition.    Waylan Boga, NP 5/25/201710:16 AM

## 2016-03-23 NOTE — [facility] Suicide Risk Assessment (Signed)
[facility] Admission Suicide Risk Assessment   Nursing information obtained from:  Patient Demographic factors:  Caucasian, Unemployed Current Mental Status:  Suicidal ideation indicated by patient, Suicide plan, Plan includes specific time, place, or method, Self-harm thoughts, Self-harm behaviors, Intention to act on suicide plan, Belief that plan would result in death Loss Factors:  Loss of significant relationship, Decline in physical health Historical Factors:  Family history of mental illness or substance abuse, Impulsivity Risk Reduction Factors:  Sense of responsibility to family, Living with another person, especially a relative, Positive social support  Total Time spent with patient: 30 minutes Principal Problem: Severe recurrent major depression without psychotic features ([facility]) Diagnosis:   Patient Active Problem List   Diagnosis Date Noted  . Severe recurrent major depression without psychotic features ([facility]) [F33.2] 03/23/2016  . Alcohol use disorder, severe, dependence ([facility]) [F10.20] 03/22/2016   Subjective Data: Pt states " I was so depressed, I wanted to die, I relapsed on alcohol after 6 months of being sober. I miss my mother , she passed away from cancer. I need help.'   Continued Clinical Symptoms:    The "Alcohol Use Disorders Identification Test", Guidelines for Use in Primary Care, Second Edition.  World Science writer [facility]). Score between 0-7:  no or low risk or alcohol related problems. Score between 8-15:  moderate risk of alcohol related problems. Score between 16-19:  high risk of alcohol related problems. Score 20 or above:  warrants further diagnostic evaluation for alcohol dependence and treatment.   CLINICAL FACTORS:   Depression:   Comorbid alcohol abuse/dependence Hopelessness Impulsivity Alcohol/Substance Abuse/Dependencies Previous Psychiatric Diagnoses and Treatments Medical Diagnoses and Treatments/Surgeries   Musculoskeletal: Strength &  Muscle Tone: within normal limits Gait & Station: normal Patient leans: N/A  Psychiatric Specialty Exam: Physical Exam  Nursing note and vitals reviewed.   Review of Systems  Psychiatric/Behavioral: Positive for depression, suicidal ideas (S/P suicide attempt) and substance abuse. The patient is nervous/anxious.   All other systems reviewed and are negative.   Blood pressure 122/87, pulse 84, temperature 98.6 F (37 C), temperature source Oral, resp. rate 16, height 5' 5.5" (1.664 m), weight 74.39 kg (164 lb).Body mass index is 26.87 kg/(m^2).  General Appearance: Guarded  Eye Contact:  Fair  Speech:  Normal Rate  Volume:  Decreased  Mood:  Anxious and Depressed  Affect:  Congruent  Thought Process:  Goal Directed  Orientation:  Full (Time, Place, and Person)  Thought Content:  Rumination  Suicidal Thoughts:  presented S/P Suicide attempt by slitting her wrist- currently denies it  Homicidal Thoughts:  No  Memory:  Immediate;   Fair Recent;   Fair Remote;   Fair  Judgement:  Fair  Insight:  Shallow  Psychomotor Activity:  Normal  Concentration:  Concentration: Fair and Attention Span: Fair  Recall:  [employer] of Knowledge:  Fair  Language:  Fair  Akathisia:  No  Handed:  Right  AIMS (if indicated):     Assets:  Communication Skills Desire for Improvement  ADL's:  Intact  Cognition:  WNL  Sleep:  Number of Hours: 6.75      COGNITIVE FEATURES THAT CONTRIBUTE TO RISK:  Closed-mindedness, Polarized thinking and Thought constriction (tunnel vision)    SUICIDE RISK:   Moderate:  Frequent suicidal ideation with limited intensity, and duration, some specificity in terms of plans, no associated intent, good self-control, limited dysphoria/symptomatology, some risk factors present, and identifiable protective factors, including available and accessible social support.  PLAN OF  CARE: Patient will benefit from inpatient treatment and stabilization.  Estimated length of stay  is 5-7 days.  Reviewed past medical records,treatment plan.  Will increase Effexor to 225 mg for affective sx - pt has been taking 150 mg at home. Will continue Abilify 2 mg po qhs for augmenting the effect of effexor. Will make available PRN medications as per agitation protocol. Will continue to monitor vitals ,medication compliance and treatment side effects while patient is here.  Will monitor for medical issues as well as call consult as needed.  Reviewed labs cbc - wbc slightly elevated , likely reactive, cmp - AST/ALT elevated - likely due to alcohol abuse , pregnancy test - negative , BAL - 228  ,will order tsh, lipid panel, hba1c, pl. CSW will start working on disposition.  Patient to participate in therapeutic milieu .       I certify that inpatient services furnished can reasonably be expected to improve the patient's condition.   Marchele Decock, MD 03/23/2016, 12:14 PM

## 2016-03-23 NOTE — Progress Notes (Signed)
[facility] Group Notes:  (Nursing/MHT/Case Management/Adjunct)  Date:  03/23/2016  Time:  8:44 PM  Type of Therapy:  Nurse Education  Participation Level:  Did Not Attend   Almira Barenny G Dalon Reichart 03/23/2016, 8:44 PM

## 2016-03-23 NOTE — [facility] Group Notes (Signed)
[facility] LCSW Group Therapy  03/23/2016 1:15 PM  Type of Therapy:  Group Therapy  Participation Level:  Active  Participation Quality:  Attentive and Sharing  Affect:  Depressed and Tearful  Cognitive:  Alert  Insight:  Developing/Improving  Engagement in Therapy:  Developing/Improving  Modes of Intervention:  Confrontation, Discussion, Education, Exploration, Problem-solving, Rapport Building, Socialization and Support  Summary of Progress/Problems:  Finding Balance in Life. Today's group focused on defining balance in one's own words, identifying things that can knock one off balance, and exploring healthy ways to maintain balance in life. Group members were asked to provide an example of a time when they felt off balance, describe how they handled that situation,and process healthier ways to regain balance in the future. Group members were asked to share the most important tool for maintaining balance that they [facility] and how they plan to apply this method after discharge. Patient acknowledges that she felt off balance in her sobriety yet did not wish to acknowledge her depression thus things truly became more off balance leading her to relapse. Patient is invested in remaining inpatient for stabilization.   Carney Bernatherine C [place] Lesure, LCSW

## 2016-03-24 LAB — LIPID PANEL
Cholesterol: 215 mg/dL — ABNORMAL HIGH (ref 0–200)
HDL: 52 mg/dL (ref >40)
LDL Cholesterol: 127 mg/dL — ABNORMAL HIGH (ref 0–99)
TRIGLYCERIDES: 179 mg/dL — AB (ref <150)
Total CHOL/HDL Ratio: 4.1 RATIO
VLDL: 36 mg/dL (ref 0–40)

## 2016-03-24 LAB — TSH: TSH: 2.454 u[IU]/mL (ref 0.350–4.500)

## 2016-03-24 MED ORDER — ARIPIPRAZOLE 2 MG PO TABS
2.0000 mg | ORAL_TABLET | Freq: Every day | ORAL | Status: DC
  Administered 2016-03-25 – 2016-03-27 (×3): 2 mg via ORAL
  Filled 2016-03-24: qty 7
  Filled 2016-03-24 (×4): qty 1

## 2016-03-24 MED ORDER — VENLAFAXINE HCL ER 75 MG PO CP24
225.0000 mg | ORAL_CAPSULE | Freq: Every day | ORAL | Status: DC
  Filled 2016-03-24 (×2): qty 1

## 2016-03-24 NOTE — [facility] Group Notes (Signed)
[facility] LCSW Group Therapy  03/24/2016 3:14 PM  Type of Therapy:  Group Therapy  Participation Level:  Did Not Attend-pt invited. Chose to rest in bed.   Summary of Progress/Problems: Today's Topic: Overcoming Obstacles. Patients identified one short term goal and potential obstacles in reaching this goal. Patients processed barriers involved in overcoming these obstacles. Patients identified steps necessary for overcoming these obstacles and explored motivation (internal and external) for facing these difficulties head on.   Smart, Briona Korpela LCSW 03/24/2016, 3:14 PM

## 2016-03-24 NOTE — [facility] Counselor (Signed)
Adult Comprehensive Assessment  Patient ID: Kathryn Holland, female   DOB: December 17, 1985, 30 y.o.   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 few close friends that are supportive Substance abuse: pt reports hx of alcohol abuse. currently she reports not drinking often "just socially" few times a month. reports that she got intoxicated while with a friend and cut wrists when she came home due to depression and intoxication. Bereavement / Loss: mother died 3 years ago; friend died of heart attack few years ago. Pt reports increased depression since the death of her mother.   Living/Environment/Situation:  Living Arrangements: Children, Spouse/significant other Living conditions (as described by patient or guardian): pt lives with her fiance of 7 1/2 years, her 4 yo son, and her 82 yo stepson How long has patient lived in current situation?: loving; safe; supportive  What is atmosphere in current home: Comfortable, Paramedic, Supportive  Family History:  Marital status: Long term relationship Long term relationship, how long?: 7 1/2 years What types of issues is patient dealing with in the relationship?: "He noticed I was getting more and more depressed and I decided to get help." Additional relationship information: n/a  Are you sexually active?: Yes What is your sexual orientation?: heterosexual Has your sexual activity been affected by  drugs, alcohol, medication, or emotional stress?: n/a  Does patient have children?: Yes How many children?: 2 How is patient's relationship with their children?: 63 yo daughter-adopted to foster family several years ago; no contact. pt has 43 yo son living with her. "We are very close. He is everything to me." 8yo stepson-good relationship.   Childhood History:  By whom was/is the patient raised?: Both parents Additional childhood history information: "My parents fought alot. It was an unhappy household and really stressful."  Description of patient's relationship with caregiver when they were a child: Close to both parents; pt reports her father was an alcoholic but has been sober since 1 year prior to her birth. Patient's description of current relationship with people who raised him/her: close to father; mother died 3 years ago from cancer. pt identifies her mother's death as trigger for increased depression.  How were you disciplined when you got in trouble as a child/adolescent?: hit sometimes; spanked; yelled at.  Does patient have siblings?: Yes Number of Siblings: 4 Description of patient's current relationship with siblings: pt is youngest of five children. closest to her older sister. "all my siblings are depressed and on medication."  Did patient suffer any verbal/emotional/physical/sexual abuse as a child?: No Did patient suffer from severe childhood neglect?: No Has patient ever been sexually abused/assaulted/raped as an adolescent or adult?: No Was the patient ever a victim of a crime or a disaster?: No Witnessed domestic violence?: No Has patient been effected by domestic violence as an adult?: No  Education:  Highest grade of school patient has completed: graduated from high school Currently a student?: No Name of school: n/a  Learning disability?: No  Employment/Work Situation:   Employment situation: Unemployed Where is patient currently employed?: n/a How long  has  patient been employed?: n/a  Patient's job has been impacted by current illness: No What is the longest time patient has a held a job?: 2 years 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  Has patient ever been in the [employer] and Companymilitary?: No Has patient ever served in Buyer, retailcombat?: No Did You Receive Any Psychiatric Treatment/Services While in Equities traderthe Military?: No Are There Guns or Education officer, communityther Weapons in Your Home?: No Are These Weapons Safely Secured?: No  Financial Resources:   Surveyor, quantityinancial resources: [employer], Income from spouse Does patient have a Lawyerrepresentative payee or guardian?: No  Alcohol/Substance Abuse:   What has been your use of drugs/alcohol within the last 12 months?: pt reports hx of alcohol abuse. "Now I don't drink everyday anymore. I went out with friends, got drunk, and cut my wrists when I got home." social drinker. pt denies other substance abuse.  If attempted suicide, did drugs/alcohol play a role in this?: Yes (pt reports that alcohol intoxication caused her to "act irrationially and cut my wrists.") Alcohol/Substance Abuse Treatment Hx: Denies past history, Past Tx, Outpatient If yes, describe treatment: [facility] for outpatient care-"I had my first appt the other week but missed it."  Has alcohol/substance abuse ever caused legal problems?: Yes (DUI last year. "I'm about to be done with that process and will hopefully get my license back")  Social Support System:   Patient's Community Support System: [employer] Museum/gallery exhibitions officerDescribe Community Support System: some close friends in community; supportive fiance and father Type of faith/religion: n/a  How does patient's faith help to cope with current illness?: n/a   Leisure/Recreation:   Leisure and Hobbies: spending time with my sons. "I've been isolating alot lately and staying in bed. lost interest in alot of things."   Strengths/Needs:   What things does the patient do well?: "I think I'm a good mother and nice  persion." In what areas does patient struggle / problems for patient: coping with grief (unresolved) and depression; "I need someone to talk to in addition to taking medications."  Discharge Plan:   Does patient have access to transportation?: Yes ("my fiance can take me to my appts") Will patient be returning to same living situation after discharge?: Yes (return home with family) Currently receiving community mental health services: No If no, would patient like referral for services when discharged?: Yes (What county?) Careers adviser([facility]) Does patient have financial barriers related to discharge medications?: No ([place])  Summary/Recommendations:   Summary and Recommendations (to be completed by the evaluator): Patient is 30 year old [place] [facility] with her fiance, 3yo son, and stepson. Patient presents to the hospital after cutting wrists while intoxicated. She reports increasing depression over the span of 3 years, since the death of her mother. Patient reports previous DUI last year and hx of alcohol abuse. Pt reports that she socially drinks currently and does not drink daily or require detox. Patient currently denies SI/HI/AVH. Recommenations for patient include: crisis stabilization, therapeutic milieu, encourage group attendance and participation, medication management for mood stabilization, and development of comprehensive mental wellness/sobriety plan. [facility] and attend AA for additional support.   Smart, Brizeida Mcmurry LCSW 03/24/2016 3:14 PM

## 2016-03-24 NOTE — [facility] Group Notes (Signed)
[facility] LCSW After[facility] Discharge Planning Group Note   03/24/2016 9:47 AM  Participation Quality:  Invited. DID NOT ATTEND.   Smart, Kortni Hasten LCSW

## 2016-03-24 NOTE — [facility] Suicide Risk Assessment (Signed)
[facility] INPATIENT:  Family/Significant Other Suicide Prevention Education  Suicide Prevention Education:  Education Completed; Jaquita FoldsDwight Newby II (pt's fiance) [phone] has been identified by the patient as the family member/significant other with whom the patient will be residing, and identified as the person(s) who will aid the patient in the event of a mental health crisis (suicidal ideations/suicide attempt).  With written consent from the patient, the family member/significant other has been provided the following suicide prevention education, prior to the and/or following the discharge of the patient.  The suicide prevention education provided includes the following:  Suicide risk factors  Suicide prevention and interventions  National Suicide Hotline telephone number  [facility] assessment telephone number  [facility] Emergency Assistance 911  [facility] and/or Residential Mobile Crisis Unit telephone number  Request made of family/significant other to:  Remove weapons (e.g., guns, rifles, knives), all items previously/currently identified as safety concern.    Remove drugs/medications (over-the-counter, prescriptions, illicit drugs), all items previously/currently identified as a safety concern.  The family member/significant other verbalizes understanding of the suicide prevention education information provided.  The family member/significant other agrees to remove the items of safety concern listed above.  Smart, Rowen Hur LCSW 03/24/2016, 3:39 PM

## 2016-03-24 NOTE — Progress Notes (Signed)
Kathryn Holland remains minimally interactive. She states her day was "ok". Denies SI/HI/AVH. Encouragement and support given. Medications administered as prescribed. Continue Q 15 minute checks for patient safety and medication effectiveness.

## 2016-03-24 NOTE — Progress Notes (Signed)
Pt attended AA meeting this evening.  

## 2016-03-24 NOTE — Progress Notes (Signed)
[facility] MD Progress Note  03/24/2016 2:28 PM Gracelee Stemmler  MRN:  [id number]   Subjective:  Patient reports " I regret my actions, but I know I have to get better for my son."  Objective: Sunaina Ferrando is awake, alert and oriented X4. Seen resting in her bedroom. Denies suicidal or homicidal ideation at this time. Denies auditory or visual hallucination and does not appear to be responding to internal stimuli. Patient reports interacting well with staff and others. Patient reports she is medication compliant and is tolerating medications well. Patient reports that the medication is making her sleepy. Reports she normally takes her medications at night. States her depression 5/10. Patient states "I am feeling a little better mostly tired." Patient reports missing family and friends.  Reports good appetite and states she is resting well. Support, encouragement and reassurance was provided.    Principal Problem: Severe recurrent major depression without psychotic features (HCC) Diagnosis:   Patient Active Problem List   Diagnosis Date Noted  . Severe recurrent major depression without psychotic features (HCC) [F33.2] 03/23/2016  . Alcohol use disorder, severe, dependence (HCC) [F10.20] 03/22/2016   Total Time spent with patient: 30 minutes  Past Psychiatric History: See Above  Past Medical History:  Past Medical History  Diagnosis Date  . Kidney stone   . Gallstones   . Ovarian cyst     Past Surgical History  Procedure Laterality Date  . Lithotripsy     Family History:  Family History  Problem Relation Age of Onset  . Cancer Mother   . Bipolar disorder Sister   . Anxiety disorder Brother   . ADD / ADHD Brother    Family Psychiatric  History: See Above Social History:  History  Alcohol Use  . Yes    Comment: socially     History  Drug Use No    Social History   Social History  . Marital Status: Single    Spouse Name: N/A  . Number of Children: N/A  . Years of  Education: N/A   Social History Main Topics  . Smoking status: Current Every Day Smoker -- 0.50 packs/day    Types: Cigarettes  . Smokeless tobacco: None  . Alcohol Use: Yes     Comment: socially  . Drug Use: No  . Sexual Activity: Not Asked   Other Topics Concern  . None   Social History Narrative   Additional Social History:                         Sleep: Fair  Appetite:  Fair  Current Medications: Current Facility-Administered Medications  Medication Dose Route Frequency Provider Last Rate Last Dose  . acetaminophen (TYLENOL) tablet 650 mg  650 mg Oral Q6H PRN Craige Cotta, MD   650 mg at 03/23/16 0857  . ARIPiprazole (ABILIFY) tablet 2 mg  2 mg Oral Daily Charm Rings, NP   2 mg at 03/24/16 0849  . bacitracin ointment   Topical BID Craige Cotta, MD      . hydrOXYzine (ATARAX/VISTARIL) tablet 25 mg  25 mg Oral Q6H PRN Earney Navy, NP   25 mg at 03/22/16 2213  . loperamide (IMODIUM) capsule 2-4 mg  2-4 mg Oral PRN Earney Navy, NP      . LORazepam (ATIVAN) tablet 1 mg  1 mg Oral Q6H PRN Earney Navy, NP      . multivitamin with minerals tablet 1 tablet  1 tablet Oral Daily Earney Navy, NP   1 tablet at 03/24/16 0849  . nicotine (NICODERM CQ - dosed in mg/24 hr) patch 7 mg  7 mg Transdermal Daily Craige Cotta, MD   7 mg at 03/24/16 0850  . nicotine polacrilex (NICORETTE) gum 2 mg  2 mg Oral PRN Craige Cotta, MD      . ondansetron (ZOFRAN-ODT) disintegrating tablet 4 mg  4 mg Oral Q6H PRN Earney Navy, NP      . thiamine (VITAMIN B-1) tablet 100 mg  100 mg Oral Daily Earney Navy, NP   100 mg at 03/24/16 0848  . venlafaxine XR (EFFEXOR-XR) 24 hr capsule 225 mg  225 mg Oral Q breakfast Jomarie Longs, MD   225 mg at 03/24/16 1610    Lab Results:  Results for orders placed or performed during the hospital encounter of 03/22/16 (from the past 48 hour(s))  TSH     Status: None   Collection Time: 03/24/16  6:30 AM   Result Value Ref Range   TSH 2.454 0.350 - 4.500 uIU/mL    Comment: Performed at [facility]  Lipid panel     Status: Abnormal   Collection Time: 03/24/16  6:30 AM  Result Value Ref Range   Cholesterol 215 (H) 0 - 200 mg/dL   Triglycerides 960 (H) <150 mg/dL   HDL 52 >45 mg/dL   Total CHOL/HDL Ratio 4.1 RATIO   VLDL 36 0 - 40 mg/dL   LDL Cholesterol 409 (H) 0 - 99 mg/dL    Comment:        Total Cholesterol/HDL:CHD Risk Coronary Heart Disease Risk Table                     Men   Women  1/2 Average Risk   3.4   3.3  Average Risk       5.0   4.4  2 X Average Risk   9.6   7.1  3 X Average Risk  23.4   11.0        Use the calculated Patient Ratio above and the CHD Risk Table to determine the patient's CHD Risk.        ATP III CLASSIFICATION (LDL):  <100     mg/dL   Optimal  811-914  mg/dL   Near or Above                    Optimal  130-159  mg/dL   Borderline  782-956  mg/dL   High  >213     mg/dL   Very High Performed at [facility]     Blood Alcohol level:  Lab Results  Component Value Date   [facility] 228* 03/22/2016    Physical Findings: AIMS: Facial and Oral Movements Muscles of Facial Expression: None, normal Lips and Perioral Area: None, normal Jaw: None, normal Tongue: None, normal,Extremity Movements Upper (arms, wrists, hands, fingers): None, normal Lower (legs, knees, ankles, toes): None, normal, Trunk Movements Neck, shoulders, hips: None, normal, Overall Severity Severity of abnormal movements (highest score from questions above): None, normal Incapacitation due to abnormal movements: None, normal Patient's awareness of abnormal movements (rate only patient's report): No Awareness, Dental Status Current problems with teeth and/or dentures?: No Does patient usually wear dentures?: No  CIWA:  CIWA-Ar Total: 8 COWS:     Musculoskeletal: Strength & Muscle Tone: within normal limits Gait & Station: normal Patient leans:  N/A  Psychiatric Specialty Exam: Physical Exam  Nursing note and vitals reviewed. Constitutional: She is oriented to person, place, and time. She appears well-developed.  Musculoskeletal: Normal range of motion.  Neurological: She is alert and oriented to person, place, and time.  Skin: Skin is warm and dry.  Psychiatric: She has a normal mood and affect. Her behavior is normal. Thought content normal.    Review of Systems  Musculoskeletal:       Right wrist: laceration with sutures  Psychiatric/Behavioral: Positive for depression. Negative for suicidal ideas and hallucinations. The patient has insomnia.   All other systems reviewed and are negative.   Blood pressure 118/82, pulse 78, temperature 98.5 F (36.9 C), temperature source Oral, resp. rate 16, height 5' 5.5" (1.664 m), weight 74.39 kg (164 lb).Body mass index is 26.87 kg/(m^2).  General Appearance: Casual  Eye Contact:  Good  Speech:  Clear and Coherent  Volume:  Normal  Mood:  Anxious and Depressed  Affect:  Congruent  Thought Process:  Coherent  Orientation:  Full (Time, Place, and Person)  Thought Content:  Hallucinations: None  Suicidal Thoughts:  No Denies at this time. Patient is able to contract for safety while on the unit.  Homicidal Thoughts:  No  Memory:  Immediate;   Fair Recent;   Fair Remote;   Fair  Judgement:  Fair  Insight:  Fair  Psychomotor Activity:  Normal  Concentration:  Concentration: Fair  Recall:  [employer]  Fund of Knowledge:  Fair  Language:  Fair  Akathisia:  No  Handed:  Right  AIMS (if indicated):     Assets:  Desire for Improvement Housing Resilience Social Support  ADL's:  Intact  Cognition:  WNL  Sleep:  Number of Hours: 6.25    I agree with current treatment plan on 05/26//2017, Patient seen face-to-face for psychiatric evaluation follow-up, chart reviewed. Reviewed the information documented and agree with the treatment plan.  Treatment Plan Summary: Daily contact with  patient to assess and evaluate symptoms and progress in treatment, Medication management and Plan major depressive disorder, recurrent, severe without psychosis:  Order Placed for  POCT-Pregnancy- Results pending -Crisis stabilization -Medications adjustments:  Continue Effexor 150 mg daily for depression/anxiety ( adjusted to PO QHS) Continue Abilify 2 mg daily for adjunctive depressive medication ( Adjusted to PO QHS) -Individual and group therapy -LOS 5-7 days -Substance abuse counseling  Oneta Rackanika N Armstead Heiland, NP 03/24/2016, 2:28 PM

## 2016-03-24 NOTE — Progress Notes (Signed)
DAR NOTE: Pt present with flat affect and depressed mood in the unit. Pt has been isolating herself and has been in bed most of the time. Pt denies physical pain, took all her meds as scheduled. As per self inventory, pt had a poor night sleep, good appetite, low energy, and good concentration. Pt rate depression at 3, hopeless ness at 5, and anxiety at 6. Pt's safety ensured with 15 minute and environmental checks. Pt currently denies SI/HI and A/V hallucinations. Pt verbally agrees to seek staff if SI/HI or A/VH occurs and to consult with staff before acting on these thoughts. Will continue POC.

## 2016-03-25 LAB — HEMOGLOBIN A1C
HEMOGLOBIN A1C: 5.3 % (ref 4.8–5.6)
Mean Plasma Glucose: 105 mg/dL

## 2016-03-25 MED ORDER — VENLAFAXINE HCL ER 75 MG PO CP24
225.0000 mg | ORAL_CAPSULE | Freq: Every day | ORAL | Status: DC
  Administered 2016-03-25: 225 mg via ORAL
  Filled 2016-03-25 (×2): qty 1

## 2016-03-25 MED ORDER — LORATADINE 10 MG PO TABS
10.0000 mg | ORAL_TABLET | Freq: Every day | ORAL | Status: DC
  Administered 2016-03-26 – 2016-03-27 (×2): 10 mg via ORAL
  Filled 2016-03-25 (×3): qty 1
  Filled 2016-03-25: qty 7
  Filled 2016-03-25 (×3): qty 1

## 2016-03-25 MED ORDER — VENLAFAXINE HCL ER 150 MG PO CP24
150.0000 mg | ORAL_CAPSULE | Freq: Every day | ORAL | Status: DC
  Administered 2016-03-26 – 2016-03-28 (×3): 150 mg via ORAL
  Filled 2016-03-25 (×3): qty 1
  Filled 2016-03-25: qty 7
  Filled 2016-03-25: qty 1

## 2016-03-25 NOTE — [facility] Group Notes (Signed)
[facility] Group Notes:  (Clinical Social Work)  @TODAY @    10:00-11:00AM  Summary of Progress/Problems:   The main focus of today's process group was to   1)  Discuss the Stages of Change using a handout;  2)  Have patients identify their current maladaptive behavior and which stage of change they are in;  3)  Use Motivational Interviewing to address how to move forward; and  4)  Introduce an Anger Evaluation Exercise.  An emphasis was placed on each person being an individual and having different reasons for their substance use, as well as being in different stages of change.  The patient expressed full comprehension of the stages of change, and participated fully in discussing their personal reasons for thinking about making changes in their maladaptive behavior(s).  The patient stated she was feeling sleepy because of medicine taken last night, and would like something for her ADHD symptoms.  She stated she has made a decision to stop drinking alcohol, feels she must or she will die.  She said she is in the Planning Stage of Change, and one of her plans is to add meetings to her life.  Type of Therapy:  Process Group with Motivational Interviewing  Participation Level:  Active  Participation Quality:  Attentive and Drowsy  Affect:  Flat  Cognitive:  Oriented  Insight:  Limited  Engagement in Therapy:  Engaged  Modes of Intervention:   Education, Support and Processing, Activity  Pilgrim's PrideMareida Grossman-Orr, LCSW @TODAY @ 12:57 PM

## 2016-03-25 NOTE — Progress Notes (Signed)
Patient did not attend the evening speaker AA meeting. Pt was notified that group was beginning but remained in bed.   

## 2016-03-25 NOTE — Progress Notes (Signed)
Assumed care of patient at 1330. Patient very flat in affect, mood depressed. Complaining of anxiety and requested prn vistaril which was given with some relief. Effexor dose also given and per her discussion with provider will be scheduled for daily at 0800. Patient verbalized understanding. Reports some relief from vistaril. Patient remains safe on level III obs.

## 2016-03-25 NOTE — Progress Notes (Signed)
[facility]  03/25/2016 3:55 PM Kathryn Holland  MRN:  [id number]   Subjective:  Patient reports "I feel like I'm really tired with the abilify and it was making me too tired to take it in the morning"   Objective: Kathryn Holland is seen and chart reviewed. Pt is alert/oriented x4, calm, cooperative, and appropriate to situation. Pt denies suicidal/homicidal ideation and psychosis and does not appear to be responding to internal stimuli. Pt reports feeling very somnolent on her medications in the morning and would like them moved.    Principal Problem: Severe recurrent major depression without psychotic features (HCC) Diagnosis:   Patient Active Problem List   Diagnosis Date Noted  . Severe recurrent major depression without psychotic features (HCC) [F33.2] 03/23/2016    Priority: High  . Alcohol use disorder, severe, dependence (HCC) [F10.20] 03/22/2016    Priority: High   Total Time spent with patient: 30 minutes  Past Psychiatric History: See Above  Past Medical History:  Past Medical History  Diagnosis Date  . Kidney stone   . Gallstones   . Ovarian cyst     Past Surgical History  Procedure Laterality Date  . Lithotripsy     Family History:  Family History  Problem Relation Age of Onset  . Cancer Mother   . Bipolar disorder Sister   . Anxiety disorder Brother   . ADD / ADHD Brother    Family Psychiatric  History: See Above Social History:  History  Alcohol Use  . Yes    Comment: socially     History  Drug Use No    Social History   Social History  . Marital Status: Single    Spouse Name: N/A  . Number of Children: N/A  . Years of Education: N/A   Social History Main Topics  . Smoking status: Current Every Day Smoker -- 0.50 packs/day    Types: Cigarettes  . Smokeless tobacco: None  . Alcohol Use: Yes     Comment: socially  . Drug Use: No  . Sexual Activity: Not Asked   Other Topics Concern  . None   Social History Narrative    Additional Social History:                         Sleep: Fair  Appetite:  Fair  Current Medications: Current Facility-Administered Medications  Medication Dose Route Frequency Provider Last Rate Last Dose  . acetaminophen (TYLENOL) tablet 650 mg  650 mg Oral Q6H PRN Craige CottaFernando A Tonnie Stillman, MD   650 mg at 03/23/16 0857  . ARIPiprazole (ABILIFY) tablet 2 mg  2 mg Oral QHS Oneta Rackanika N Lewis, NP      . bacitracin ointment   Topical BID Craige CottaFernando A Malin Sambrano, MD      . multivitamin with minerals tablet 1 tablet  1 tablet Oral Daily Earney NavyJosephine C Onuoha, NP   1 tablet at 03/25/16 0801  . nicotine (NICODERM CQ - dosed in mg/24 hr) patch 7 mg  7 mg Transdermal Daily Craige CottaFernando A Rondo Spittler, MD   7 mg at 03/25/16 0801  . thiamine (VITAMIN B-1) tablet 100 mg  100 mg Oral Daily Earney NavyJosephine C Onuoha, NP   100 mg at 03/25/16 0801  . venlafaxine XR (EFFEXOR-XR) 24 hr capsule 225 mg  225 mg Oral Q breakfast Craige CottaFernando A Makynli Stills, MD   225 mg at 03/25/16 1414    Lab Results:  Results for orders placed or performed during the hospital  encounter of 03/22/16 (from the past 48 hour(s))  TSH     Status: None   Collection Time: 03/24/16  6:30 AM  Result Value Ref Range   TSH 2.454 0.350 - 4.500 uIU/mL    Comment: Performed at [facility]  Lipid panel     Status: Abnormal   Collection Time: 03/24/16  6:30 AM  Result Value Ref Range   Cholesterol 215 (H) 0 - 200 mg/dL   Triglycerides 213 (H) <150 mg/dL   HDL 52 >08 mg/dL   Total CHOL/HDL Ratio 4.1 RATIO   VLDL 36 0 - 40 mg/dL   LDL Cholesterol 657 (H) 0 - 99 mg/dL    Comment:        Total Cholesterol/HDL:CHD Risk Coronary Heart Disease Risk Table                     Men   Women  1/2 Average Risk   3.4   3.3  Average Risk       5.0   4.4  2 X Average Risk   9.6   7.1  3 X Average Risk  23.4   11.0        Use the calculated Patient Ratio above and the CHD Risk Table to determine the patient's CHD Risk.        ATP III CLASSIFICATION (LDL):   <100     mg/dL   Optimal  846-962  mg/dL   Near or Above                    Optimal  130-159  mg/dL   Borderline  952-841  mg/dL   High  >324     mg/dL   Very High Performed at [facility]   Hemoglobin A1c     Status: None   Collection Time: 03/24/16  6:30 AM  Result Value Ref Range   Hgb A1c MFr Bld 5.3 4.8 - 5.6 %    Comment: (NOTE)         Pre-diabetes: 5.7 - 6.4         Diabetes: >6.4         Glycemic control for adults with diabetes: <7.0    Mean Plasma Glucose 105 mg/dL    Comment: (NOTE) Performed At: [facility] [address] [place], [place] [id number] Mila Homer MD [id number] Performed at [facility]     Blood Alcohol level:  Lab Results  Component Value Date   [facility] 228* 03/22/2016    Physical Findings: AIMS: Facial and Oral Movements Muscles of Facial Expression: None, normal Lips and Perioral Area: None, normal Jaw: None, normal Tongue: None, normal,Extremity Movements Upper (arms, wrists, hands, fingers): None, normal Lower (legs, knees, ankles, toes): None, normal, Trunk Movements Neck, shoulders, hips: None, normal, Overall Severity Severity of abnormal movements (highest score from questions above): None, normal Incapacitation due to abnormal movements: None, normal Patient's awareness of abnormal movements (rate only patient's report): No Awareness, Dental Status Current problems with teeth and/or dentures?: No Does patient usually wear dentures?: No  CIWA:  CIWA-Ar Total: 0 COWS:     Musculoskeletal: Strength & Muscle Tone: within normal limits Gait & Station: normal Patient leans: N/A  Psychiatric Specialty Exam: Physical Exam  Nursing note and vitals reviewed. Constitutional: She is oriented to person, place, and time. She appears well-developed.  Musculoskeletal: Normal range of motion.  Neurological: She is alert and oriented to person, place,  and time.  Skin: Skin is warm and dry.   Psychiatric: She has a normal mood and affect. Her behavior is normal. Thought content normal.    Review of Systems  Musculoskeletal:       Right wrist: laceration with sutures  Psychiatric/Behavioral: Positive for depression. Negative for suicidal ideas and hallucinations. The patient has insomnia.   All other systems reviewed and are negative.   Blood pressure 110/74, pulse 95, temperature 98.8 F (37.1 C), temperature source Oral, resp. rate 16, height 5' 5.5" (1.664 m), weight 74.39 kg (164 lb).Body mass index is 26.87 kg/(m^2).  General Appearance: Casual and Fairly Groomed  Eye Contact:  Good  Speech:  Clear and Coherent and Normal Rate  Volume:  Normal  Mood:  Anxious and Depressed  Affect:  Congruent  Thought Process:  Coherent  Orientation:  Full (Time, Place, and Person)  Thought Content:  Symptoms, worries, concerns  Suicidal Thoughts:  No Denies at this time. Patient is able to contract for safety while on the unit.   Homicidal Thoughts:  No   Memory:  Immediate;   Fair Recent;   Fair Remote;   Fair  Judgement:  Fair  Insight:  Fair  Psychomotor Activity:  Normal  Concentration:  Concentration: Fair  Recall:  [employer] of Knowledge:  Fair  Language:  Fair  Akathisia:  No  Handed:  Right  AIMS (if indicated):     Assets:  Desire for Improvement Housing Resilience Social Support  ADL's:  Intact  Cognition:  WNL  Sleep:  Number of Hours: 6.25   On 03/25/2016 I have reviewed and concur with treatment plan below, modified as follows:  Treatment Plan Summary: Daily contact with patient to assess and evaluate symptoms and progress in treatment, Medication management and Plan major depressive disorder, recurrent, severe without psychosis:  Order Placed for  POCT-Pregnancy-Negative -Crisis stabilization -Medications adjustments:  Continue Effexor 150 mg daily for depression/anxiety ( adjusted to AM for wakefulness Continue Abilify 2 mg daily for adjunctive  depressive medication ( Adjusted to PO QHS to sleep better) -Individual and group therapy -Add claritin for allergies -LOS 5-7 days -Substance abuse counseling  Beau Fanny, FNP 03/25/2016, 3:55 PM Agree with NP Progress Note as above

## 2016-03-25 NOTE — Progress Notes (Signed)
D.  Pt pleasant but anxious on approach, did not feel well enough to attend evening AA group.  Pt had minimal interaction on unit and stayed on phone most of time after getting up for snacks.  Pt denies SI/HI/AVH at this time.  A.  Support and encouragement offered, medication given as ordered.  R.   Pt remains safe on the unit, will continue to monitor.

## 2016-03-25 NOTE — [facility] Group Notes (Signed)
[facility] Group Notes:  (Nursing/MHT/Case Management/Adjunct)  Date:  03/25/2016  Time:  9:19 AM  Type of Therapy:  Psychoeducational Skills  Participation Level:  Active  Participation Quality:  Appropriate and Attentive  Affect:  Appropriate  Cognitive:  Alert and Appropriate  Insight:  Appropriate  Engagement in Group:  Supportive  Modes of Intervention:  Support  Summary of Progress/Problems: Kathryn Holland's goal to get "my medication set right."  She is interested in getting a non-narcotic ADHD medication.    Kathryn Holland, Kathryn Holland 03/25/2016, 9:19 AM

## 2016-03-25 NOTE — Progress Notes (Signed)
D: Patient states her depressive symptoms have decreased.  She is concerned with getting her medications stable.  She rates her depression as 2; hopelessness as a 0; anxiety as 3-4.  Her goal is to "find a good schedule to take my meds."  She denies SI/HI/AVH.  Patient interactions well with her peers and staff.  She is attending groups. A: Continue to monitor medication management and MD orders.  Safety checks every 15 minutes per protocol.  Offer support and encouragement as needed. R: Patient is receptive to staff; her behavior is appropriate.

## 2016-03-26 MED ORDER — HYDROXYZINE HCL 25 MG PO TABS
25.0000 mg | ORAL_TABLET | Freq: Four times a day (QID) | ORAL | Status: DC | PRN
  Administered 2016-03-26 – 2016-03-27 (×3): 25 mg via ORAL
  Filled 2016-03-26 (×2): qty 1
  Filled 2016-03-26: qty 10
  Filled 2016-03-26: qty 1

## 2016-03-26 MED ORDER — TRAZODONE HCL 50 MG PO TABS
50.0000 mg | ORAL_TABLET | Freq: Every evening | ORAL | Status: DC | PRN
  Administered 2016-03-26: 50 mg via ORAL
  Filled 2016-03-26 (×2): qty 1

## 2016-03-26 NOTE — [facility] Group Notes (Signed)
[facility] Group Notes:  (Nursing/MHT/Case Management/Adjunct)  Date:  03/26/2016  Time:  2:14 PM   Type of Therapy:  Psychoeducational Skills  Participation Level:  Active  Participation Quality:  Appropriate  Affect:  Appropriate  Cognitive:  Appropriate  Insight:  Appropriate  Engagement in Group:  Engaged  Modes of Intervention:  Problem-solving  Summary of Progress/Problems: Topic was on leisure and lifestyle changes. Discussed the important of choosing healthy leisure activities. Group encouraged to surround themselves with positive and healthy group/support system when changing to a healthy life style.   Bethann PunchesJane O Shavona Gunderman 03/26/2016, 2:14 PM

## 2016-03-26 NOTE — [facility] Group Notes (Signed)
[facility] Group Notes: (Clinical Social Work)   03/26/2016      Type of Therapy:  Group Therapy   Participation Level:  Did Not Attend despite MHT prompting   Ambrose MantleMareida Grossman-Orr, LCSW 03/26/2016, 11:39 AM

## 2016-03-26 NOTE — Progress Notes (Signed)
D: Pt continues to be very flat and depressed on the unit today. Pt attended group, not very interactive and keep to herself most of the time.  Pt reported that her depression was a 02, her hopelessness was a 0, and that her anxiety was a 03. Pt provided with medications per providers orders. Pt's labs and vitals were monitored throughout the day. Pt supported emotionally and encouraged to express concerns and questions. Pt educated on medications. Pt reported being negative SI/HI, no AH/VH noted. A: 15 min checks continued for patient safety. R: Pts safety maintained.

## 2016-03-26 NOTE — Progress Notes (Signed)
D.  Pt pleasant but anxious on approach.  Complaint of insomnia last night.  Pt felt that perhaps her Abilify kept her awake last night but since she did not have Trazodone ordered then she decided to go ahead and take it along with the Trazodone and see if this helps.  Pt did attend evening AA group, observed engaging in appropriate interaction on unit.  Denies SI/HI/hallucinaitons at this time.  A.  Support and encouragement offered, medications given as ordered  R.  Pt remains safe on the unit, will continue to monitor.

## 2016-03-26 NOTE — Progress Notes (Signed)
Patient did attend the evening speaker AA meeting.  

## 2016-03-27 NOTE — [facility] Group Notes (Signed)
[facility]   03/27/2016 9:26 AM  Participation Quality:  Invited. DID NOT ATTEND. Pt chose to rest in room.   Smart, Crystal Ellwood LCSW

## 2016-03-27 NOTE — Progress Notes (Signed)
[facility]  Kathryn Holland  MRN:  [id number]   Subjective:  Patient reports "I am still tired today but I guess it's improving overall.   Objective: Kathryn PlaneSarah Sopp is seen and chart reviewed. Pt is alert/oriented x4, calm, cooperative, and appropriate to situation. Pt denies suicidal/homicidal ideation and psychosis and does not appear to be responding to internal stimuli. Pt states that she is still very tired with some improvement. She has some mild rumination about fears of Hep B/C.    Principal Problem: Severe recurrent major depression without psychotic features (HCC) Diagnosis:   Patient Active Problem List   Diagnosis Date Noted  . Severe recurrent major depression without psychotic features (HCC) [F33.2] 03/23/2016    Priority: High  . Alcohol use disorder, severe, dependence (HCC) [F10.20] 03/22/2016    Priority: High   Total Time spent with patient: 25 minutes  Past Psychiatric History: See Above  Past Medical History:  Past Medical History  Diagnosis Date  . Kidney stone   . Gallstones   . Ovarian cyst     Past Surgical History  Procedure Laterality Date  . Lithotripsy     Family History:  Family History  Problem Relation Age of Onset  . Cancer Mother   . Bipolar disorder Sister   . Anxiety disorder Brother   . ADD / ADHD Brother    Family Psychiatric  History: See Above Social History:  History  Alcohol Use  . Yes    Comment: socially     History  Drug Use No    Social History   Social History  . Marital Status: Single    Spouse Name: N/A  . Number of Children: N/A  . Years of Education: N/A   Social History Main Topics  . Smoking status: Current Every Day Smoker -- 0.50 packs/day    Types: Cigarettes  . Smokeless tobacco: None  . Alcohol Use: Yes     Comment: socially  . Drug Use: No  . Sexual Activity: Not Asked   Other Topics Concern  . None   Social History Narrative   Additional Social History:                          Sleep: Fair  Appetite:  Fair  Current Medications: Current Facility-Administered Medications  Medication Dose Route Frequency Provider Last Rate Last Dose  . acetaminophen (TYLENOL) tablet 650 mg  650 mg Oral Q6H PRN Craige CottaFernando A Cobos, MD   650 mg at 03/23/16 0857  . ARIPiprazole (ABILIFY) tablet 2 mg  2 mg Oral QHS Oneta Rackanika N Lewis, NP   2 mg at 03/27/16 2140  . bacitracin ointment   Topical BID Craige CottaFernando A Cobos, MD      . hydrOXYzine (ATARAX/VISTARIL) tablet 25 mg  25 mg Oral Q6H PRN Craige CottaFernando A Cobos, MD   25 mg at 03/27/16 2006  . loratadine (CLARITIN) tablet 10 mg  10 mg Oral Daily Beau FannyJohn C Withrow, FNP   10 mg at 03/27/16 [id number]  . multivitamin with minerals tablet 1 tablet  1 tablet Oral Daily Earney NavyJosephine C Onuoha, NP   1 tablet at 03/27/16 0828  . nicotine (NICODERM CQ - dosed in mg/24 hr) patch 7 mg  7 mg Transdermal Daily Craige CottaFernando A Cobos, MD   7 mg at 03/27/16 0828  . thiamine (VITAMIN B-1) tablet 100 mg  100 mg Oral Daily Earney NavyJosephine C Onuoha, NP   100 mg at 03/27/16 0828  .  traZODone (DESYREL) tablet 50 mg  50 mg Oral QHS PRN,MR X 1 Court Joy, PA-C   50 mg at 03/26/16 2109  . venlafaxine XR (EFFEXOR-XR) 24 hr capsule 150 mg  150 mg Oral Q breakfast Beau Fanny, FNP   150 mg at 03/27/16 1610    Lab Results:  No results found for this or any previous visit (from the past 48 hour(s)).  Blood Alcohol level:  Lab Results  Component Value Date   [facility] 228* 03/22/2016    Physical Findings: AIMS: Facial and Oral Movements Muscles of Facial Expression: None, normal Lips and Perioral Area: None, normal Jaw: None, normal Tongue: None, normal,Extremity Movements Upper (arms, wrists, hands, fingers): None, normal Lower (legs, knees, ankles, toes): None, normal, Trunk Movements Neck, shoulders, hips: None, normal, Overall Severity Severity of abnormal movements (highest score from questions above): None, normal Incapacitation due to abnormal movements: None,  normal Patient's awareness of abnormal movements (rate only patient's report): No Awareness, Dental Status Current problems with teeth and/or dentures?: No Does patient usually wear dentures?: No  CIWA:  CIWA-Ar Total: 0 COWS:     Musculoskeletal: Strength & Muscle Tone: within normal limits Gait & Station: normal Patient leans: N/A  Psychiatric Specialty Exam: Physical Exam  Nursing note and vitals reviewed. Constitutional: She is oriented to person, place, and time. She appears well-developed.  Musculoskeletal: Normal range of motion.  Neurological: She is alert and oriented to person, place, and time.  Skin: Skin is warm and dry.  Psychiatric: She has a normal mood and affect. Her behavior is normal. Thought content normal.    Review of Systems  Musculoskeletal:       Right wrist: laceration with sutures  Psychiatric/Behavioral: Positive for depression. Negative for suicidal ideas and hallucinations. The patient has insomnia.   All other systems reviewed and are negative.   Blood pressure 113/76, pulse 85, temperature 98 F (36.7 C), temperature source Oral, resp. rate 16, height 5' 5.5" (1.664 m), weight 74.39 kg (164 lb).Body mass index is 26.87 kg/(m^2).  General Appearance: Casual and Fairly Groomed  Eye Contact:  Good  Speech:  Clear and Coherent and Normal Rate  Volume:  Normal  Mood:  Anxious and Depressed yet improving  Affect:  Congruent and depressed  Thought Process:  Coherent  Orientation:  Full (Time, Place, and Person)  Thought Content:  Symptoms, worries, concerns, pt is still somewhat somnolent  Suicidal Thoughts:  No able to contract for safety  Homicidal Thoughts:  No   Memory:  Immediate;   Fair Recent;   Fair Remote;   Fair  Judgement:  Fair  Insight:  Fair  Psychomotor Activity:  Normal  Concentration:  Concentration: Good and Attention Span: Good  Recall:  [employer] of Knowledge:  Fair  Language:  Fair  Akathisia:  No  Handed:  Right  AIMS  (if indicated):     Assets:  Desire for Improvement Housing Resilience Social Support  ADL's:  Intact  Cognition:  WNL  Sleep:  Number of Hours: 5   On 03/27/16 I have reviewed and concur with treatment plan below, modified as follows:  Treatment Plan Summary: Daily contact with patient to assess and evaluate symptoms and progress in treatment, Medication management and Plan major depressive disorder, recurrent, severe without psychosis:  Order Placed for  POCT-Pregnancy-Negative -Crisis stabilization -Medications adjustments:  Continue Effexor 150 mg daily for depression/anxiety ( adjusted to AM for wakefulness Continue Abilify 2 mg daily for adjunctive depressive medication (  Adjusted to PO QHS to sleep better) -Checking for Hep B/C panel per pt request -Individual and group therapy -Continue Add claritin for allergies -LOS 5-7 days -Substance abuse counseling  Beau Fanny, FNP 03/27/2016 11:04 PM  Agree with NP Progress Note as above

## 2016-03-27 NOTE — Progress Notes (Signed)
Recreation Therapy Notes  Date: 05.29.2017 Time: 9:30am Location: [address] Dayroom   Group Topic: Stress Management  Goal Area(s) Addresses:  Patient will actively participate in stress management techniques presented during session.   Behavioral Response: Did not attend.   Mousa Prout L Yemaya Barnier, LRT/CTRS        Aleksa Collinsworth L 03/27/2016 2:33 PM 

## 2016-03-27 NOTE — Progress Notes (Signed)
D: Pt continues to be very flat and depressed on the unit today. Pt also  Isolative and not interacting much. . Pt reported that his depression was a 0, his hopelessness was a 0, and that his anxiety was a 02. Her goal " getting blood work done that the doctor requested." Pt provided with medications per providers orders. Pt's labs and vitals were monitored throughout the day. Pt supported emotionally and encouraged to express concerns and questions. Pt educated on medications. Pt reported being negative SI/HI, no AH/VH noted. A: 15 min checks continued for patient safety. R: Pts safety maintained.

## 2016-03-27 NOTE — Progress Notes (Signed)
[facility]  Kathryn PlaneSarah Holland  MRN:  [id number]   Subjective:  Patient reports "I was feeling really sleepy. I have a little bit more energy today but not by much."  Objective: Kathryn PlaneSarah Holland is seen and chart reviewed. Pt is alert/oriented x4, calm, cooperative, and appropriate to situation. Pt denies suicidal/homicidal ideation and psychosis and does not appear to be responding to internal stimuli. Pt reports a minor improvement in her energy level today.   Principal Problem: Severe recurrent major depression without psychotic features (HCC) Diagnosis:   Patient Active Problem List   Diagnosis Date Noted  . Severe recurrent major depression without psychotic features (HCC) [F33.2] 03/23/2016    Priority: High  . Alcohol use disorder, severe, dependence (HCC) [F10.20] 03/22/2016    Priority: High   Total Time spent with patient: 25 minutes  Past Psychiatric History: See Above  Past Medical History:  Past Medical History  Diagnosis Date  . Kidney stone   . Gallstones   . Ovarian cyst     Past Surgical History  Procedure Laterality Date  . Lithotripsy     Family History:  Family History  Problem Relation Age of Onset  . Cancer Mother   . Bipolar disorder Sister   . Anxiety disorder Brother   . ADD / ADHD Brother    Family Psychiatric  History: See Above Social History:  History  Alcohol Use  . Yes    Comment: socially     History  Drug Use No    Social History   Social History  . Marital Status: Single    Spouse Name: N/A  . Number of Children: N/A  . Years of Education: N/A   Social History Main Topics  . Smoking status: Current Every Day Smoker -- 0.50 packs/day    Types: Cigarettes  . Smokeless tobacco: None  . Alcohol Use: Yes     Comment: socially  . Drug Use: No  . Sexual Activity: Not Asked   Other Topics Concern  . None   Social History Narrative   Additional Social History:                         Sleep:  Fair  Appetite:  Fair  Current Medications: Current Facility-Administered Medications  Medication Dose Route Frequency Provider Last Rate Last Dose  . acetaminophen (TYLENOL) tablet 650 mg  650 mg Oral Q6H PRN Craige CottaFernando A Cobos, MD   650 mg at 03/23/16 0857  . ARIPiprazole (ABILIFY) tablet 2 mg  2 mg Oral QHS Oneta Rackanika N Lewis, NP   2 mg at 03/27/16 2140  . bacitracin ointment   Topical BID Craige CottaFernando A Cobos, MD      . hydrOXYzine (ATARAX/VISTARIL) tablet 25 mg  25 mg Oral Q6H PRN Craige CottaFernando A Cobos, MD   25 mg at 03/27/16 2006  . loratadine (CLARITIN) tablet 10 mg  10 mg Oral Daily Beau FannyJohn C Withrow, FNP   10 mg at 03/27/16 [id number]  . multivitamin with minerals tablet 1 tablet  1 tablet Oral Daily Earney NavyJosephine C Onuoha, NP   1 tablet at 03/27/16 0828  . nicotine (NICODERM CQ - dosed in mg/24 hr) patch 7 mg  7 mg Transdermal Daily Craige CottaFernando A Cobos, MD   7 mg at 03/27/16 0828  . thiamine (VITAMIN B-1) tablet 100 mg  100 mg Oral Daily Earney NavyJosephine C Onuoha, NP   100 mg at 03/27/16 0828  . traZODone (DESYREL) tablet 50 mg  50 mg Oral QHS PRN,MR X 1 Court Joy, PA-C   50 mg at 03/26/16 2109  . venlafaxine XR (EFFEXOR-XR) 24 hr capsule 150 mg  150 mg Oral Q breakfast Beau Fanny, FNP   150 mg at 03/27/16 7829    Lab Results:  No results found for this or any previous visit (from the past 48 hour(s)).  Blood Alcohol level:  Lab Results  Component Value Date   [facility] 228* 03/22/2016    Physical Findings: AIMS: Facial and Oral Movements Muscles of Facial Expression: None, normal Lips and Perioral Area: None, normal Jaw: None, normal Tongue: None, normal,Extremity Movements Upper (arms, wrists, hands, fingers): None, normal Lower (legs, knees, ankles, toes): None, normal, Trunk Movements Neck, shoulders, hips: None, normal, Overall Severity Severity of abnormal movements (highest score from questions above): None, normal Incapacitation due to abnormal movements: None, normal Patient's awareness of  abnormal movements (rate only patient's report): No Awareness, Dental Status Current problems with teeth and/or dentures?: No Does patient usually wear dentures?: No  CIWA:  CIWA-Ar Total: 0 COWS:     Musculoskeletal: Strength & Muscle Tone: within normal limits Gait & Station: normal Patient leans: N/A  Psychiatric Specialty Exam: Physical Exam  Nursing note and vitals reviewed. Constitutional: She is oriented to person, place, and time. She appears well-developed.  Musculoskeletal: Normal range of motion.  Neurological: She is alert and oriented to person, place, and time.  Skin: Skin is warm and dry.  Psychiatric: She has a normal mood and affect. Her behavior is normal. Thought content normal.    Review of Systems  Musculoskeletal:       Right wrist: laceration with sutures  Psychiatric/Behavioral: Positive for depression. Negative for suicidal ideas and hallucinations. The patient has insomnia.   All other systems reviewed and are negative.   Blood pressure 113/76, pulse 85, temperature 98 F (36.7 C), temperature source Oral, resp. rate 16, height 5' 5.5" (1.664 m), weight 74.39 kg (164 lb).Body mass index is 26.87 kg/(m^2).  General Appearance: Casual and Fairly Groomed  Eye Contact:  Good  Speech:  Clear and Coherent and Normal Rate  Volume:  Normal  Mood:  Anxious and Depressed yet improving  Affect:  Congruent and depressed  Thought Process:  Coherent  Orientation:  Full (Time, Place, and Person)  Thought Content:  Symptoms, worries, concerns  Suicidal Thoughts:  No able to contract for safety  Homicidal Thoughts:  No   Memory:  Immediate;   Fair Recent;   Fair Remote;   Fair  Judgement:  Fair  Insight:  Fair  Psychomotor Activity:  Normal  Concentration:  Concentration: Good and Attention Span: Good  Recall:  [employer] of Knowledge:  Fair  Language:  Fair  Akathisia:  No  Handed:  Right  AIMS (if indicated):     Assets:  Desire for  Improvement Housing Resilience Social Support  ADL's:  Intact  Cognition:  WNL  Sleep:  Number of Hours: 5   On 03/26/16 I have reviewed and concur with treatment plan below, modified as follows:  Treatment Plan Summary: Daily contact with patient to assess and evaluate symptoms and progress in treatment, Medication management and Plan major depressive disorder, recurrent, severe without psychosis:  Order Placed for  POCT-Pregnancy-Negative -Crisis stabilization -Medications adjustments:  Continue Effexor 150 mg daily for depression/anxiety ( adjusted to AM for wakefulness Continue Abilify 2 mg daily for adjunctive depressive medication ( Adjusted to PO QHS to sleep better) -Individual and group  therapy -Continue Add claritin for allergies -LOS 5-7 days -Substance abuse counseling  Beau Fanny, FNP 03/26/2016, 2:35PM Agree with NP Progress Note as above

## 2016-03-27 NOTE — Plan of Care (Signed)
Problem: Activity: Goal: Interest or engagement in leisure activities will improve Outcome: Progressing Pt attend groups, but still feeling depressed

## 2016-03-28 MED ORDER — BACITRACIN ZINC 500 UNIT/GM EX OINT: TOPICAL_OINTMENT | Freq: Two times a day (BID) | CUTANEOUS | Status: DC

## 2016-03-28 MED ORDER — DOXEPIN HCL 25 MG PO CAPS
25.0000 mg | ORAL_CAPSULE | Freq: Every evening | ORAL | Status: DC | PRN
Start: 2016-03-28 — End: 2016-03-29
  Administered 2016-03-28: 25 mg via ORAL
  Filled 2016-03-28: qty 1
  Filled 2016-03-28: qty 14
  Filled 2016-03-28 (×2): qty 1
  Filled 2016-03-28: qty 14
  Filled 2016-03-28: qty 1

## 2016-03-28 MED ORDER — DOXEPIN HCL 25 MG PO CAPS: 25.0000 mg | ORAL_CAPSULE | Freq: Every evening | ORAL | Status: DC | PRN

## 2016-03-28 MED ORDER — NICOTINE 7 MG/24HR TD PT24: 7.0000 mg | MEDICATED_PATCH | Freq: Every day | TRANSDERMAL | Status: DC

## 2016-03-28 MED ORDER — HYDROXYZINE HCL 25 MG PO TABS: 25.0000 mg | ORAL_TABLET | Freq: Four times a day (QID) | ORAL | Status: DC | PRN

## 2016-03-28 MED ORDER — VENLAFAXINE HCL ER 150 MG PO CP24: 150.0000 mg | ORAL_CAPSULE | Freq: Every day | ORAL | Status: DC

## 2016-03-28 MED ORDER — LORATADINE 10 MG PO TABS: 10.0000 mg | ORAL_TABLET | Freq: Every day | ORAL | Status: DC

## 2016-03-28 MED ORDER — ARIPIPRAZOLE 2 MG PO TABS: 2.0000 mg | ORAL_TABLET | Freq: Every day | ORAL | Status: DC

## 2016-03-28 NOTE — Plan of Care (Signed)
Problem: Health Behavior/Discharge Planning: Goal: Compliance with therapeutic regimen will improve Outcome: Progressing Patient is able to verbalize her discharge plan.  She does not feel ready for discharge today.

## 2016-03-28 NOTE — Tx Team (Signed)
Interdisciplinary Treatment Plan Update (Adult)  Date:  03/28/2016  Time Reviewed:  10:30 AM   Progress in Treatment: Attending groups: Intermittently  Participating in groups:: Minimally, when she attends.  Taking medication as prescribed:  Yes. Tolerating medication:  Yes. Family/Significant othe contact made:  SPE completed with pt's fiance.  Patient understands diagnosis:  Yes. and As evidenced by:  seeking treatment SI, depression/grief over death of her mother recently, alcohol abuse, and medication management.  Discussing patient identified problems/goals with staff:  Yes. Medical problems stabilized or resolved:  Yes. Denies suicidal/homicidal ideation: Yes. Issues/concerns per patient self-inventory:  Other:  Discharge Plan or Barriers: Pt plans to return home with her fiance and has follow-up appt in [facility]. Pt provided with [place] information.   Reason for Continuation of Hospitalization: none  Comments:  Kathryn Holland is an 30 y.o. female. Pt was transported by EMS to [facility] because of a SI attempt. Pt cut her wrists last night. Pt denies HI. Pt denies AVH. Pt states she has been experiencing increased depression since her mother passed away. Pt reports drinking a 12 pack of beer last night. Pt's alcohol level was 228. Pt reports receiving outpatient treatment from [facility] for depression and alcoholism. Pt is prescribed Effexor. Pt denies taking her prescribed medication. Pt denies previous hospitalizations. Pt denies abuse.  F33.2 MDD, recurrent, severe  Estimated length of stay: d/c today  Additional Comments:  Patient and CSW reviewed pt's identified goals and treatment plan. Patient verbalized understanding and agreed to treatment plan. CSW reviewed [facility] "Discharge Process and Patient Involvement" Form. Pt verbalized understanding of information provided and signed form.    Review of initial/current patient goals per problem list:  1.  Goal(s): Patient will participate in aftercare plan  Met: Yes   Target date: at discharge  As evidenced by: Patient will participate within aftercare plan AEB aftercare provider and housing plan at discharge being identified.  5/25: CSW assessing. Pt current with [facility] for outpatient mental health services.   5/30: Pt plans to return home; follow-up at [facility].   2. Goal (s): Patient will exhibit decreased depressive symptoms and suicidal ideations.  Met: yes.    Target date: at discharge  As evidenced by: Patient will utilize self rating of depression at 3 or below and demonstrate decreased signs of depression or be deemed stable for discharge by MD.  5/25: Pt rates depression as high but denies SI/HI/AVH today.   5/30: Pt rates depression as 3/10 and presents with pleasant mood/calm affect.   3. Goal(s): Patient will demonstrate decreased signs of withdrawal due to substance abuse  Met: Yes.   Target date:at discharge   As evidenced by: Patient will produce a CIWA/COWS score of 0, have stable vitals signs, and no symptoms of withdrawal.  5/25: Pt reports mild withdrawals with CIWA of 3 and stable vitals.   5/30: Pt reports no signs of withdrawal with CIWA of 0 and stable vitals.  Attendees: Patient:   03/28/2016 10:30 AM   Family:   03/28/2016 10:30 AM   Physician:  Dr. Shea Evans MD 03/28/2016 10:30 AM   Nursing:   Chrys Racer RN  03/28/2016 10:30 AM   Clinical Social Worker: Maxie Better, LCSW 03/28/2016 10:30 AM   Clinical Social Worker: Erasmo Downer Drinkard LCSW; Peri Maris LCSWA 03/28/2016 10:30 AM   Other:  Gerline Legacy Nurse Case Manager 03/28/2016 10:30 AM   Other:  Agustina Caroli NP  03/28/2016 10:30 AM   Other:   03/28/2016 10:30  AM   Other:  03/28/2016 10:30 AM   Other:  03/28/2016 10:30 AM   Other:  03/28/2016 10:30 AM    03/28/2016 10:30 AM    03/28/2016 10:30 AM    03/28/2016 10:30 AM    03/28/2016 10:30 AM    Scribe for Treatment Team:   Maxie Better,  LCSW 03/28/2016 10:30 AM

## 2016-03-28 NOTE — Progress Notes (Signed)
  [facility] Dis[facility] :  Will you be returning to the same living situation after discharge:  Yes,  home with fiance At discharge, do you have transportation home?: Yes,  fiance Do you have the ability to pay for your medications: Yes,  [place] Medicaid  Release of information consent forms completed and submitted to medical records by CSW.  Patient to Follow up at: Follow-up Information    Follow up with [facility] On 03/30/2016.   Why:  Appt on this date at 1:00PM. Please bring photo ID, Medicaid card with you to this appt. Thank you.    Contact information:   [address] [place], [place] [address] Phone: [phone] Fax: [phone]      Next level of care provider has access to [facility] Link:no  Safety Planning and Suicide Prevention discussed: Yes,  SPE completed with pt's fiance. SPI pamphlet and Mobile Crisis information provided to pt and she was encouraged to share information with support network.   Have you used any form of tobacco in the last 30 days? (Cigarettes, Smokeless Tobacco, Cigars, and/or Pipes): No  Has patient been referred to the Quitline?: N/A patient is not a smoker  Patient has been referred for addiction treatment: Yes  Smart, Maresa Morash LCSW 03/28/2016, 10:29 AM

## 2016-03-28 NOTE — Discharge Summary (Signed)
Physician Discharge Summary Note  Patient:  Kathryn Holland is an 30 y.o., female MRN:  [id number] DOB:  12-Mar-1986 Patient phone:  [phone] (home)  Patient address:   [address] [place] [address],  Total Time spent with patient: Greater than 30 minutes  Date of Admission:  03/22/2016  Date of Discharge: 03-28-16  Reason for Admission: Worsening symptoms of depression  Principal Problem: Severe recurrent major depression without psychotic features [facility])  Discharge Diagnoses: Patient Active Problem List   Diagnosis Date Noted  . Severe recurrent major depression without psychotic features (HCC) [F33.2] 03/23/2016  . Alcohol use disorder, severe, dependence (HCC) [F10.20] 03/22/2016   Past Psychiatric History: Alcoholism, chronic  Past Medical History:  Past Medical History  Diagnosis Date  . Kidney stone   . Gallstones   . Ovarian cyst     Past Surgical History  Procedure Laterality Date  . Lithotripsy     Family History:  Family History  Problem Relation Age of Onset  . Cancer Mother   . Bipolar disorder Sister   . Anxiety disorder Brother   . ADD / ADHD Brother    Family Psychiatric  History: See H&P  Social History:  History  Alcohol Use  . Yes    Comment: socially     History  Drug Use No    Social History   Social History  . Marital Status: Single    Spouse Name: N/A  . Number of Children: N/A  . Years of Education: N/A   Social History Main Topics  . Smoking status: Current Every Day Smoker -- 0.50 packs/day    Types: Cigarettes  . Smokeless tobacco: None  . Alcohol Use: Yes     Comment: socially  . Drug Use: No  . Sexual Activity: Not Asked   Other Topics Concern  . None   Social History Narrative   Hospital Course: 30 yo female who presented to the ED after drinking alcohol and cutting her wrist in a suicide attempt, history of depression and alcohol dependence. Her depression has been increasing since her mother died  two years ago, especially the last six months since she had stopped drinking alcohol to cope and having her ADHD discontinued. Last night, her depression increased after a verbal altercation with her fiance and she started drinking. She drank a 12-pack of beer and went into a deep depression with suicidal ideations, cut her left wrist which required stitches. Today, she continues to have feelings of worthlessness, hopelessness, fatigue, self-isolating, and "tired of being depressed." Tearful on assessment. Reports her ADHD was discontinued by [place], her outpatient provider, six months ago and she has lost her ability to focus along with motivation. She has been isolating and not doing anything around the house. When her fiance returns from work, he gets frustrated with her lack of initiative and calls her lazy with needing to get help. Girl has been offered a job at [employer] in [place] with Daycare availability across the street for her 35 yo son. The only time she smiles is when discussing her son. He was with her sister last night when the above incidences occurred. Motivated to get help for her depression and feel better. Reports her Effexor helps her anxiety but not depression.  Upon her arrival & admision to the adult unit, Tava was evaluated & her presenting symptoms identified. Her lab results showed a blood alcohol level of 288 per toxicology test result. However, she was not presenting with any  substance withdrawal symptoms. She did not receive any detoxification treatments as result. She was admitted to the hospital for suicidal ideation & attempt by self mutilation. She cut her wrists & was drinking heavily at the time. She received mood stabilization treatments. Findley was  enrolled & encouraged to participate in the unit group programming, she did & learned coping skills. She presented no other significant health issues that required treatment or monitoring other than the wound  from her self inflicted lacerations that required wound care.   During the course of her hospitalization, Parminder was evaluated on daily basis by the clinical providers to assure her response to her treatment regimen.And as her treatment progressed, improvement was noted as evidenced by her report of decreasing symptoms, improved mood, medication tolerance & active participation in the unit programming. She was encouraged to update her providers on her progress by daily completion of a self inventory assessment, noting mood, mental status, any new symptoms, anxiety and or concerns.  Le's symptoms responded well to her treatment regimen combined with a therapeutic and supportive environment. She was motivated for recovery as evidenced by her positive/appropriate behavior & interaction with the staff & fellow patients.She also worked closely with the treatment team & case manager to develop a discharge plan with appropriate goals to maintain mood stability after discharge.   Upon her hospital discharge, Faydra was in much improved condition than upon admission.Her symptoms were reported as significantly decreased or resolved completely. She adamantly denies any SIHI,  AVH, delusional thoughts & or paranoia. She was motivated to continue taking medication with a goal of continued improvement in mental health.  Idell will continue psychiatric care on an outpatient basis as noted below. She is provided with all the necessary information required to make this appointment without problems. She received a 7 days worth, supply samples of her [facility] discharge medications. She left [facility] with all personal belongings in no apparent distress. Transportation per fiance.   Physical Findings: AIMS: Facial and Oral Movements Muscles of Facial Expression: None, normal Lips and Perioral Area: None, normal Jaw: None, normal Tongue: None, normal,Extremity Movements Upper (arms, wrists, hands, fingers): None,  normal Lower (legs, knees, ankles, toes): None, normal, Trunk Movements Neck, shoulders, hips: None, normal, Overall Severity Severity of abnormal movements (highest score from questions above): None, normal Incapacitation due to abnormal movements: None, normal Patient's awareness of abnormal movements (rate only patient's report): No Awareness, Dental Status Current problems with teeth and/or dentures?: No Does patient usually wear dentures?: No  CIWA:  CIWA-Ar Total: 0 COWS:     Musculoskeletal: Strength & Muscle Tone: within normal limits Gait & Station: normal Patient leans: N/A  Psychiatric Specialty Exam: Physical Exam  Constitutional: She is oriented to person, place, and time. She appears well-developed.  HENT:  Head: Normocephalic.  Eyes: Pupils are equal, round, and reactive to light.  Neck: Normal range of motion.  Cardiovascular: Normal rate.   Respiratory: Effort normal.  GI: Soft.  Genitourinary:  Denies any issues in this area  Musculoskeletal: Normal range of motion.  Neurological: She is alert and oriented to person, place, and time.  Skin: Skin is warm and dry.  Psychiatric: Her speech is normal and behavior is normal. Judgment and thought content normal. Her mood appears not anxious. Her affect is not angry, not blunt, not labile and not inappropriate. Cognition and memory are normal. She does not exhibit a depressed mood.    Review of Systems  Constitutional: Negative.   HENT: Negative.  Eyes: Negative.   Respiratory: Negative.   Cardiovascular: Negative.   Gastrointestinal: Negative.   Genitourinary: Negative.   Musculoskeletal: Negative.   Skin: Negative.   Neurological: Negative.   Endo/Heme/Allergies: Negative.   Psychiatric/Behavioral: Positive for depression (Stable) and substance abuse (Hx. alcoholism, chronic). Negative for suicidal ideas, hallucinations and memory loss. The patient has insomnia (Stable). The patient is not nervous/anxious.      Blood pressure 126/63, pulse 116, temperature 98.8 F (37.1 C), temperature source Oral, resp. rate 16, height 5' 5.5" (1.664 m), weight 74.39 kg (164 lb).Body mass index is 26.87 kg/(m^2).  See Md's SRA   Have you used any form of tobacco in the last 30 days? (Cigarettes, Smokeless Tobacco, Cigars, and/or Pipes): No  Has this patient used any form of tobacco in the last 30 days? (Cigarettes, Smokeless Tobacco, Cigars, and/or Pipes) Yes, Yes, A prescription for an FDA-approved tobacco cessation medication was offered at discharge and the patient refused  Blood Alcohol level:  Lab Results  Component Value Date   CentracareETH 228* 03/22/2016   Metabolic Disorder Labs:  Lab Results  Component Value Date   HGBA1C 5.3 03/24/2016   MPG 105 03/24/2016   No results found for: PROLACTIN Lab Results  Component Value Date   CHOL 215* 03/24/2016   TRIG 179* 03/24/2016   HDL 52 03/24/2016   CHOLHDL 4.1 03/24/2016   VLDL 36 03/24/2016   LDLCALC 127* 03/24/2016   See Psychiatric Specialty Exam and Suicide Risk Assessment completed by Attending Physician prior to discharge.  Discharge destination:  Home  Is patient on multiple antipsychotic therapies at discharge:  No   Has Patient had three or more failed trials of antipsychotic monotherapy by history:  No  Recommended Plan for Multiple Antipsychotic Therapies: NA    Medication List    STOP taking these medications        docusate sodium 100 MG capsule  Commonly known as:  COLACE      TAKE these medications      Indication   ARIPiprazole 2 MG tablet  Commonly known as:  ABILIFY  Take 1 tablet (2 mg total) by mouth at bedtime. For mood control   Indication:  Mood control     bacitracin ointment  Apply topically 2 (two) times daily. For wound care   Indication:  Wound care     doxepin 25 MG capsule  Commonly known as:  SINEQUAN  Take 1 capsule (25 mg total) by mouth at bedtime and may repeat dose one time if needed. For  depression   Indication:  Anxiety associated with Excessive Use of Alcohol     hydrOXYzine 25 MG tablet  Commonly known as:  ATARAX/VISTARIL  Take 1 tablet (25 mg total) by mouth every 6 (six) hours as needed for anxiety.   Indication:  Anxiety     loratadine 10 MG tablet  Commonly known as:  CLARITIN  Take 1 tablet (10 mg total) by mouth daily. For allergies   Indication:  Perennial Rhinitis, Hayfever     nicotine 7 mg/24hr patch  Commonly known as:  NICODERM CQ - dosed in mg/24 hr  Place 1 patch (7 mg total) onto the skin daily. For smoking cessation   Indication:  Nicotine Addiction     venlafaxine XR 150 MG 24 hr capsule  Commonly known as:  EFFEXOR-XR  Take 1 capsule (150 mg total) by mouth daily with breakfast. For depression   Indication:  Major Depressive Disorder  Follow-up Information    Follow up with [facility]  On 03/30/2016.   Why:  Appt on this date at 1:00PM. Please bring photo ID, Medicaid card with you to this appt. Thank you.    Contact information:   [address] [place], [place] [address] Phone: [phone] Fax: [phone]      Follow up with [facility] On 03/31/2016.   Why:  Medical care need   Contact information:   [address] [place] [address] [phone]     Follow-up recommendations: Activity:  As tolerated Diet: As recommended by your primary care doctor. Keep all scheduled follow-up appointments as recommended.   Comments: Patient is instructed prior to discharge to: Take all medications as prescribed by his/her mental healthcare provider. Report any adverse effects and or reactions from the medicines to his/her outpatient provider promptly. Patient has been instructed & cautioned: To not engage in alcohol and or illegal drug use while on prescription medicines. In the event of worsening symptoms, patient is instructed to call the crisis hotline, 911 and or go to the nearest  ED for appropriate evaluation and treatment of symptoms. To follow-up with his/her primary care provider for your other medical issues, concerns and or health care needs.   Signed: Sanjuana Kava, NP, PMHNP, FNP-BC 03/28/2016, 10:30 AM

## 2016-03-28 NOTE — [facility] Group Notes (Signed)
[facility] Group Notes:  (Nursing/MHT/Case Management/Adjunct)  Date:  03/28/2016  Time:  9:13 AM  Type of Therapy:  Psychoeducational Skills  Participation Level:  Did Not Attend  Patient invited; declined to attend.  Cranford MonBeaudry, Jordani Nunn Evans 03/28/2016, 9:13 AM

## 2016-03-28 NOTE — Progress Notes (Signed)
D: Pt endorsed moderate anxiety with mild depression. Pt is flat, isolative and withdrawn to self even while in the day room. Pt however, denied pain, SI, HI or AVH. Pt remained calm and cooperative. A: Medications offered as prescribed.  Support, encouragement, and safe environment provided.  15-minute safety checks continue. R: Pt was med compliant.  Pt attended AA group meeting. Safety checks continue.

## 2016-03-28 NOTE — [facility] Suicide Risk Assessment (Signed)
[facility]   Principal Problem: Severe recurrent major depression without psychotic features [facility](HCC) Discharge Diagnoses:  Patient Active Problem List   Diagnosis Date Noted  . Severe recurrent major depression without psychotic features (HCC) [F33.2] 03/23/2016  . Alcohol use disorder, severe, dependence (HCC) [F10.20] 03/22/2016    Total Time spent with patient: 30 minutes  Musculoskeletal: Strength & Muscle Tone: within normal limits Gait & Station: normal Patient leans: N/A  Psychiatric Specialty Exam: Review of Systems  Psychiatric/Behavioral: Positive for substance abuse. Negative for depression, suicidal ideas and hallucinations. The patient is not nervous/anxious.   All other systems reviewed and are negative.   Blood pressure 126/63, pulse 116, temperature 98.8 F (37.1 C), temperature source Oral, resp. rate 16, height 5' 5.5" (1.664 m), weight 74.39 kg (164 lb).Body mass index is 26.87 kg/(m^2).  General Appearance: Casual  Eye Contact::  Fair  Speech:  Clear and Coherent409  Volume:  Normal  Mood:  Euthymic  Affect:  Congruent  Thought Process:  Goal Directed  Orientation:  Full (Time, Place, and Person)  Thought Content:  Logical  Suicidal Thoughts:  No  Homicidal Thoughts:  No  Memory:  Immediate;   Fair Recent;   Fair Remote;   Fair  Judgement:  Fair  Insight:  Fair  Psychomotor Activity:  Normal  Concentration:  Fair  Recall:  FiservFair  Fund of Knowledge:Fair  Language: Fair  Akathisia:  No  Handed:  Right  AIMS (if indicated):     Assets:  Desire for Improvement  Sleep:  Number of Hours: 5.75  Cognition: WNL  ADL's:  Intact   Mental Status Per Nursing Assessment::   On Admission:  Suicidal ideation indicated by patient, Suicide plan, Plan includes specific time, place, or method, Self-harm thoughts, Self-harm behaviors, Intention to act on suicide plan, Belief that plan would result in death  Demographic Factors:   Caucasian  Loss Factors: NA  Historical Factors: Impulsivity  Risk Reduction Factors:   Positive social support  Continued Clinical Symptoms:  Alcohol/Substance Abuse/Dependencies Previous Psychiatric Diagnoses and Treatments  Cognitive Features That Contribute To Risk:  None    Suicide Risk:  Minimal: No identifiable suicidal ideation.  Patients presenting with no risk factors but with morbid ruminations; may be classified as minimal risk based on the severity of the depressive symptoms  Follow-up Information    Follow up with [facility] On 03/30/2016.   Why:  Appt on this date at 1:00PM. Please bring photo ID, Medicaid card with you to this appt. Thank you.    Contact information:   [address] [place], [place] [address] Phone: [phone] Fax: [phone]      Plan Of Care/Follow-up recommendations:  Activity:  no restrictions Diet:  regular Tests:  as needed Other:  follow up with aftercare  Maydell Knoebel, MD 03/28/2016, 9:45 AM

## 2016-03-28 NOTE — Progress Notes (Signed)
Discharge note:  Patient discharged home per MD order.  Patient received all personal belongings from unit and locker. AVS/discharge instructions reviewed with patient and she indicated understanding.  Patient denies SI/HI/AVH.  Patient understood all follow up appointments.  Patient left ambulatory with fiance.

## 2016-03-28 NOTE — [facility] Group Notes (Signed)
Patient did not attend group.

## 2016-03-29 ENCOUNTER — Telehealth: Payer: Self-pay | Admitting: Psychiatry

## 2016-03-29 LAB — HEPATITIS C ANTIBODY

## 2016-03-29 LAB — HEPATITIS B SURFACE ANTIGEN: HEP B S AG: NEGATIVE

## 2016-03-29 LAB — HEPATITIS B SURFACE ANTIBODY,QUALITATIVE: Hep B S Ab: REACTIVE

## 2016-03-29 NOTE — Telephone Encounter (Signed)
Called [name].S at [phone] to discuss her labs - hepatitis panel - her SO picked up the phone - discussed with him to convey message to call writer back at [phone].   [name] [employer] ,MD Attending Psychiatrist  [facility]

## 2019-03-09 ENCOUNTER — Encounter (HOSPITAL_COMMUNITY): Payer: Self-pay | Admitting: Emergency Medicine

## 2019-03-09 ENCOUNTER — Other Ambulatory Visit: Payer: Self-pay

## 2019-03-09 ENCOUNTER — Emergency Department (HOSPITAL_COMMUNITY)
Admission: EM | Admit: 2019-03-09 | Discharge: 2019-03-10 | Disposition: A | Discharge status: Home / Self Care | Payer: Medicaid Other | Attending: Emergency Medicine | Admitting: Emergency Medicine

## 2019-03-09 DIAGNOSIS — R1013 Epigastric pain: Secondary | ICD-10-CM

## 2019-03-09 DIAGNOSIS — R1012 Left upper quadrant pain: Secondary | ICD-10-CM

## 2019-03-09 DIAGNOSIS — F17210 Nicotine dependence, cigarettes, uncomplicated: Secondary | ICD-10-CM | POA: Insufficient documentation

## 2019-03-09 DIAGNOSIS — R109 Unspecified abdominal pain: Secondary | ICD-10-CM | POA: Diagnosis present

## 2019-03-09 DIAGNOSIS — Z79899 Other long term (current) drug therapy: Secondary | ICD-10-CM | POA: Diagnosis not present

## 2019-03-09 MED ORDER — SODIUM CHLORIDE 0.9 % IV BOLUS
1000.0000 mL | Freq: Once | INTRAVENOUS | Status: AC
  Administered 2019-03-09: 1000 mL via INTRAVENOUS

## 2019-03-09 MED ORDER — SUCRALFATE 1 G PO TABS
1.0000 g | ORAL_TABLET | Freq: Once | ORAL | Status: AC
  Administered 2019-03-09: 1 g via ORAL
  Filled 2019-03-09: qty 1

## 2019-03-09 MED ORDER — ONDANSETRON HCL 4 MG/2ML IJ SOLN
4.0000 mg | Freq: Once | INTRAMUSCULAR | Status: AC
  Administered 2019-03-09: 4 mg via INTRAVENOUS
  Filled 2019-03-09: qty 2

## 2019-03-09 MED ORDER — VITAMIN B-1 100 MG PO TABS
100.0000 mg | ORAL_TABLET | Freq: Once | ORAL | Status: AC
  Administered 2019-03-09: 100 mg via ORAL
  Filled 2019-03-09: qty 1

## 2019-03-09 MED ORDER — OXYCODONE-ACETAMINOPHEN 5-325 MG PO TABS
1.0000 | ORAL_TABLET | Freq: Once | ORAL | Status: AC
  Administered 2019-03-09: 1 via ORAL
  Filled 2019-03-09: qty 1

## 2019-03-09 NOTE — ED Triage Notes (Signed)
Pt reports having swelling and pain to left upper quadrant on abdomen and began having worsening pain along with nausea and vomiting.

## 2019-03-09 NOTE — ED Provider Notes (Signed)
[facility]-EMERGENCY DEPT Provider Note   CSN: [id number] Arrival date & time: 03/09/19  2252    History   Chief Complaint Chief Complaint  Patient presents with   Abdominal Pain    HPI Kathryn Holland is a 33 y.o. female.     HPI  33 year old female who is a recovering alcoholic comes in a chief complaint of abdominal pain.  Patient states that yesterday she had an alcoholic beverage with 1 of her friends.  Soon after she started having left-sided abdominal pain, nausea, vomiting.  Her pain is described as dull pain that is mild to moderate in severity.  Pain is constant, and it is worse with food intake.  Her emesis is also postprandial.  Patient denies any history of abdominal surgery.  She has no UTI-like symptoms, vaginal discharge, vaginal bleeding and denies any pelvic disorders.  Past Medical History:  Diagnosis Date   Gallstones    Kidney stone    Ovarian cyst     Patient Active Problem List   Diagnosis Date Noted   Severe recurrent major depression without psychotic features (HCC) 03/23/2016   Alcohol use disorder, severe, dependence (HCC) 03/22/2016    Past Surgical History:  Procedure Laterality Date   LITHOTRIPSY       OB History    Gravida  1   Para      Term      Preterm      AB      Living        SAB      TAB      Ectopic      Multiple      Live Births               Home Medications    Prior to Admission medications   Medication Sig Start Date End Date Taking? Authorizing Provider  amphetamine-dextroamphetamine (ADDERALL) 20 MG tablet Take 20 mg by mouth 2 (two) times daily.   Yes [provider]  ARIPiprazole (ABILIFY) 15 MG tablet Take 15 mg by mouth daily.   Yes [provider]  etonogestrel (NEXPLANON) 68 MG IMPL implant 68 mg by Subdermal route once.  11/28/12  Yes [provider]  traZODone (DESYREL) 50 MG tablet Take 50 mg by mouth at bedtime as needed for  sleep.   Yes [provider]  venlafaxine XR (EFFEXOR-XR) 75 MG 24 hr capsule Take 75 mg by mouth daily with breakfast.   Yes [provider]  ARIPiprazole (ABILIFY) 2 MG tablet Take 1 tablet (2 mg total) by mouth at bedtime. For mood control Patient not taking: Reported on 03/10/2019 03/28/16   Armandina Stammer I, NP  bacitracin ointment Apply topically 2 (two) times daily. For wound care Patient not taking: Reported on 03/10/2019 03/28/16   Armandina Stammer I, NP  doxepin (SINEQUAN) 25 MG capsule Take 1 capsule (25 mg total) by mouth at bedtime and may repeat dose one time if needed. For depression Patient not taking: Reported on 03/10/2019 03/28/16   Armandina Stammer I, NP  hydrOXYzine (ATARAX/VISTARIL) 25 MG tablet Take 1 tablet (25 mg total) by mouth every 6 (six) hours as needed for anxiety. Patient not taking: Reported on 03/10/2019 03/28/16   Armandina Stammer I, NP  loratadine (CLARITIN) 10 MG tablet Take 1 tablet (10 mg total) by mouth daily. For allergies Patient not taking: Reported on 03/10/2019 03/28/16   Armandina Stammer I, NP  nicotine (NICODERM CQ - DOSED IN MG/24 HR) 7  mg/24hr patch Place 1 patch (7 mg total) onto the skin daily. For smoking cessation Patient not taking: Reported on 03/10/2019 03/28/16   Armandina StammerNwoko, Agnes I, NP  ondansetron (ZOFRAN ODT) 8 MG disintegrating tablet Take 1 tablet (8 mg total) by mouth every 8 (eight) hours as needed for nausea. 03/10/19   Derwood KaplanNanavati, Rithik Odea, MD  sucralfate (CARAFATE) 1 g tablet Take 1 tablet (1 g total) by mouth 4 (four) times daily -  with meals and at bedtime. 03/10/19   Derwood KaplanNanavati, Ridge Lafond, MD  venlafaxine XR (EFFEXOR-XR) 150 MG 24 hr capsule Take 1 capsule (150 mg total) by mouth daily with breakfast. For depression Patient not taking: Reported on 03/10/2019 03/28/16   Sanjuana KavaNwoko, Agnes I, NP    Family History Family History  Problem Relation Age of Onset   Cancer Mother    Bipolar disorder Sister    Anxiety disorder Brother    ADD / ADHD Brother      Social History Social History   Tobacco Use   Smoking status: Current Every Day Smoker    Packs/day: 0.50    Types: Cigarettes   Smokeless tobacco: Never Used  Substance Use Topics   Alcohol use: Yes    Comment: socially   Drug use: No     Allergies   Stadol [butorphanol tartrate]   Review of Systems Review of Systems  Constitutional: Positive for activity change.  Respiratory: Negative for cough and shortness of breath.   Cardiovascular: Negative for chest pain.  Gastrointestinal: Positive for abdominal pain, nausea and vomiting.  Genitourinary: Negative for dysuria.  All other systems reviewed and are negative.    Physical Exam Updated Vital Signs BP 112/84 (BP Location: Right Arm)    Pulse 89    Temp 98.9 F (37.2 C) (Oral)    Resp 18    Ht 5\' 8"  (1.727 m)    Wt 70.8 kg    SpO2 100%    BMI 23.72 kg/m   Physical Exam Vitals signs and nursing note reviewed.  Constitutional:      Appearance: She is well-developed.  HENT:     Head: Normocephalic and atraumatic.  Eyes:     Pupils: Pupils are equal, round, and reactive to light.  Neck:     Musculoskeletal: Neck supple.  Cardiovascular:     Rate and Rhythm: Normal rate and regular rhythm.     Heart sounds: Normal heart sounds. No murmur.  Pulmonary:     Effort: Pulmonary effort is normal. No respiratory distress.  Abdominal:     General: There is no distension.     Palpations: Abdomen is soft.     Tenderness: There is abdominal tenderness in the epigastric area and left upper quadrant. There is no guarding or rebound.  Skin:    General: Skin is warm and dry.  Neurological:     Mental Status: She is alert and oriented to person, place, and time.      ED Treatments / Results  Labs (all labs ordered are listed, but only abnormal results are displayed) Labs Reviewed  CBC WITH DIFFERENTIAL/PLATELET - Abnormal; Notable for the following components:      Result Value   WBC 15.7 (*)    Platelets 509  (*)    Neutro Abs 9.4 (*)    Lymphs Abs 4.4 (*)    Monocytes Absolute 1.3 (*)    All other components within normal limits  COMPREHENSIVE METABOLIC PANEL - Abnormal; Notable for the following components:   Total Protein  8.4 (*)    All other components within normal limits  LIPASE, BLOOD  MAGNESIUM    EKG None  Radiology Dg Chest Port 1 View  Result Date: 03/10/2019 CLINICAL DATA:  33 year old female with left upper quadrant abdominal pain. Evaluate for hernia. EXAM: PORTABLE CHEST 1 VIEW COMPARISON:  Chest radiograph dated 12/30/2018 FINDINGS: The heart size and mediastinal contours are within normal limits. Both lungs are clear. The visualized skeletal structures are unremarkable. IMPRESSION: No active disease. Electronically Signed   By: Elgie Collard M.D.   On: 03/10/2019 00:31    Procedures Procedures (including critical care time)  Medications Ordered in ED Medications  sodium chloride 0.9 % bolus 1,000 mL (0 mLs Intravenous Stopped 03/10/19 0302)  ondansetron (ZOFRAN) injection 4 mg (4 mg Intravenous Given 03/09/19 2352)  sucralfate (CARAFATE) tablet 1 g (1 g Oral Given 03/09/19 2336)  thiamine (VITAMIN B-1) tablet 100 mg (100 mg Oral Given 03/09/19 2336)  oxyCODONE-acetaminophen (PERCOCET/ROXICET) 5-325 MG per tablet 1 tablet (1 tablet Oral Given 03/09/19 2336)  promethazine (PHENERGAN) tablet 25 mg (25 mg Oral Given 03/10/19 0256)  alum & mag hydroxide-simeth (MAALOX/MYLANTA) 200-200-20 MG/5ML suspension 30 mL (30 mLs Oral Given 03/10/19 0257)    And  lidocaine (XYLOCAINE) 2 % viscous mouth solution 15 mL (15 mLs Oral Given 03/10/19 0257)     Initial Impression / Assessment and Plan / ED Course  I have reviewed the triage vital signs and the nursing notes.  Pertinent labs & imaging results that were available during my care of the patient were reviewed by me and considered in my medical decision making (see chart for details).  Clinical Course as of Mar 09 2308  Mon Mar 10, 2019  0240 Elevated white count, which appears to be nonspecific.    WBC(!): 15.7 [AN]  0240 Pt reassessed. Pt's VSS and WNL. Pt's cap refill < 3 seconds. Pt has been hydrated in the ER and now passed po challenge. We will discharge with antiemetic. Strict ER return precautions have been discussed and pt will return if he is unable to tolerate fluids and symptoms are getting worse.    [AN]    Clinical Course User Index [AN] Derwood Kaplan, MD       DDx includes: Pancreatitis Hepatobiliary pathology including cholecystitis Gastritis/PUD SBO ACS syndrome Aortic Dissection  33 year old female comes in a chief complaint of abdominal pain, nausea, vomiting.  Her symptoms started after she had an alcoholic beverage yesterday.  She has history of alcoholism, but states that she has been recovering alcoholic since a year ago and she had not had an alcoholic beverage for several months prior to yesterday.  On exam she has epigastric and left upper quadrant tenderness.  There is some bulging appreciated in the epigastric region, but there is no guarding, rebound.  No signs of hernia appreciated.  Based on history and exam, it appears that the symptoms, which are worst after p.o. intake, is likely because of gastritis.  Pancreatitis is also possible.  Basic labs ordered for now.  Chest x-ray ordered to see if there is any evidence of hiatal hernia.   Final Clinical Impressions(s) / ED Diagnoses   Final diagnoses:  Left upper quadrant pain  Acute epigastric pain    ED Discharge Orders         Ordered    ondansetron (ZOFRAN ODT) 8 MG disintegrating tablet  Every 8 hours PRN     03/10/19 0241    sucralfate (CARAFATE) 1 g tablet  3 times daily with meals & bedtime     03/10/19 0241           Derwood Kaplan, MD 03/10/19 2310

## 2019-03-10 ENCOUNTER — Emergency Department (HOSPITAL_COMMUNITY): Payer: Medicaid Other

## 2019-03-10 LAB — COMPREHENSIVE METABOLIC PANEL
ALT: 34 U/L (ref 0–44)
AST: 28 U/L (ref 15–41)
Albumin: 4.6 g/dL (ref 3.5–5.0)
Alkaline Phosphatase: 82 U/L (ref 38–126)
Anion gap: 11 (ref 5–15)
BUN: 11 mg/dL (ref 6–20)
CO2: 26 mmol/L (ref 22–32)
Calcium: 9.7 mg/dL (ref 8.9–10.3)
Chloride: 103 mmol/L (ref 98–111)
Creatinine, Ser: 0.74 mg/dL (ref 0.44–1.00)
GFR calc Af Amer: >60 mL/min (ref >60)
GFR calc non Af Amer: >60 mL/min (ref >60)
Glucose, Bld: 99 mg/dL (ref 70–99)
Potassium: 4 mmol/L (ref 3.5–5.1)
Sodium: 140 mmol/L (ref 135–145)
Total Bilirubin: 0.5 mg/dL (ref 0.3–1.2)
Total Protein: 8.4 g/dL — ABNORMAL HIGH (ref 6.5–8.1)

## 2019-03-10 LAB — CBC WITH DIFFERENTIAL/PLATELET
Abs Immature Granulocytes: 0.05 10*3/uL (ref 0.00–0.07)
Basophils Absolute: 0.1 10*3/uL (ref 0.0–0.1)
Basophils Relative: 1 %
Eosinophils Absolute: 0.4 10*3/uL (ref 0.0–0.5)
Eosinophils Relative: 3 %
HCT: 44.1 % (ref 36.0–46.0)
Hemoglobin: 15 g/dL (ref 12.0–15.0)
Immature Granulocytes: 0 %
Lymphocytes Relative: 28 %
Lymphs Abs: 4.4 10*3/uL — ABNORMAL HIGH (ref 0.7–4.0)
MCH: 29.9 pg (ref 26.0–34.0)
MCHC: 34 g/dL (ref 30.0–36.0)
MCV: 88 fL (ref 80.0–100.0)
Monocytes Absolute: 1.3 10*3/uL — ABNORMAL HIGH (ref 0.1–1.0)
Monocytes Relative: 8 %
Neutro Abs: 9.4 10*3/uL — ABNORMAL HIGH (ref 1.7–7.7)
Neutrophils Relative %: 60 %
Platelets: 509 10*3/uL — ABNORMAL HIGH (ref 150–400)
RBC: 5.01 MIL/uL (ref 3.87–5.11)
RDW: 11.9 % (ref 11.5–15.5)
WBC: 15.7 10*3/uL — ABNORMAL HIGH (ref 4.0–10.5)
nRBC: 0 % (ref 0.0–0.2)

## 2019-03-10 LAB — LIPASE, BLOOD: Lipase: 31 U/L (ref 11–51)

## 2019-03-10 LAB — MAGNESIUM: Magnesium: 2 mg/dL (ref 1.7–2.4)

## 2019-03-10 MED ORDER — SUCRALFATE 1 G PO TABS: 1.0000 g | ORAL_TABLET | Freq: Three times a day (TID) | ORAL | 0 refills | Status: DC

## 2019-03-10 MED ORDER — ALUM & MAG HYDROXIDE-SIMETH 200-200-20 MG/5ML PO SUSP
30.0000 mL | Freq: Once | ORAL | Status: AC
  Administered 2019-03-10: 03:00:00 30 mL via ORAL
  Filled 2019-03-10: qty 30

## 2019-03-10 MED ORDER — LIDOCAINE VISCOUS HCL 2 % MT SOLN
15.0000 mL | Freq: Once | OROMUCOSAL | Status: AC
  Administered 2019-03-10: 15 mL via ORAL
  Filled 2019-03-10: qty 15

## 2019-03-10 MED ORDER — PROMETHAZINE HCL 25 MG PO TABS
25.0000 mg | ORAL_TABLET | Freq: Once | ORAL | Status: AC
  Administered 2019-03-10: 03:00:00 25 mg via ORAL
  Filled 2019-03-10: qty 1

## 2019-03-10 MED ORDER — ONDANSETRON 8 MG PO TBDP: 8.0000 mg | ORAL_TABLET | Freq: Three times a day (TID) | ORAL | 0 refills | Status: DC | PRN

## 2019-03-10 NOTE — ED Notes (Signed)
X-ray at bedside

## 2019-03-10 NOTE — Discharge Instructions (Signed)
We saw you in the ER for the nausea and abdominal pain. All the results in the ER are overall reassuring. We are not sure what is causing your symptoms. It is possible that the symptoms are because of gastritis.  The workup in the ER is not complete, and is limited to screening for life threatening and emergent conditions only, so please see a primary care doctor for further evaluation.  Please return to the ER if your symptoms worsen; you have increased pain, fevers, chills, inability to keep any medications down. Otherwise see the outpatient doctor as requested.

## 2019-08-31 ENCOUNTER — Emergency Department (HOSPITAL_COMMUNITY)
Admission: EM | Admit: 2019-08-31 | Discharge: 2019-09-01 | Disposition: A | Discharge status: Home / Self Care | Payer: Medicaid Other | Attending: Emergency Medicine | Admitting: Emergency Medicine

## 2019-08-31 ENCOUNTER — Other Ambulatory Visit: Payer: Self-pay

## 2019-08-31 ENCOUNTER — Encounter (HOSPITAL_COMMUNITY): Payer: Self-pay

## 2019-08-31 DIAGNOSIS — Z793 Long term (current) use of hormonal contraceptives: Secondary | ICD-10-CM | POA: Insufficient documentation

## 2019-08-31 DIAGNOSIS — Z79899 Other long term (current) drug therapy: Secondary | ICD-10-CM | POA: Diagnosis not present

## 2019-08-31 DIAGNOSIS — R1012 Left upper quadrant pain: Secondary | ICD-10-CM | POA: Diagnosis not present

## 2019-08-31 DIAGNOSIS — F17210 Nicotine dependence, cigarettes, uncomplicated: Secondary | ICD-10-CM | POA: Diagnosis not present

## 2019-08-31 LAB — URINALYSIS, ROUTINE W REFLEX MICROSCOPIC
Bilirubin Urine: NEGATIVE
Glucose, UA: NEGATIVE mg/dL
Hgb urine dipstick: NEGATIVE
Ketones, ur: NEGATIVE mg/dL
Leukocytes,Ua: NEGATIVE
Nitrite: NEGATIVE
Protein, ur: NEGATIVE mg/dL
Specific Gravity, Urine: 1.016 (ref 1.005–1.030)
pH: 8 (ref 5.0–8.0)

## 2019-08-31 LAB — CBC
HCT: 40.6 % (ref 36.0–46.0)
Hemoglobin: 13.8 g/dL (ref 12.0–15.0)
MCH: 30.2 pg (ref 26.0–34.0)
MCHC: 34 g/dL (ref 30.0–36.0)
MCV: 88.8 fL (ref 80.0–100.0)
Platelets: 426 10*3/uL — ABNORMAL HIGH (ref 150–400)
RBC: 4.57 MIL/uL (ref 3.87–5.11)
RDW: 11.9 % (ref 11.5–15.5)
WBC: 12.4 10*3/uL — ABNORMAL HIGH (ref 4.0–10.5)
nRBC: 0 % (ref 0.0–0.2)

## 2019-08-31 LAB — I-STAT BETA HCG BLOOD, ED (MC, WL, AP ONLY): I-stat hCG, quantitative: <5 m[IU]/mL (ref <5)

## 2019-08-31 MED ORDER — SODIUM CHLORIDE 0.9% FLUSH: 3.0000 mL | Freq: Once | INTRAVENOUS | Status: DC

## 2019-08-31 NOTE — ED Notes (Signed)
Attempted to get blood draw on pt, unable, pt is difficult stick

## 2019-08-31 NOTE — ED Triage Notes (Signed)
Pt c/o LUQ abd pain x this morning. Pt states taht she has had nausea and some diarrhea. Denies CP, SHOB, cough.

## 2019-09-01 LAB — LIPASE, BLOOD: Lipase: 29 U/L (ref 11–51)

## 2019-09-01 LAB — COMPREHENSIVE METABOLIC PANEL
ALT: 30 U/L (ref 0–44)
AST: 28 U/L (ref 15–41)
Albumin: 3.6 g/dL (ref 3.5–5.0)
Alkaline Phosphatase: 57 U/L (ref 38–126)
Anion gap: 9 (ref 5–15)
BUN: 9 mg/dL (ref 6–20)
CO2: 22 mmol/L (ref 22–32)
Calcium: 8.6 mg/dL — ABNORMAL LOW (ref 8.9–10.3)
Chloride: 106 mmol/L (ref 98–111)
Creatinine, Ser: 0.67 mg/dL (ref 0.44–1.00)
GFR calc Af Amer: >60 mL/min (ref >60)
GFR calc non Af Amer: >60 mL/min (ref >60)
Glucose, Bld: 119 mg/dL — ABNORMAL HIGH (ref 70–99)
Potassium: 3.4 mmol/L — ABNORMAL LOW (ref 3.5–5.1)
Sodium: 137 mmol/L (ref 135–145)
Total Bilirubin: 0.6 mg/dL (ref 0.3–1.2)
Total Protein: 7 g/dL (ref 6.5–8.1)

## 2019-09-01 MED ORDER — SODIUM CHLORIDE 0.9 % IV BOLUS
1000.0000 mL | Freq: Once | INTRAVENOUS | Status: AC
  Administered 2019-09-01: 1000 mL via INTRAVENOUS

## 2019-09-01 MED ORDER — FAMOTIDINE 20 MG PO TABS: 20.0000 mg | ORAL_TABLET | Freq: Two times a day (BID) | ORAL | 0 refills | Status: DC

## 2019-09-01 MED ORDER — ONDANSETRON HCL 4 MG/2ML IJ SOLN
4.0000 mg | Freq: Once | INTRAMUSCULAR | Status: AC
  Administered 2019-09-01: 4 mg via INTRAVENOUS
  Filled 2019-09-01: qty 2

## 2019-09-01 MED ORDER — FENTANYL CITRATE (PF) 100 MCG/2ML IJ SOLN
50.0000 ug | Freq: Once | INTRAMUSCULAR | Status: AC
  Administered 2019-09-01: 50 ug via INTRAVENOUS
  Filled 2019-09-01: qty 2

## 2019-09-01 MED ORDER — SUCRALFATE 1 GM/10ML PO SUSP: 1.0000 g | Freq: Three times a day (TID) | ORAL | 0 refills | Status: DC

## 2019-09-01 MED ORDER — SUCRALFATE 1 G PO TABS
1.0000 g | ORAL_TABLET | Freq: Once | ORAL | Status: AC
  Administered 2019-09-01: 1 g via ORAL
  Filled 2019-09-01: qty 1

## 2019-09-01 NOTE — Discharge Instructions (Signed)
Take Carafate and Pepcid as directed.  Follow up with GI as directed.   Return to the Emergency Department immediately if you experience any worsening abdominal pain, fever, persistent nausea and vomiting, inability keep any food down, pain with urination, blood in your urine or any other worsening or concerning symptoms.

## 2019-09-01 NOTE — ED Provider Notes (Addendum)
[facility]-EMERGENCY DEPT Provider Note   CSN: [id number] Arrival date & time: 08/31/19  1757     History   Chief Complaint Chief Complaint  Patient presents with  . Abdominal Pain    HPI Kathryn Holland is a 33 y.o. female with PMH/o kidney stones, gallstones who presents for evaluation of left upper quadrant abdominal pain that is been intermittently occurring over the last few weeks.  Reports it has been worse today.  She states that is in the left left upper quadrant and is a dull aching pain.  She reports she sometimes feels like the swollen.  She states that the pain is worse after eating.  She has had some nausea but no vomiting.  She also reports she has had several episodes of nonbloody diarrhea.  She does have a history of alcoholism.  She states that her last drink was about a week ago.  She states that she drank about a fourth of a 4 Loko.  Denies any other alcohol use.  She does smoke cigarettes and smokes about 5 cigarettes a day.  No NSAID use.  Denies any fevers, chest pain, difficulty breathing, urinary complaints.     The history is provided by the patient.    Past Medical History:  Diagnosis Date  . Gallstones   . Kidney stone   . Ovarian cyst     Patient Active Problem List   Diagnosis Date Noted  . Severe recurrent major depression without psychotic features (HCC) 03/23/2016  . Alcohol use disorder, severe, dependence (HCC) 03/22/2016    Past Surgical History:  Procedure Laterality Date  . LITHOTRIPSY       OB History    Gravida  1   Para      Term      Preterm      AB      Living        SAB      TAB      Ectopic      Multiple      Live Births               Home Medications    Prior to Admission medications   Medication Sig Start Date End Date Taking? Authorizing Provider  amphetamine-dextroamphetamine (ADDERALL) 20 MG tablet Take 20 mg by mouth 2 (two) times daily.    [provider]   ARIPiprazole (ABILIFY) 15 MG tablet Take 15 mg by mouth daily.    [provider]  ARIPiprazole (ABILIFY) 2 MG tablet Take 1 tablet (2 mg total) by mouth at bedtime. For mood control Patient not taking: Reported on 03/10/2019 03/28/16   Armandina Stammer I, NP  bacitracin ointment Apply topically 2 (two) times daily. For wound care Patient not taking: Reported on 03/10/2019 03/28/16   Armandina Stammer I, NP  doxepin (SINEQUAN) 25 MG capsule Take 1 capsule (25 mg total) by mouth at bedtime and may repeat dose one time if needed. For depression Patient not taking: Reported on 03/10/2019 03/28/16   Armandina Stammer I, NP  etonogestrel (NEXPLANON) 68 MG IMPL implant 68 mg by Subdermal route once.  11/28/12   [provider]  famotidine (PEPCID) 20 MG tablet Take 1 tablet (20 mg total) by mouth 2 (two) times daily for 7 days. 09/01/19 09/08/19  Maxwell Caul, PA-C  hydrOXYzine (ATARAX/VISTARIL) 25 MG tablet Take 1 tablet (25 mg total) by mouth every 6 (six) hours as needed for anxiety. Patient not taking: Reported on  03/10/2019 03/28/16   Lindell Spar I, NP  loratadine (CLARITIN) 10 MG tablet Take 1 tablet (10 mg total) by mouth daily. For allergies Patient not taking: Reported on 03/10/2019 03/28/16   Lindell Spar I, NP  nicotine (NICODERM CQ - DOSED IN MG/24 HR) 7 mg/24hr patch Place 1 patch (7 mg total) onto the skin daily. For smoking cessation Patient not taking: Reported on 03/10/2019 03/28/16   Lindell Spar I, NP  ondansetron (ZOFRAN ODT) 8 MG disintegrating tablet Take 1 tablet (8 mg total) by mouth every 8 (eight) hours as needed for nausea. 03/10/19   Varney Biles, MD  sucralfate (CARAFATE) 1 GM/10ML suspension Take 10 mLs (1 g total) by mouth 4 (four) times daily -  with meals and at bedtime. 09/01/19   Volanda Napoleon, PA-C  traZODone (DESYREL) 50 MG tablet Take 50 mg by mouth at bedtime as needed for sleep.    [provider]  venlafaxine XR (EFFEXOR-XR) 150 MG 24 hr capsule Take 1  capsule (150 mg total) by mouth daily with breakfast. For depression Patient not taking: Reported on 03/10/2019 03/28/16   Lindell Spar I, NP  venlafaxine XR (EFFEXOR-XR) 75 MG 24 hr capsule Take 75 mg by mouth daily with breakfast.    [provider]    Family History Family History  Problem Relation Age of Onset  . Cancer Mother   . Bipolar disorder Sister   . Anxiety disorder Brother   . ADD / ADHD Brother     Social History Social History   Tobacco Use  . Smoking status: Current Every Day Smoker    Packs/day: 0.50    Types: Cigarettes  . Smokeless tobacco: Never Used  Substance Use Topics  . Alcohol use: Yes    Comment: socially  . Drug use: No     Allergies   Stadol [butorphanol tartrate]   Review of Systems Review of Systems  Constitutional: Negative for fever.  Respiratory: Negative for cough and shortness of breath.   Cardiovascular: Negative for chest pain.  Gastrointestinal: Positive for abdominal pain and nausea. Negative for vomiting.  Genitourinary: Negative for dysuria and hematuria.  Neurological: Negative for headaches.  All other systems reviewed and are negative.    Physical Exam Updated Vital Signs BP (!) 125/99   Pulse 95   Temp 98.3 F (36.8 C) (Oral)   Resp 15   Wt 71 kg   LMP 08/14/2019   SpO2 100%   BMI 23.80 kg/m   Physical Exam Vitals signs and nursing note reviewed.  Constitutional:      Appearance: Normal appearance. She is well-developed.  HENT:     Head: Normocephalic and atraumatic.  Eyes:     General: Lids are normal.     Conjunctiva/sclera: Conjunctivae normal.     Pupils: Pupils are equal, round, and reactive to light.  Neck:     Musculoskeletal: Full passive range of motion without pain.  Cardiovascular:     Rate and Rhythm: Normal rate and regular rhythm.     Pulses: Normal pulses.     Heart sounds: Normal heart sounds. No murmur. No friction rub. No gallop.   Pulmonary:     Effort: Pulmonary effort  is normal.     Breath sounds: Normal breath sounds.     Comments: Lungs clear to auscultation bilaterally.  Symmetric chest rise.  No wheezing, rales, rhonchi. Abdominal:     Palpations: Abdomen is soft. Abdomen is not rigid.     Tenderness: There  is abdominal tenderness in the left upper quadrant. There is no right CVA tenderness, left CVA tenderness or guarding.     Comments: Abdomen is soft, nondistended.  No appreciable swelling.  Tenderness palpation in left upper quadrant.  No rigidity, guarding.  No CVA tenderness noted bilaterally.  Musculoskeletal: Normal range of motion.  Skin:    General: Skin is warm and dry.     Capillary Refill: Capillary refill takes less than 2 seconds.  Neurological:     Mental Status: She is alert and oriented to person, place, and time.  Psychiatric:        Speech: Speech normal.      ED Treatments / Results  Labs (all labs ordered are listed, but only abnormal results are displayed) Labs Reviewed  CBC - Abnormal; Notable for the following components:      Result Value   WBC 12.4 (*)    Platelets 426 (*)    All other components within normal limits  COMPREHENSIVE METABOLIC PANEL - Abnormal; Notable for the following components:   Potassium 3.4 (*)    Glucose, Bld 119 (*)    Calcium 8.6 (*)    All other components within normal limits  URINALYSIS, ROUTINE W REFLEX MICROSCOPIC  LIPASE, BLOOD  I-STAT BETA HCG BLOOD, ED ([facility], [facility], [facility] ONLY)    EKG None  Radiology No results found.  Procedures Procedures (including critical care time)  Medications Ordered in ED Medications  sodium chloride flush (NS) 0.9 % injection 3 mL (has no administration in time range)  sodium chloride 0.9 % bolus 1,000 mL (1,000 mLs Intravenous New Bag/Given 09/01/19 0039)  ondansetron (ZOFRAN) injection 4 mg (4 mg Intravenous Given 09/01/19 0039)  sucralfate (CARAFATE) tablet 1 g (1 g Oral Given 09/01/19 0040)  fentaNYL (SUBLIMAZE) injection 50 mcg (50 mcg  Intravenous Given 09/01/19 0039)     Initial Impression / Assessment and Plan / ED Course  I have reviewed the triage vital signs and the nursing notes.  Pertinent labs & imaging results that were available during my care of the patient were reviewed by me and considered in my medical decision making (see chart for details).        33 year old female with past medical history of kidney stones, gallstones who presents for evaluation of left upper quadrant pain that is been intermittently occurring gotten worse.  Associate with nausea.  Worse after eating.  No fevers, urinary complaints.  She reports that sometimes when she has the pain, she feels like her left upper quadrant swells up and goes into her rib cage or otherwise denies any chest pain, difficulty breathing. Patient is afebrile, non-toxic appearing, sitting comfortably on examination table. Vital signs reviewed and stable. Tenderness palpation noted to left upper quadrant. Consider gastritis vs pancreatitis.  No urinary complaints.  History/physical exam not concerning for hepatobiliary etiology, kidney stones, appendicitis, diverticulitis, ovarian etiology. Plan for labs.   I-STAT beta negative.  CBC shows slight leukocytosis of 12.4.  UA negative for any infectious etiology.  CMP shows slight hypokalemia of 3.4.  BUN and creatinine within normal limits.  LFTs within normal limits.  Lipase is negative.  Reevaluation.  Patient reports improvement after medications.  She is able to tolerate p.o. in the department any difficulty.  Repeat abdominal exam shows improved tenderness.  No rigidity, guarding.  At this time, she does not have any tenderness noted to the lower abdomen.  Do not feel that she needs CT abdomen pelvis is do not have  any concern for surgical abdomen.  Suspect this is most likely gastritis versus PUD.  Will plan for PPI and Carafate.  Additionally, patient referred to outpatient GI. At this time, patient exhibits no emergent  life-threatening condition that require further evaluation in ED. Patient had ample opportunity for questions and discussion. All patient's questions were answered with full understanding. Strict return precautions discussed. Patient expresses understanding and agreement to plan.   Portions of this note were generated with Scientist, clinical (histocompatibility and immunogenetics)Dragon dictation software. Dictation errors may occur despite best attempts at proofreading.   Final Clinical Impressions(s) / ED Diagnoses   Final diagnoses:  Left upper quadrant abdominal pain    ED Discharge Orders         Ordered    famotidine (PEPCID) 20 MG tablet  2 times daily     09/01/19 0219    sucralfate (CARAFATE) 1 GM/10ML suspension  3 times daily with meals & bedtime     09/01/19 0219           Maxwell CaulLayden, Kingston Shawgo A, PA-C 09/01/19 0057    Maxwell CaulLayden, Kamiyah Kindel A, PA-C 09/01/19 0220    Palumbo, April, MD 09/01/19 [id number]

## 2019-09-11 ENCOUNTER — Other Ambulatory Visit: Payer: Self-pay

## 2019-09-11 ENCOUNTER — Encounter (HOSPITAL_COMMUNITY): Payer: Self-pay

## 2019-09-11 ENCOUNTER — Emergency Department (HOSPITAL_COMMUNITY)
Admission: EM | Admit: 2019-09-11 | Discharge: 2019-09-11 | Disposition: A | Discharge status: Home / Self Care | Payer: Medicaid Other | Attending: Emergency Medicine | Admitting: Emergency Medicine

## 2019-09-11 DIAGNOSIS — F17210 Nicotine dependence, cigarettes, uncomplicated: Secondary | ICD-10-CM | POA: Diagnosis not present

## 2019-09-11 DIAGNOSIS — R197 Diarrhea, unspecified: Secondary | ICD-10-CM | POA: Insufficient documentation

## 2019-09-11 DIAGNOSIS — E876 Hypokalemia: Secondary | ICD-10-CM | POA: Diagnosis not present

## 2019-09-11 DIAGNOSIS — Z79899 Other long term (current) drug therapy: Secondary | ICD-10-CM | POA: Insufficient documentation

## 2019-09-11 DIAGNOSIS — R1012 Left upper quadrant pain: Secondary | ICD-10-CM | POA: Diagnosis not present

## 2019-09-11 DIAGNOSIS — R1031 Right lower quadrant pain: Secondary | ICD-10-CM | POA: Insufficient documentation

## 2019-09-11 DIAGNOSIS — R112 Nausea with vomiting, unspecified: Secondary | ICD-10-CM | POA: Diagnosis not present

## 2019-09-11 HISTORY — DX: Peptic ulcer, site unspecified, unspecified as acute or chronic, without hemorrhage or perforation: K27.9

## 2019-09-11 LAB — CBC
HCT: 43.5 % (ref 36.0–46.0)
Hemoglobin: 14.9 g/dL (ref 12.0–15.0)
MCH: 30.3 pg (ref 26.0–34.0)
MCHC: 34.3 g/dL (ref 30.0–36.0)
MCV: 88.4 fL (ref 80.0–100.0)
Platelets: 479 10*3/uL — ABNORMAL HIGH (ref 150–400)
RBC: 4.92 MIL/uL (ref 3.87–5.11)
RDW: 11.8 % (ref 11.5–15.5)
WBC: 15.8 10*3/uL — ABNORMAL HIGH (ref 4.0–10.5)
nRBC: 0 % (ref 0.0–0.2)

## 2019-09-11 LAB — COMPREHENSIVE METABOLIC PANEL
ALT: 27 U/L (ref 0–44)
AST: 20 U/L (ref 15–41)
Albumin: 4.6 g/dL (ref 3.5–5.0)
Alkaline Phosphatase: 67 U/L (ref 38–126)
Anion gap: 13 (ref 5–15)
BUN: 11 mg/dL (ref 6–20)
CO2: 23 mmol/L (ref 22–32)
Calcium: 9.2 mg/dL (ref 8.9–10.3)
Chloride: 96 mmol/L — ABNORMAL LOW (ref 98–111)
Creatinine, Ser: 0.79 mg/dL (ref 0.44–1.00)
GFR calc Af Amer: >60 mL/min (ref >60)
GFR calc non Af Amer: >60 mL/min (ref >60)
Glucose, Bld: 96 mg/dL (ref 70–99)
Potassium: 3 mmol/L — ABNORMAL LOW (ref 3.5–5.1)
Sodium: 132 mmol/L — ABNORMAL LOW (ref 135–145)
Total Bilirubin: 0.5 mg/dL (ref 0.3–1.2)
Total Protein: 8.4 g/dL — ABNORMAL HIGH (ref 6.5–8.1)

## 2019-09-11 LAB — URINALYSIS, ROUTINE W REFLEX MICROSCOPIC
Bilirubin Urine: NEGATIVE
Glucose, UA: NEGATIVE mg/dL
Hgb urine dipstick: NEGATIVE
Ketones, ur: 5 mg/dL — AB
Nitrite: NEGATIVE
Protein, ur: NEGATIVE mg/dL
Specific Gravity, Urine: 1.023 (ref 1.005–1.030)
pH: 5 (ref 5.0–8.0)

## 2019-09-11 LAB — I-STAT BETA HCG BLOOD, ED (MC, WL, AP ONLY): I-stat hCG, quantitative: <5 m[IU]/mL (ref <5)

## 2019-09-11 LAB — LIPASE, BLOOD: Lipase: 26 U/L (ref 11–51)

## 2019-09-11 MED ORDER — OMEPRAZOLE 40 MG PO CPDR: 40.0000 mg | DELAYED_RELEASE_CAPSULE | Freq: Every day | ORAL | 0 refills | Status: DC

## 2019-09-11 MED ORDER — PANTOPRAZOLE SODIUM 40 MG IV SOLR
40.0000 mg | Freq: Once | INTRAVENOUS | Status: AC
  Administered 2019-09-11: 05:00:00 40 mg via INTRAVENOUS
  Filled 2019-09-11: qty 40

## 2019-09-11 MED ORDER — ONDANSETRON HCL 4 MG/2ML IJ SOLN
4.0000 mg | Freq: Once | INTRAMUSCULAR | Status: AC
  Administered 2019-09-11: 4 mg via INTRAVENOUS
  Filled 2019-09-11: qty 2

## 2019-09-11 MED ORDER — ONDANSETRON 4 MG PO TBDP: 4.0000 mg | ORAL_TABLET | Freq: Once | ORAL | Status: DC | PRN

## 2019-09-11 MED ORDER — SUCRALFATE 1 GM/10ML PO SUSP
1.0000 g | Freq: Once | ORAL | Status: AC
  Administered 2019-09-11: 05:00:00 1 g via ORAL
  Filled 2019-09-11: qty 10

## 2019-09-11 MED ORDER — SODIUM CHLORIDE 0.9 % IV BOLUS
1000.0000 mL | Freq: Once | INTRAVENOUS | Status: AC
  Administered 2019-09-11: 05:00:00 1000 mL via INTRAVENOUS

## 2019-09-11 MED ORDER — SUCRALFATE 1 G PO TABS: 1.0000 g | ORAL_TABLET | Freq: Three times a day (TID) | ORAL | 1 refills | Status: DC

## 2019-09-11 MED ORDER — STERILE WATER FOR INJECTION IJ SOLN
INTRAMUSCULAR | Status: AC
  Administered 2019-09-11: 05:00:00
  Filled 2019-09-11: qty 10

## 2019-09-11 MED ORDER — POTASSIUM CHLORIDE CRYS ER 20 MEQ PO TBCR
40.0000 meq | EXTENDED_RELEASE_TABLET | Freq: Once | ORAL | Status: AC
  Administered 2019-09-11: 40 meq via ORAL
  Filled 2019-09-11: qty 2

## 2019-09-11 MED ORDER — SODIUM CHLORIDE 0.9% FLUSH
3.0000 mL | Freq: Once | INTRAVENOUS | Status: AC
  Administered 2019-09-11: 04:00:00 3 mL via INTRAVENOUS

## 2019-09-11 MED ORDER — POTASSIUM CHLORIDE CRYS ER 20 MEQ PO TBCR: 20.0000 meq | EXTENDED_RELEASE_TABLET | Freq: Two times a day (BID) | ORAL | 0 refills | Status: DC

## 2019-09-11 NOTE — ED Provider Notes (Signed)
[facility]-EMERGENCY DEPT Provider Note: Lowella Dell, MD, FACEP  CSN: [id number] MRN: [id number] ARRIVAL: 09/11/19 at 0323 ROOM: WA16/WA16   CHIEF COMPLAINT  Abdominal Pain   HISTORY OF PRESENT ILLNESS  09/11/19 4:19 AM Kathryn Holland is a 33 y.o. female who was seen on August 31, 2019 for left upper quadrant pain, worse after eating.  Work-up showed a mild leukocytosis but no other significant abnormalities.  She was diagnosed with likely gastritis versus peptic ulcer disease.  She states her symptoms are similar to previous peptic ulcer disease.  She was prescribed Pepcid and Carafate for 7 days.  She returns with persistent left upper quadrant pain that worsened yesterday.  She also has had multiple episodes of vomiting since yesterday and feels dehydrated.  Her mouth is dry and she was noted to be tachycardic on arrival.  She rates her pain as a 6 out of 10 and describes it as burning in nature.  She also has a lesser pain in the right lower quadrant.  She has had some diarrhea which she has treated with Pepto-Bismol that subsequently turned her stools dark.   Past Medical History:  Diagnosis Date  . Gallstones   . Kidney stone   . Ovarian cyst   . PUD (peptic ulcer disease)     Past Surgical History:  Procedure Laterality Date  . LITHOTRIPSY      Family History  Problem Relation Age of Onset  . Cancer Mother   . Bipolar disorder Sister   . Anxiety disorder Brother   . ADD / ADHD Brother     Social History   Tobacco Use  . Smoking status: Current Every Day Smoker    Packs/day: 0.50    Types: Cigarettes  . Smokeless tobacco: Never Used  Substance Use Topics  . Alcohol use: Yes    Comment: socially  . Drug use: No    Prior to Admission medications   Medication Sig Start Date End Date Taking? Authorizing Provider  amphetamine-dextroamphetamine (ADDERALL) 20 MG tablet Take 20 mg by mouth 2 (two) times daily.   Yes [provider]  etonogestrel  (NEXPLANON) 68 MG IMPL implant 68 mg by Subdermal route once.  11/28/12  Yes [provider]  venlafaxine XR (EFFEXOR-XR) 75 MG 24 hr capsule Take 75 mg by mouth daily with breakfast.   Yes [provider]  doxepin (SINEQUAN) 25 MG capsule Take 1 capsule (25 mg total) by mouth at bedtime and may repeat dose one time if needed. For depression Patient not taking: Reported on 03/10/2019 03/28/16 09/11/19  Armandina Stammer I, NP  famotidine (PEPCID) 20 MG tablet Take 1 tablet (20 mg total) by mouth 2 (two) times daily for 7 days. 09/01/19 09/11/19  Maxwell Caul, PA-C  loratadine (CLARITIN) 10 MG tablet Take 1 tablet (10 mg total) by mouth daily. For allergies Patient not taking: Reported on 03/10/2019 03/28/16 09/11/19  Armandina Stammer I, NP  sucralfate (CARAFATE) 1 GM/10ML suspension Take 10 mLs (1 g total) by mouth 4 (four) times daily -  with meals and at bedtime. Patient not taking: Reported on 09/11/2019 09/01/19 09/11/19  Maxwell Caul, PA-C    Allergies Stadol [butorphanol tartrate]   REVIEW OF SYSTEMS  Negative except as noted here or in the History of Present Illness.   PHYSICAL EXAMINATION  Initial Vital Signs Blood pressure 118/83, pulse (!) 111, temperature 98.4 F (36.9 C), temperature source Oral, resp. rate 20, height 5\' 8"  (1.727 m), weight 71.7 kg, last  menstrual period 08/14/2019, SpO2 98 %.  Examination General: Well-developed, well-nourished female in no acute distress; appearance consistent with age of record HENT: normocephalic; atraumatic; dry mucous membranes Eyes: pupils equal, round and reactive to light; extraocular muscles intact Neck: supple Heart: regular rate and rhythm; tachycardia Lungs: clear to auscultation bilaterally Abdomen: soft; nondistended; mild left upper quadrant tenderness; no masses or hepatosplenomegaly; bowel sounds present Extremities: No deformity; full range of motion; pulses normal Neurologic: Awake, alert and oriented;  motor function intact in all extremities and symmetric; no facial droop Skin: Warm and dry Psychiatric: Flat affect   RESULTS  Summary of this visit's results, reviewed and interpreted by myself:   EKG Interpretation  Date/Time:    Ventricular Rate:    PR Interval:    QRS Duration:   QT Interval:    QTC Calculation:   R Axis:     Text Interpretation:        Laboratory Studies: Results for orders placed or performed during the hospital encounter of 09/11/19 (from the past 24 hour(s))  Lipase, blood     Status: None   Collection Time: 09/11/19  4:14 AM  Result Value Ref Range   Lipase 26 11 - 51 U/L  Comprehensive metabolic panel     Status: Abnormal   Collection Time: 09/11/19  4:14 AM  Result Value Ref Range   Sodium 132 (L) 135 - 145 mmol/L   Potassium 3.0 (L) 3.5 - 5.1 mmol/L   Chloride 96 (L) 98 - 111 mmol/L   CO2 23 22 - 32 mmol/L   Glucose, Bld 96 70 - 99 mg/dL   BUN 11 6 - 20 mg/dL   Creatinine, Ser 4.090.79 0.44 - 1.00 mg/dL   Calcium 9.2 8.9 - 81.110.3 mg/dL   Total Protein 8.4 (H) 6.5 - 8.1 g/dL   Albumin 4.6 3.5 - 5.0 g/dL   AST 20 15 - 41 U/L   ALT 27 0 - 44 U/L   Alkaline Phosphatase 67 38 - 126 U/L   Total Bilirubin 0.5 0.3 - 1.2 mg/dL   GFR calc non Af Amer >60 >60 mL/min   GFR calc Af Amer >60 >60 mL/min   Anion gap 13 5 - 15  CBC     Status: Abnormal   Collection Time: 09/11/19  4:14 AM  Result Value Ref Range   WBC 15.8 (H) 4.0 - 10.5 K/uL   RBC 4.92 3.87 - 5.11 MIL/uL   Hemoglobin 14.9 12.0 - 15.0 g/dL   HCT 91.443.5 78.236.0 - 95.646.0 %   MCV 88.4 80.0 - 100.0 fL   MCH 30.3 26.0 - 34.0 pg   MCHC 34.3 30.0 - 36.0 g/dL   RDW 21.311.8 08.611.5 - 57.815.5 %   Platelets 479 (H) 150 - 400 K/uL   nRBC 0.0 0.0 - 0.2 %  Urinalysis, Routine w reflex microscopic     Status: Abnormal   Collection Time: 09/11/19  4:14 AM  Result Value Ref Range   Color, Urine YELLOW YELLOW   APPearance CLEAR CLEAR   Specific Gravity, Urine 1.023 1.005 - 1.030   pH 5.0 5.0 - 8.0   Glucose, UA  NEGATIVE NEGATIVE mg/dL   Hgb urine dipstick NEGATIVE NEGATIVE   Bilirubin Urine NEGATIVE NEGATIVE   Ketones, ur 5 (A) NEGATIVE mg/dL   Protein, ur NEGATIVE NEGATIVE mg/dL   Nitrite NEGATIVE NEGATIVE   Leukocytes,Ua TRACE (A) NEGATIVE   RBC / HPF 0-5 0 - 5 RBC/hpf   WBC, UA 0-5 0 -  5 WBC/hpf   Bacteria, UA RARE (A) NONE SEEN   Squamous Epithelial / LPF 6-10 0 - 5   Mucus PRESENT   I-Stat beta hCG blood, ED     Status: None   Collection Time: 09/11/19  4:23 AM  Result Value Ref Range   I-stat hCG, quantitative <5.0 <5 mIU/mL   Comment 3           Imaging Studies: No results found.  ED COURSE and MDM  Nursing notes, initial and subsequent vitals signs, including pulse oximetry, reviewed and interpreted by myself.  Vitals:   09/11/19 0329 09/11/19 0439 09/11/19 0519  BP: 118/83 113/85 109/79  Pulse: (!) 111 (!) 106 (!) 101  Resp: 20 20 17   Temp: 98.4 F (36.9 C)    TempSrc: Oral    SpO2: 98% 98% 98%  Weight: 71.7 kg    Height: 5\' 8"  (1.727 m)     Medications  sodium chloride flush (NS) 0.9 % injection 3 mL (3 mLs Intravenous Given 09/11/19 0422)  ondansetron (ZOFRAN) injection 4 mg (4 mg Intravenous Given 09/11/19 0435)  pantoprazole (PROTONIX) injection 40 mg (40 mg Intravenous Given 09/11/19 0435)  sodium chloride 0.9 % bolus 1,000 mL (0 mLs Intravenous Stopped 09/11/19 0601)  sterile water (preservative free) injection (  Given 09/11/19 0439)  sucralfate (CARAFATE) 1 GM/10ML suspension 1 g (1 g Oral Given 09/11/19 0516)  potassium chloride SA (KLOR-CON) CR tablet 40 mEq (40 mEq Oral Given 09/11/19 0516)   5:58 AM Patient feeling better after IV medications and oral Carafate.  Will place patient on a PPI and Carafate and refer to gastroenterology.  PROCEDURES  Procedures   ED DIAGNOSES     ICD-10-CM   1. Left upper quadrant abdominal pain  R10.12   2. Nausea and vomiting in adult  R11.2   3. Hypokalemia due to loss of potassium  E87.6        Bronco Mcgrory,  MD 09/11/19 0630

## 2019-09-11 NOTE — ED Notes (Signed)
Pt ambulated to restroom without assistance. Gait steady  

## 2019-09-11 NOTE — ED Notes (Signed)
Pt was verbalized discharge instructions. Pt had no further questions at this time. NAD. 

## 2019-09-11 NOTE — ED Triage Notes (Signed)
Pt reports continued LUQ pain. She was seen last week for same. She states that today, however, she has been unable to keep anything down.

## 2020-05-09 IMAGING — DX PORTABLE CHEST - 1 VIEW
1 series · 1 of 1 positions shown · non-contrast
Comparison: Chest radiograph dated 12/30/2018

CLINICAL DATA: 32-year-old female with left upper quadrant
abdominal pain. Evaluate for hernia.

EXAM:
PORTABLE CHEST 1 VIEW

[chest ap]
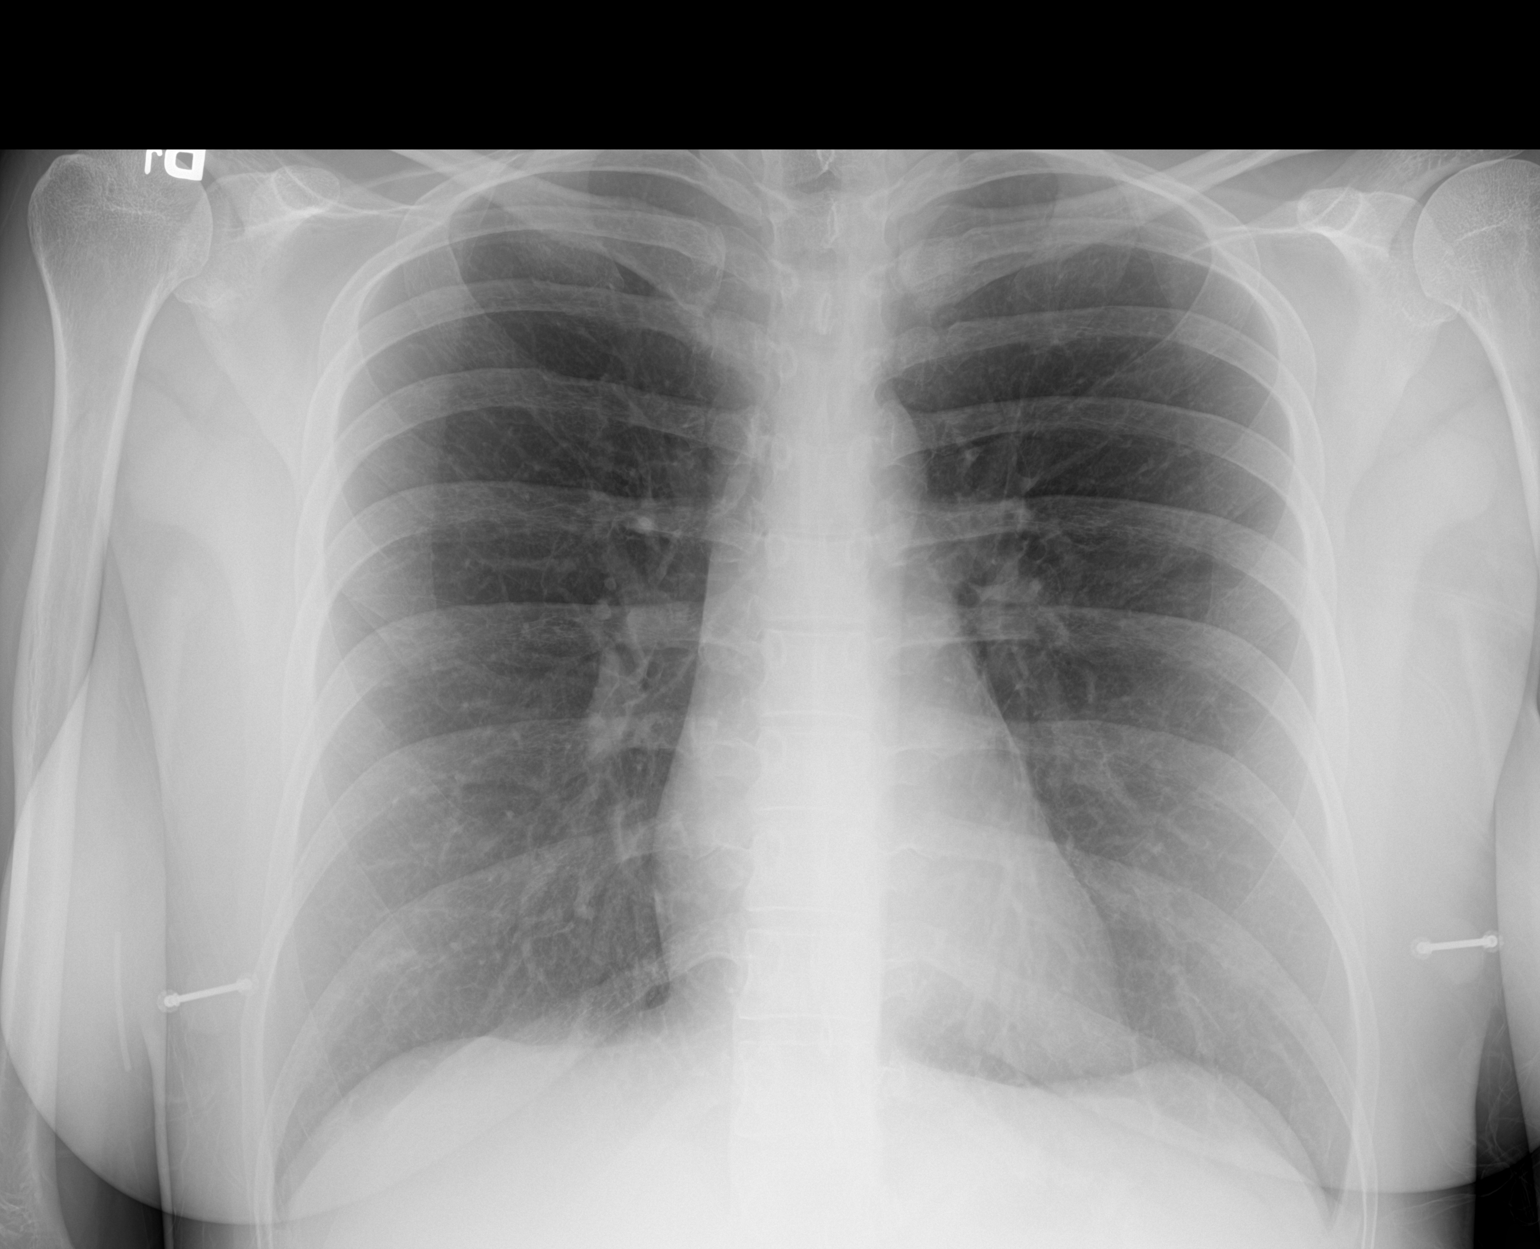

[1 of 1 positions shown; findings below may reference images not displayed]

FINDINGS: The heart size and mediastinal contours are within normal limits.
Both lungs are clear. The visualized skeletal structures are
unremarkable.
IMPRESSION: No active disease.

## 2020-05-20 ENCOUNTER — Encounter (HOSPITAL_COMMUNITY): Payer: Self-pay

## 2020-05-20 ENCOUNTER — Emergency Department (HOSPITAL_COMMUNITY)
Admission: EM | Admit: 2020-05-20 | Discharge: 2020-05-20 | Disposition: A | Discharge status: Other Acute Inpatient Hospital | Payer: Medicaid Other | Attending: Emergency Medicine | Admitting: Emergency Medicine

## 2020-05-20 ENCOUNTER — Other Ambulatory Visit: Payer: Self-pay

## 2020-05-20 ENCOUNTER — Emergency Department (HOSPITAL_COMMUNITY): Payer: Medicaid Other

## 2020-05-20 ENCOUNTER — Inpatient Hospital Stay (HOSPITAL_COMMUNITY)
Admission: AD | Admit: 2020-05-20 | Discharge: 2020-05-26 | DRG: 885 | Disposition: A | Discharge status: Home / Self Care | Payer: Medicaid Other | Source: Intra-hospital | Attending: Psychiatry | Admitting: Psychiatry

## 2020-05-20 DIAGNOSIS — E876 Hypokalemia: Secondary | ICD-10-CM | POA: Diagnosis present

## 2020-05-20 DIAGNOSIS — M797 Fibromyalgia: Secondary | ICD-10-CM | POA: Diagnosis present

## 2020-05-20 DIAGNOSIS — Z8711 Personal history of peptic ulcer disease: Secondary | ICD-10-CM | POA: Diagnosis not present

## 2020-05-20 DIAGNOSIS — K219 Gastro-esophageal reflux disease without esophagitis: Secondary | ICD-10-CM | POA: Diagnosis present

## 2020-05-20 DIAGNOSIS — F1514 Other stimulant abuse with stimulant-induced mood disorder: Secondary | ICD-10-CM | POA: Diagnosis present

## 2020-05-20 DIAGNOSIS — F341 Dysthymic disorder: Secondary | ICD-10-CM | POA: Diagnosis present

## 2020-05-20 DIAGNOSIS — F4310 Post-traumatic stress disorder, unspecified: Secondary | ICD-10-CM | POA: Diagnosis present

## 2020-05-20 DIAGNOSIS — F17210 Nicotine dependence, cigarettes, uncomplicated: Secondary | ICD-10-CM | POA: Insufficient documentation

## 2020-05-20 DIAGNOSIS — R110 Nausea: Secondary | ICD-10-CM | POA: Insufficient documentation

## 2020-05-20 DIAGNOSIS — G2581 Restless legs syndrome: Secondary | ICD-10-CM | POA: Diagnosis present

## 2020-05-20 DIAGNOSIS — R45851 Suicidal ideations: Secondary | ICD-10-CM | POA: Insufficient documentation

## 2020-05-20 DIAGNOSIS — Z915 Personal history of self-harm: Secondary | ICD-10-CM

## 2020-05-20 DIAGNOSIS — F1011 Alcohol abuse, in remission: Secondary | ICD-10-CM | POA: Diagnosis present

## 2020-05-20 DIAGNOSIS — F1594 Other stimulant use, unspecified with stimulant-induced mood disorder: Secondary | ICD-10-CM | POA: Diagnosis not present

## 2020-05-20 DIAGNOSIS — F1910 Other psychoactive substance abuse, uncomplicated: Secondary | ICD-10-CM | POA: Insufficient documentation

## 2020-05-20 DIAGNOSIS — Z20822 Contact with and (suspected) exposure to covid-19: Secondary | ICD-10-CM | POA: Diagnosis present

## 2020-05-20 DIAGNOSIS — F332 Major depressive disorder, recurrent severe without psychotic features: Principal | ICD-10-CM | POA: Diagnosis present

## 2020-05-20 DIAGNOSIS — Z818 Family history of other mental and behavioral disorders: Secondary | ICD-10-CM | POA: Diagnosis not present

## 2020-05-20 DIAGNOSIS — Z62810 Personal history of physical and sexual abuse in childhood: Secondary | ICD-10-CM | POA: Diagnosis present

## 2020-05-20 DIAGNOSIS — R109 Unspecified abdominal pain: Secondary | ICD-10-CM | POA: Insufficient documentation

## 2020-05-20 DIAGNOSIS — F411 Generalized anxiety disorder: Secondary | ICD-10-CM | POA: Insufficient documentation

## 2020-05-20 DIAGNOSIS — Z87442 Personal history of urinary calculi: Secondary | ICD-10-CM | POA: Diagnosis not present

## 2020-05-20 DIAGNOSIS — F419 Anxiety disorder, unspecified: Secondary | ICD-10-CM | POA: Diagnosis present

## 2020-05-20 DIAGNOSIS — Z79899 Other long term (current) drug therapy: Secondary | ICD-10-CM | POA: Diagnosis not present

## 2020-05-20 DIAGNOSIS — R079 Chest pain, unspecified: Secondary | ICD-10-CM | POA: Insufficient documentation

## 2020-05-20 LAB — COMPREHENSIVE METABOLIC PANEL
ALT: 33 U/L (ref 0–44)
AST: 23 U/L (ref 15–41)
Albumin: 4.6 g/dL (ref 3.5–5.0)
Alkaline Phosphatase: 79 U/L (ref 38–126)
Anion gap: 13 (ref 5–15)
BUN: 10 mg/dL (ref 6–20)
CO2: 24 mmol/L (ref 22–32)
Calcium: 9.5 mg/dL (ref 8.9–10.3)
Chloride: 97 mmol/L — ABNORMAL LOW (ref 98–111)
Creatinine, Ser: 0.61 mg/dL (ref 0.44–1.00)
GFR calc Af Amer: >60 mL/min (ref >60)
GFR calc non Af Amer: >60 mL/min (ref >60)
Glucose, Bld: 99 mg/dL (ref 70–99)
Potassium: 3.2 mmol/L — ABNORMAL LOW (ref 3.5–5.1)
Sodium: 134 mmol/L — ABNORMAL LOW (ref 135–145)
Total Bilirubin: 0.6 mg/dL (ref 0.3–1.2)
Total Protein: 8.5 g/dL — ABNORMAL HIGH (ref 6.5–8.1)

## 2020-05-20 LAB — CBC
HCT: 42.9 % (ref 36.0–46.0)
Hemoglobin: 14.9 g/dL (ref 12.0–15.0)
MCH: 29.4 pg (ref 26.0–34.0)
MCHC: 34.7 g/dL (ref 30.0–36.0)
MCV: 84.6 fL (ref 80.0–100.0)
Platelets: 457 10*3/uL — ABNORMAL HIGH (ref 150–400)
RBC: 5.07 MIL/uL (ref 3.87–5.11)
RDW: 11.7 % (ref 11.5–15.5)
WBC: 17.4 10*3/uL — ABNORMAL HIGH (ref 4.0–10.5)
nRBC: 0 % (ref 0.0–0.2)

## 2020-05-20 LAB — TROPONIN I (HIGH SENSITIVITY): Troponin I (High Sensitivity): 2 ng/L (ref <18)

## 2020-05-20 LAB — ETHANOL: Alcohol, Ethyl (B): <10 mg/dL (ref <10)

## 2020-05-20 LAB — I-STAT BETA HCG BLOOD, ED (MC, WL, AP ONLY): I-stat hCG, quantitative: <5 m[IU]/mL (ref <5)

## 2020-05-20 LAB — RAPID URINE DRUG SCREEN, HOSP PERFORMED
Amphetamines: POSITIVE — AB
Barbiturates: NOT DETECTED
Benzodiazepines: NOT DETECTED
Cocaine: NOT DETECTED
Opiates: NOT DETECTED
Tetrahydrocannabinol: NOT DETECTED

## 2020-05-20 LAB — SALICYLATE LEVEL: Salicylate Lvl: <7 mg/dL — ABNORMAL LOW (ref 7.0–30.0)

## 2020-05-20 LAB — ACETAMINOPHEN LEVEL: Acetaminophen (Tylenol), Serum: <10 ug/mL — ABNORMAL LOW (ref 10–30)

## 2020-05-20 MED ORDER — ALUM & MAG HYDROXIDE-SIMETH 200-200-20 MG/5ML PO SUSP: 30.0000 mL | Freq: Once | ORAL | Status: DC

## 2020-05-20 MED ORDER — ONDANSETRON 4 MG PO TBDP
4.0000 mg | ORAL_TABLET | Freq: Once | ORAL | Status: AC
  Administered 2020-05-20: 4 mg via ORAL
  Filled 2020-05-20: qty 1

## 2020-05-20 MED ORDER — ALUM & MAG HYDROXIDE-SIMETH 200-200-20 MG/5ML PO SUSP
30.0000 mL | Freq: Once | ORAL | Status: AC
  Administered 2020-05-20: 30 mL via ORAL
  Filled 2020-05-20: qty 30

## 2020-05-20 MED ORDER — ONDANSETRON HCL 4 MG PO TABS
4.0000 mg | ORAL_TABLET | Freq: Once | ORAL | Status: AC
  Administered 2020-05-20: 4 mg via ORAL
  Filled 2020-05-20: qty 1

## 2020-05-20 MED ORDER — POTASSIUM CHLORIDE 20 MEQ PO PACK
40.0000 meq | PACK | Freq: Once | ORAL | Status: AC
  Administered 2020-05-20: 40 meq via ORAL
  Filled 2020-05-20: qty 2

## 2020-05-20 MED ORDER — LIDOCAINE VISCOUS HCL 2 % MT SOLN
15.0000 mL | Freq: Once | OROMUCOSAL | Status: AC
  Administered 2020-05-20: 15 mL via ORAL
  Filled 2020-05-20: qty 15

## 2020-05-20 NOTE — ED Triage Notes (Signed)
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 Pt reports that she needs help as she has been using too much meth. Pt reports she has been thinking of suicide as she feels very hopeless. Pt reports last meth use was this morning. Pt reports she is feeling pain in her chest.

## 2020-05-20 NOTE — ED Notes (Signed)
Patient wanded by security. 

## 2020-05-20 NOTE — ED Notes (Signed)
[facility] transport called to take pt to [facility]

## 2020-05-20 NOTE — ED Provider Notes (Signed)
[facility]-EMERGENCY DEPT Provider Note   CSN: [id number] Arrival date & time: 05/20/20  1121     History Chief Complaint  Patient presents with   Addiction Problem   Suicidal    Kathryn Holland is a 34 y.o. female.  34 y.o female with a past medical history of addiction prone, suicidal ideation presents to the ED complaining of suicidal ideations and addiction problem.  Patient reports she was taken off her Adderall prescription and venlafaxine, and states after getting taken off this medication she turned down to meth.  She reports daily use, last used was this morning. Patient states she feels "hopeless", "I cannot live like this" and has plans of suicide. She reports no prior plan of SI, no HI, no visual or auditory hallucinations. She also endorses abdominal pain, reports she has been vomiting, unknown when last time she had taken her SSRI.   The history is provided by the patient and medical records.       Past Medical History:  Diagnosis Date   Gallstones    Kidney stone    Ovarian cyst    PUD (peptic ulcer disease)     Patient Active Problem List   Diagnosis Date Noted   Severe recurrent major depression without psychotic features (HCC) 03/23/2016   Alcohol use disorder, severe, dependence (HCC) 03/22/2016    Past Surgical History:  Procedure Laterality Date   LITHOTRIPSY       OB History    Gravida  1   Para      Term      Preterm      AB      Living        SAB      TAB      Ectopic      Multiple      Live Births              Family History  Problem Relation Age of Onset   Cancer Mother    Bipolar disorder Sister    Anxiety disorder Brother    ADD / ADHD Brother     Social History   Tobacco Use   Smoking status: Current Every Day Smoker    Packs/day: 0.50    Types: Cigarettes   Smokeless tobacco: Never Used  Substance Use Topics   Alcohol use: Yes    Comment: socially   Drug use:  No    Home Medications Prior to Admission medications   Medication Sig Start Date End Date Taking? Authorizing Provider  amphetamine-dextroamphetamine (ADDERALL) 20 MG tablet Take 20 mg by mouth 2 (two) times daily.    [provider]  etonogestrel (NEXPLANON) 68 MG IMPL implant 68 mg by Subdermal route once.  11/28/12   [provider]  omeprazole (PRILOSEC) 40 MG capsule Take 1 capsule (40 mg total) by mouth daily. 09/11/19   Molpus, John, MD  potassium chloride SA (KLOR-CON) 20 MEQ tablet Take 1 tablet (20 mEq total) by mouth 2 (two) times daily. 09/11/19   Molpus, John, MD  sucralfate (CARAFATE) 1 g tablet Take 1 tablet (1 g total) by mouth 4 (four) times daily -  with meals and at bedtime. 09/11/19   Molpus, John, MD  venlafaxine XR (EFFEXOR-XR) 75 MG 24 hr capsule Take 75 mg by mouth daily with breakfast.    [provider]  doxepin (SINEQUAN) 25 MG capsule Take 1 capsule (25 mg total) by mouth at bedtime and may repeat dose one time  if needed. For depression Patient not taking: Reported on 03/10/2019 03/28/16 09/11/19  Armandina Stammer I, NP  famotidine (PEPCID) 20 MG tablet Take 1 tablet (20 mg total) by mouth 2 (two) times daily for 7 days. 09/01/19 09/11/19  Maxwell Caul, PA-C  loratadine (CLARITIN) 10 MG tablet Take 1 tablet (10 mg total) by mouth daily. For allergies Patient not taking: Reported on 03/10/2019 03/28/16 09/11/19  Armandina Stammer I, NP    Allergies    Stadol [butorphanol tartrate]  Review of Systems   Review of Systems  Constitutional: Negative for chills and fever.  HENT: Negative for sore throat.   Respiratory: Negative for shortness of breath.   Cardiovascular: Positive for chest pain.  Gastrointestinal: Positive for abdominal pain.  Genitourinary: Negative for flank pain.  Musculoskeletal: Negative for back pain.  Neurological: Negative for light-headedness and headaches.  All other systems reviewed and are negative.   Physical  Exam Updated Vital Signs BP (!) 125/91    Pulse 98    Temp 97.7 F (36.5 C)    Resp 19    LMP 05/13/2020    SpO2 100%   Physical Exam Vitals and nursing note reviewed.  HENT:     Head: Normocephalic and atraumatic.     Nose: Nose normal.  Eyes:     Pupils: Pupils are equal, round, and reactive to light.  Cardiovascular:     Rate and Rhythm: Normal rate.  Pulmonary:     Effort: Pulmonary effort is normal.     Breath sounds: No wheezing or rales.  Abdominal:     General: Abdomen is flat.     Tenderness: There is no abdominal tenderness. There is no left CVA tenderness.  Musculoskeletal:     Cervical back: Normal range of motion and neck supple.  Skin:    General: Skin is warm and dry.  Neurological:     Mental Status: She is alert and oriented to person, place, and time.  Psychiatric:        Attention and Perception: Attention normal.        Mood and Affect: Affect is tearful.        Speech: Speech is delayed.        Behavior: Behavior is slowed.     ED Results / Procedures / Treatments   Labs (all labs ordered are listed, but only abnormal results are displayed) Labs Reviewed  COMPREHENSIVE METABOLIC PANEL - Abnormal; Notable for the following components:      Result Value   Sodium 134 (*)    Potassium 3.2 (*)    Chloride 97 (*)    Total Protein 8.5 (*)    All other components within normal limits  SALICYLATE LEVEL - Abnormal; Notable for the following components:   Salicylate Lvl <7.0 (*)    All other components within normal limits  ACETAMINOPHEN LEVEL - Abnormal; Notable for the following components:   Acetaminophen (Tylenol), Serum <10 (*)    All other components within normal limits  CBC - Abnormal; Notable for the following components:   WBC 17.4 (*)    Platelets 457 (*)    All other components within normal limits  ETHANOL  RAPID URINE DRUG SCREEN, HOSP PERFORMED  I-STAT BETA HCG BLOOD, ED ([facility], [facility], [facility] ONLY)  TROPONIN I (HIGH SENSITIVITY)     EKG None  Radiology DG Chest 2 View  Result Date: 05/20/2020 CLINICAL DATA:  Chest pain EXAM: CHEST - 2 VIEW COMPARISON:  03/09/2019 chest FINDINGS: The heart  size and mediastinal contours are within normal limits. Both lungs are clear. The visualized skeletal structures are unremarkable. IMPRESSION: No active cardiopulmonary disease. Electronically Signed   By: Duanne Guess D.O.   On: 05/20/2020 12:26    Procedures Procedures (including critical care time)  Medications Ordered in ED Medications  ondansetron (ZOFRAN-ODT) disintegrating tablet 4 mg (4 mg Oral Given 05/20/20 1405)    ED Course  I have reviewed the triage vital signs and the nursing notes.  Pertinent labs & imaging results that were available during my care of the patient were reviewed by me and considered in my medical decision making (see chart for details).  Clinical Course as of May 20 2045  Thu May 20, 2020  1643 Amphetamines(!): POSITIVE [JS]    Clinical Course User Index [JS] Claude Manges, PA-C   MDM Rules/Calculators/A&P  Patient with a past medical history of addiction problem, previously placed on Adderall along with duloxetine presents to the ED with complaints of suicidal ideations.  According to patient after having her prescriptions discontinue, she turned onto meth, states her life has been "in shambles ", states "I am hopeless ", patient does report SI, does not have any plan or prior attempt.  No HI, no auditory or visual hallucinations.  Vitals are within normal limits during arrival, she does voice some abdominal pain, some nausea and vomiting, unsure when the last some she took her SSRI and that she is withdrawing from the loxapine at this time.  She did provide [place] with a urine sample, this appeared to be diluted 1 present at the bedside.  Will obtain labs along with further TTS consultation.  2:12 PM has been endorsing some nausea, some suspicion for withdrawals from her and SRI's, last  taken 2 days ago provided with some Zofran at this time.  Interpretation of her labs showed a CMP with some electrolyte derangements however this is very similar to her prior visit.  Creatinine level remained stable.  LFTs are unremarkable.  It appears that she does have a chronic cytosis compared to her previous visits.  Salicylate, acetaminophen, ethanol level are within normal limits.  First troponin is negative, suspect chest pain more so from anxious behavior from post meth use.  No prior CAD history seen on file.  hCG is negative.  She is currently pending a UA.   8:46 PM patient continues to wait on TTS consultation at this time, she was ordered a GI cocktail for her indigestion.    Portions of this note were generated with Scientist, clinical (histocompatibility and immunogenetics). Dictation errors may occur despite best attempts at proofreading.  Final Clinical Impression(s) / ED Diagnoses Final diagnoses:  Polysubstance abuse (HCC)  Suicidal ideation    Rx / DC Orders ED Discharge Orders    None       Freddy Jaksch 05/20/20 2047    Bethann Berkshire, MD 05/23/20 [phone]
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
## 2020-05-20 NOTE — ED Notes (Signed)
Pt completing TTS assessment at this time.

## 2020-05-20 NOTE — ED Notes (Signed)
Patient changed to paper scrubs. Two labeled patient belongings bags at triage nurse's station.

## 2020-05-21 ENCOUNTER — Encounter (HOSPITAL_COMMUNITY): Payer: Self-pay | Admitting: Psychiatry

## 2020-05-21 DIAGNOSIS — F1594 Other stimulant use, unspecified with stimulant-induced mood disorder: Secondary | ICD-10-CM

## 2020-05-21 DIAGNOSIS — F332 Major depressive disorder, recurrent severe without psychotic features: Secondary | ICD-10-CM | POA: Diagnosis not present

## 2020-05-21 LAB — TSH: TSH: 1.987 u[IU]/mL (ref 0.350–4.500)

## 2020-05-21 MED ORDER — HYDROXYZINE HCL 25 MG PO TABS
25.0000 mg | ORAL_TABLET | Freq: Three times a day (TID) | ORAL | Status: DC | PRN
  Administered 2020-05-21 (×2): 25 mg via ORAL
  Filled 2020-05-21 (×2): qty 1

## 2020-05-21 MED ORDER — NICOTINE 21 MG/24HR TD PT24
MEDICATED_PATCH | TRANSDERMAL | Status: AC
  Filled 2020-05-21: qty 1

## 2020-05-21 MED ORDER — PANTOPRAZOLE SODIUM 40 MG PO TBEC
40.0000 mg | DELAYED_RELEASE_TABLET | Freq: Every day | ORAL | Status: DC
  Administered 2020-05-21 – 2020-05-26 (×6): 40 mg via ORAL
  Filled 2020-05-21 (×8): qty 1

## 2020-05-21 MED ORDER — POTASSIUM CHLORIDE CRYS ER 20 MEQ PO TBCR
20.0000 meq | EXTENDED_RELEASE_TABLET | Freq: Two times a day (BID) | ORAL | Status: DC
  Administered 2020-05-21 – 2020-05-23 (×4): 20 meq via ORAL
  Filled 2020-05-21 (×10): qty 1

## 2020-05-21 MED ORDER — TRAZODONE HCL 50 MG PO TABS: 50.0000 mg | ORAL_TABLET | Freq: Every evening | ORAL | Status: DC | PRN

## 2020-05-21 MED ORDER — LORAZEPAM 0.5 MG PO TABS
0.5000 mg | ORAL_TABLET | Freq: Four times a day (QID) | ORAL | Status: DC | PRN
  Administered 2020-05-22 – 2020-05-24 (×5): 0.5 mg via ORAL
  Filled 2020-05-21 (×6): qty 1

## 2020-05-21 MED ORDER — ALUM & MAG HYDROXIDE-SIMETH 200-200-20 MG/5ML PO SUSP
30.0000 mL | ORAL | Status: DC | PRN
  Administered 2020-05-21: 30 mL via ORAL

## 2020-05-21 MED ORDER — NICOTINE 21 MG/24HR TD PT24
21.0000 mg | MEDICATED_PATCH | Freq: Every day | TRANSDERMAL | Status: DC
  Administered 2020-05-21 – 2020-05-26 (×6): 21 mg via TRANSDERMAL
  Filled 2020-05-21 (×6): qty 1

## 2020-05-21 MED ORDER — DULOXETINE HCL 30 MG PO CPEP
30.0000 mg | ORAL_CAPSULE | Freq: Every day | ORAL | Status: DC
  Administered 2020-05-21 – 2020-05-23 (×3): 30 mg via ORAL
  Filled 2020-05-21 (×6): qty 1

## 2020-05-21 MED ORDER — VENLAFAXINE HCL ER 75 MG PO CP24
75.0000 mg | ORAL_CAPSULE | Freq: Once | ORAL | Status: DC
  Filled 2020-05-21 (×2): qty 1

## 2020-05-21 MED ORDER — MAGNESIUM HYDROXIDE 400 MG/5ML PO SUSP: 30.0000 mL | Freq: Every day | ORAL | Status: DC | PRN

## 2020-05-21 MED ORDER — ACETAMINOPHEN 325 MG PO TABS
650.0000 mg | ORAL_TABLET | Freq: Four times a day (QID) | ORAL | Status: DC | PRN
  Administered 2020-05-21 – 2020-05-26 (×10): 650 mg via ORAL
  Filled 2020-05-21 (×10): qty 2

## 2020-05-21 MED ORDER — ONDANSETRON HCL 4 MG PO TABS
4.0000 mg | ORAL_TABLET | Freq: Once | ORAL | Status: AC
  Administered 2020-05-21: 4 mg via ORAL
  Filled 2020-05-21 (×2): qty 1

## 2020-05-21 NOTE — Progress Notes (Signed)
   05/21/20 2000  COVID-19 Daily Checkoff  Have you had a fever (temp > 37.80C/100F)  in the past 24 hours?  No  COVID-19 EXPOSURE  Have you traveled outside the state in the past 14 days? No  Have you been in contact with someone with a confirmed diagnosis of COVID-19 or PUI in the past 14 days without wearing appropriate PPE? No  Have you been living in the same home as a person with confirmed diagnosis of COVID-19 or a PUI (household contact)? No  Have you been diagnosed with COVID-19? No
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
## 2020-05-21 NOTE — Progress Notes (Signed)
Admission Note:  D:33 yr female who presents [facility] in no acute distress for the treatment of SA and Depression. Pt appears flat and depressed. Pt was calm and cooperative with admission process. Pt presents with passive SI and contracts for safety upon admission. Pt denies AVH . Pt stated she was taken off her adderall 3 yrs ago and started using Meth. Pt stated she wants help with getting of the drugs.  Per Assessment: pt presented hopeless and depressed in regards to having an addiction to meth and venlafaxine. Pt reports she has been using venlafaxine for the past 6 years and it was prescribed to her by her PCP and started having heavy withdrawal symptoms when she off this medication for a short period of time and she started using meth later on. Pt reports she uses month only a few times a month but does not like the withdrawal symptoms and she states she is at her breaking break and tired of living this way. Current USD positive for amphetamines.. Pt endorsed current SI with a plan to overdose on her medications . pt states she feels she would try to harm self is she went home today, is seeking inpatient treatment , med management as well as help with overcoming addiction to (meth and venlafaxine), pt states this adduction has affected her physically, mentally/emotionally she reports symptoms of fatigue, nausea, headaches, decreased concentration, increased sleep, weakness and wishes to no longer live in pain.   A:Skin was assessed([place]-RN) and found to be clear of any abnormal marks apart from old bilateral scars on legs, tattoos on both wrists , lower back and chest. PT searched and no contraband found, POC and unit policies explained and understanding verbalized. Consents obtained. Food and fluids offered, and  accepted.    R:Pt had no additional questions or concerns.

## 2020-05-21 NOTE — [facility] Suicide Risk Assessment (Addendum)
[facility] INPATIENT:  Family/Significant Other Suicide Prevention Education  Suicide Prevention Education:  Contact Attempts: Jaquita Folds [phone] (name of family member/significant other) has been identified by the patient as the family member/significant other with whom the patient will be residing, and identified as the person(s) who will aid the patient in the event of a mental health crisis.  With written consent from the patient, two attempts were made to provide suicide prevention education, prior to and/or following the patient's discharge.  We were unsuccessful in providing suicide prevention education.  A suicide education pamphlet was given to the patient to share with family/significant other.  Date and time of first attempt:05/21/20 1501 Date and time of second attempt:  Erin Sons 05/21/2020, 3:01 PM

## 2020-05-21 NOTE — Tx Team (Signed)
Initial Treatment Plan 05/21/2020 1:08 AM Mikal Plane GYI:[id number]    PATIENT STRESSORS: Medication change or noncompliance Substance abuse   PATIENT STRENGTHS: General fund of knowledge Motivation for treatment/growth   PATIENT IDENTIFIED PROBLEMS: SA-amp / effexor  Depression  "coping skills, building confidence"                 DISCHARGE CRITERIA:  Improved stabilization in mood, thinking, and/or behavior Verbal commitment to aftercare and medication compliance  PRELIMINARY DISCHARGE PLAN: Attend PHP/IOP Attend 12-step recovery group  PATIENT/FAMILY INVOLVEMENT: This treatment plan has been presented to and reviewed with the patient, Kemba Hoppes.  The patient and family have been given the opportunity to ask questions and make suggestions.  Delos Haring, RN 05/21/2020, 1:08 AM

## 2020-05-21 NOTE — Tx Team (Cosign Needed)
Interdisciplinary Treatment and Diagnostic Plan Update  05/21/2020 Time of Session: [place] MRN: [id number]  Principal Diagnosis: <principal problem not specified>  Secondary Diagnoses: Active Problems:   MDD (major depressive disorder), recurrent severe, without psychosis ([place])   Current Medications:  Current Facility-Administered Medications  Medication Dose Route Frequency Provider Last Rate Last Admin  . acetaminophen (TYLENOL) tablet 650 mg  650 mg Oral Q6H PRN Anike, Adaku C, NP      . alum & mag hydroxide-simeth (MAALOX/MYLANTA) 200-200-20 MG/5ML suspension 30 mL  30 mL Oral Q4H PRN Anike, Adaku C, NP      . hydrOXYzine (ATARAX/VISTARIL) tablet 25 mg  25 mg Oral TID PRN Anike, Adaku C, NP   25 mg at 05/21/20 0444  . magnesium hydroxide (MILK OF MAGNESIA) suspension 30 mL  30 mL Oral Daily PRN Anike, Adaku C, NP      . traZODone (DESYREL) tablet 50 mg  50 mg Oral QHS PRN Anike, Adaku C, NP       PTA Medications: Medications Prior to Admission  Medication Sig Dispense Refill Last Dose  . amphetamine-dextroamphetamine (ADDERALL) 20 MG tablet Take 20 mg by mouth 2 (two) times daily. (Patient not taking: Reported on 05/20/2020)     . etonogestrel (NEXPLANON) 68 MG IMPL implant 68 mg by Subdermal route once.      . lisdexamfetamine (VYVANSE) 20 MG capsule Take 20 mg by mouth daily.     Marland Kitchen omeprazole (PRILOSEC) 40 MG capsule Take 1 capsule (40 mg total) by mouth daily. (Patient not taking: Reported on 05/20/2020) 30 capsule 0   . potassium chloride SA (KLOR-CON) 20 MEQ tablet Take 1 tablet (20 mEq total) by mouth 2 (two) times daily. (Patient not taking: Reported on 05/20/2020) 10 tablet 0   . sucralfate (CARAFATE) 1 g tablet Take 1 tablet (1 g total) by mouth 4 (four) times daily -  with meals and at bedtime. (Patient not taking: Reported on 05/20/2020) 40 tablet 1   . venlafaxine XR (EFFEXOR-XR) 75 MG 24 hr capsule Take 75 mg by mouth daily with breakfast.       Patient  Stressors: Medication change or noncompliance Substance abuse  Patient Strengths: General fund of knowledge Motivation for treatment/growth  Treatment Modalities: Medication Management, Group therapy, Case management,  1 to 1 session with clinician, Psychoeducation, Recreational therapy.   Physician Treatment Plan for Primary Diagnosis: <principal problem not specified> Long Term Goal(s):     Short Term Goals:    Medication Management: Evaluate patient's response, side effects, and tolerance of medication regimen.  Therapeutic Interventions: 1 to 1 sessions, Unit Group sessions and Medication administration.  Evaluation of Outcomes: Not Met  Physician Treatment Plan for Secondary Diagnosis: Active Problems:   MDD (major depressive disorder), recurrent severe, without psychosis ([place])  Long Term Goal(s):     Short Term Goals:       Medication Management: Evaluate patient's response, side effects, and tolerance of medication regimen.  Therapeutic Interventions: 1 to 1 sessions, Unit Group sessions and Medication administration.  Evaluation of Outcomes: Not Met   RN Treatment Plan for Primary Diagnosis: <principal problem not specified> Long Term Goal(s): Knowledge of disease and therapeutic regimen to maintain health will improve  Short Term Goals: Ability to remain free from injury will improve, Ability to verbalize frustration and anger appropriately will improve, Ability to demonstrate self-control, Ability to participate in decision making will improve, Ability to verbalize feelings will improve, Ability to disclose and discuss suicidal ideas, Ability to  identify and develop effective coping behaviors will improve and Compliance with prescribed medications will improve  Medication Management: RN will administer medications as ordered by provider, will assess and evaluate patient's response and provide education to patient for prescribed medication. RN will report any adverse  and/or side effects to prescribing provider.  Therapeutic Interventions: 1 on 1 counseling sessions, Psychoeducation, Medication administration, Evaluate responses to treatment, Monitor vital signs and CBGs as ordered, Perform/monitor CIWA, COWS, AIMS and Fall Risk screenings as ordered, Perform wound care treatments as ordered.  Evaluation of Outcomes: Not Met   LCSW Treatment Plan for Primary Diagnosis: <principal problem not specified> Long Term Goal(s): Safe transition to appropriate next level of care at discharge, Engage patient in therapeutic group addressing interpersonal concerns.  Short Term Goals: Engage patient in aftercare planning with referrals and resources, Increase social support, Increase ability to appropriately verbalize feelings, Increase emotional regulation, Facilitate acceptance of mental health diagnosis and concerns, Facilitate patient progression through stages of change regarding substance use diagnoses and concerns, Identify triggers associated with mental health/substance abuse issues and Increase skills for wellness and recovery  Therapeutic Interventions: Assess for all discharge needs, 1 to 1 time with Social worker, Explore available resources and support systems, Assess for adequacy in community support network, Educate family and significant other(s) on suicide prevention, Complete Psychosocial Assessment, Interpersonal group therapy.  Evaluation of Outcomes: Not Met   Progress in Treatment: Attending groups: No. Participating in groups: No. Taking medication as prescribed: Yes. Toleration medication: Yes. Family/Significant other contact made: No, will contact:  Degraff,Hank ( Patient understands diagnosis: Yes. Discussing patient identified problems/goals with staff: No. Medical problems stabilized or resolved: Yes. Denies suicidal/homicidal ideation: Yes. Issues/concerns per patient self-inventory: No. Other:   New problem(s) identified: No,  Describe:  no  New Short Term/Long Term Goal(s):  Patient Goals:   Patient did not attend  Discharge Plan or Barriers:   Reason for Continuation of Hospitalization: Aggression Anxiety Delusions  Depression Hallucinations Homicidal ideation [place] Medical Issues Medication stabilization Suicidal ideation Withdrawal symptoms  Estimated Length of Stay:  Attendees: Patient: Did not attend 05/21/2020 10:18 AM  Physician:  05/21/2020 10:18 AM  Nursing:  05/21/2020 10:18 AM  RN Care Manager: 05/21/2020 10:18 AM  Social Worker: Macon Large 05/21/2020 10:18 AM  Recreational Therapist:  05/21/2020 10:18 AM  Other:  05/21/2020 10:18 AM  Other:  05/21/2020 10:18 AM  Other: 05/21/2020 10:18 AM    Scribe for Treatment Team: Bethann Berkshire, [place] 05/21/2020 10:18 AM

## 2020-05-21 NOTE — Progress Notes (Signed)
Pt reported that she is jittery and nervous because she is going through "Effexor withdrawal."  Pt endorsed feeling anxious and depressed.  Pt started on Cymbalta today.  Pt given vistaril for anxiety.  Pt comes out of her room to talk on the phone otherwise, pt has been in bed for most of the day.  RN established rapport with pt and assessed for needs/concerns.  Pt is lying down in bed at this time.  RN will continue to monitor and provide assistance as indicated.

## 2020-05-21 NOTE — Progress Notes (Signed)
   05/21/20 2000  Psych Admission Type (Psych Patients Only)  Admission Status Voluntary  Psychosocial Assessment  Patient Complaints Depression;Malaise;Sadness;Substance abuse  Eye Contact Fair  Facial Expression Sad;Worried  Affect Depressed;Sad  Speech Slow;Logical/coherent  Interaction Assertive;Isolative  Motor Activity Slow  Appearance/Hygiene In hospital gown  Behavior Characteristics Cooperative;Appropriate to situation;Anxious;Calm  Mood Depressed;Anxious;Sad  Thought Process  Coherency WDL  Content WDL  Delusions None reported or observed  Perception WDL  Hallucination None reported or observed  Judgment Poor  Confusion None  Danger to Self  Current suicidal ideation? Denies  Self-Injurious Behavior No self-injurious ideation or behavior indicators observed or expressed   Agreement Not to Harm Self Yes  Description of Agreement verbally contracts for safety  Danger to Others  Danger to Others None reported or observed
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
## 2020-05-21 NOTE — [facility] Suicide Risk Assessment (Signed)
[facility] INPATIENT:  Family/Significant Other Suicide Prevention Education  Suicide Prevention Education:  Education Completed; Jaquita Folds Fiance,  (name of family member/significant other) has been identified by the patient as the family member/significant other with whom the patient will be residing, and identified as the person(s) who will aid the patient in the event of a mental health crisis (suicidal ideations/suicide attempt).  With written consent from the patient, the family member/significant other has been provided the following suicide prevention education, prior to the and/or following the discharge of the patient.  The suicide prevention education provided includes the following:  Suicide risk factors  Suicide prevention and interventions  National Suicide Hotline telephone number  [facility] assessment telephone number  [facility] Emergency Assistance 911  [facility] and/or Residential Mobile Crisis Unit telephone number  Request made of family/significant other to:  Remove weapons (e.g., guns, rifles, knives), all items previously/currently identified as safety concern.    Remove drugs/medications (over-the-counter, prescriptions, illicit drugs), all items previously/currently identified as a safety concern.  The family member/significant other verbalizes understanding of the suicide prevention education information provided.  The family member/significant other agrees to remove the items of safety concern listed above.  Erin Sons 05/21/2020, 3:27 PM

## 2020-05-22 DIAGNOSIS — F332 Major depressive disorder, recurrent severe without psychotic features: Secondary | ICD-10-CM | POA: Diagnosis not present

## 2020-05-22 DIAGNOSIS — F1594 Other stimulant use, unspecified with stimulant-induced mood disorder: Secondary | ICD-10-CM | POA: Diagnosis not present

## 2020-05-22 LAB — CBC WITH DIFFERENTIAL/PLATELET
Abs Immature Granulocytes: 0.04 10*3/uL (ref 0.00–0.07)
Basophils Absolute: 0.1 10*3/uL (ref 0.0–0.1)
Basophils Relative: 1 %
Eosinophils Absolute: 0.6 10*3/uL — ABNORMAL HIGH (ref 0.0–0.5)
Eosinophils Relative: 5 %
HCT: 38.9 % (ref 36.0–46.0)
Hemoglobin: 12.6 g/dL (ref 12.0–15.0)
Immature Granulocytes: 0 %
Lymphocytes Relative: 25 %
Lymphs Abs: 2.7 10*3/uL (ref 0.7–4.0)
MCH: 29.2 pg (ref 26.0–34.0)
MCHC: 32.4 g/dL (ref 30.0–36.0)
MCV: 90 fL (ref 80.0–100.0)
Monocytes Absolute: 0.8 10*3/uL (ref 0.1–1.0)
Monocytes Relative: 7 %
Neutro Abs: 7 10*3/uL (ref 1.7–7.7)
Neutrophils Relative %: 62 %
Platelets: 415 10*3/uL — ABNORMAL HIGH (ref 150–400)
RBC: 4.32 MIL/uL (ref 3.87–5.11)
RDW: 12.2 % (ref 11.5–15.5)
WBC: 11.2 10*3/uL — ABNORMAL HIGH (ref 4.0–10.5)
nRBC: 0 % (ref 0.0–0.2)

## 2020-05-22 MED ORDER — ONDANSETRON HCL 4 MG PO TABS
4.0000 mg | ORAL_TABLET | Freq: Three times a day (TID) | ORAL | Status: DC | PRN
  Administered 2020-05-22 – 2020-05-23 (×2): 4 mg via ORAL
  Filled 2020-05-22 (×2): qty 1

## 2020-05-22 NOTE — [facility] Group Notes (Signed)
Adult Psychoeducational Group Note  Date:  05/22/2020 Time:  12:04 PM  Group Topic/Focus:  Goals Group:   The focus of this group is to help patients establish daily goals to achieve during treatment and discuss how the patient can incorporate goal setting into their daily lives to aide in recovery.  Participation Level:  Did Not Attend   Dione Housekeeper 05/22/2020, 12:04 PM

## 2020-05-22 NOTE — [facility] Group Notes (Signed)
LCSW Group Therapy Note  Date/Time:  05/22/2020   10:00AM-11:00AM  Type of Therapy and Topic:  Group Therapy:  Fears and Unhealthy/Healthy Coping Skills  Participation Level:  Active   Description of Group:  The focus of this group was to discuss some of the prevalent fears that patients experience, and to identify the commonalities among group members.  A fun exercise was used to initiate the discussion, followed by writing on the white board a group-generated list of unhealthy coping and healthy coping techniques to deal with each fear.    Therapeutic Goals: Patient will be able to distinguish between healthy and unhealthy coping skills Patient will identify and describe 3 fears they experience Patient will identify one positive coping strategy for each fear they experience Patient will respond empathetically to peers' statements regarding fears they experience  Summary of Patient Progress:  The patient expressed that one of her main fears is about further health decline because of her health issues.  She appeared to be profoundly depressed, spoke little, but did listen attentively throughout group.  Therapeutic Modalities Cognitive Behavioral Therapy Motivational Interviewing  Ambrose Mantle, LCSW

## 2020-05-22 NOTE — Progress Notes (Signed)
[facility] MD Progress Note  05/22/2020 7:47 AM Kathryn Holland  MRN:  [id number] Subjective:  Patient continues to report subjective symptoms of venlafaxine WDL, states she feels " like spaced out" and " like I'm sweaty". Denies SI. Has tolerated medications well thus far   Objective : Chart reviewed, patient seen 34 year old female, lives with fianc and son.  Presented to ED on 7/22 for depression and suicidal ideations, neurovegetative symptoms.  Reported methamphetamine use several times a week over the last several months.  Also reported being treated with Effexor XR which she had stopped a few days prior to admission and reported feeling she was experiencing symptoms of venlafaxine withdrawal.  Admission UDS was positive for amphetamines.  Today patient presents alert, attentive, oriented x3. She remains depressed and dysphoric in affect.  Denies suicidal ideations. Thus far tolerating medications well. We reviewed options, to include putting her back on Effexor XR and tapering gradually.  Patient has reported she would rather not resume Effexor XR and describes that she feels that being on this medication has been related to methamphetamine use. 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 Gallstones   . Kidney stone   . Ovarian cyst   . PUD (peptic ulcer disease)     Past Surgical History:  Procedure Laterality Date  . LITHOTRIPSY     Family History:  Family History  Problem Relation Age of Onset  . Cancer Mother   . Bipolar disorder Sister   . Anxiety disorder Brother   . ADD / ADHD Brother    Family  Psychiatric  History: Social History:  Social History   Substance and Sexual Activity  Alcohol Use Not Currently   Comment: socially     Social History   Substance and Sexual Activity  Drug Use Yes  . Types: Amphetamines    Social History   Socioeconomic History  . Marital status: Single    Spouse name: Not on file  . Number of children: Not on file  . Years of education: Not on file  . Highest education level: Not on file  Occupational History  . Not on file  Tobacco Use  . Smoking status: Current Every Day Smoker    Packs/day: 1.00    Years: 0.50    Pack years: 0.50    Types: Cigarettes  . Smokeless tobacco: Never Used  Substance and Sexual Activity  . Alcohol use: Not Currently    Comment: socially  . Drug use: Yes    Types: Amphetamines  . Sexual activity: Yes    Birth control/protection: None  Other Topics Concern  . Not on file  Social History Narrative  . Not on file   Social Determinants of Health   Financial Resource Strain:   . Difficulty of Paying Living Expenses:   Food Insecurity:   . Worried About Programme researcher, broadcasting/film/video in the Last Year:   . Barista in the Last Year:   Transportation Needs:   . Freight forwarder (Medical):   Marland Kitchen Lack of Transportation (Non-Medical):   Physical Activity:   . Days of Exercise per Week:   . Minutes of  Exercise per Session:   Stress:   . Feeling of Stress :   Social Connections:   . Frequency of Communication with Friends and Family:   . Frequency of Social Gatherings with Friends and Family:   . Attends Religious Services:   . Active Member of Clubs or Organizations:   . Attends Banker Meetings:   Marland Kitchen Marital Status:    Additional Social History:   Sleep: Improving  Appetite:  Fair  Current Medications: Current Facility-Administered Medications  Medication Dose Route Frequency Provider Last Rate Last Admin  . acetaminophen (TYLENOL) tablet 650 mg  650 mg Oral Q6H PRN Anike,  Adaku C, NP   650 mg at 05/21/20 2014  . alum & mag hydroxide-simeth (MAALOX/MYLANTA) 200-200-20 MG/5ML suspension 30 mL  30 mL Oral Q4H PRN Anike, Adaku C, NP   30 mL at 05/21/20 1052  . DULoxetine (CYMBALTA) DR capsule 30 mg  30 mg Oral Daily Dagar, Anjali, MD   30 mg at 05/21/20 1248  . LORazepam (ATIVAN) tablet 0.5 mg  0.5 mg Oral Q6H PRN Yasseen Salls, Rockey Situ, MD   0.5 mg at 05/22/20 0342  . magnesium hydroxide (MILK OF MAGNESIA) suspension 30 mL  30 mL Oral Daily PRN Anike, Adaku C, NP      . nicotine (NICODERM CQ - dosed in mg/24 hours) patch 21 mg  21 mg Transdermal Daily Woodard Perrell, Rockey Situ, MD   21 mg at 05/21/20 1655  . ondansetron (ZOFRAN) tablet 4 mg  4 mg Oral Q8H PRN Nira Conn A, NP   4 mg at 05/22/20 0358  . pantoprazole (PROTONIX) EC tablet 40 mg  40 mg Oral Daily Dagar, Anjali, MD   40 mg at 05/21/20 1655  . potassium chloride SA (KLOR-CON) CR tablet 20 mEq  20 mEq Oral BID Dagar, Geralynn Rile, MD   20 mEq at 05/21/20 1655    Lab Results:  Results for orders placed or performed during the hospital encounter of 05/20/20 (from the past 48 hour(s))  TSH     Status: None   Collection Time: 05/21/20  6:16 PM  Result Value Ref Range   TSH 1.987 0.350 - 4.500 uIU/mL    Comment: Performed by a 3rd Generation assay with a functional sensitivity of <=0.01 uIU/mL. Performed at [facility], [address] [place], [place] [address]     Blood Alcohol level:  Lab Results  Component Value Date   ETH <10 05/20/2020   ETH 228 (H) 03/22/2016    Metabolic Disorder Labs: Lab Results  Component Value Date   HGBA1C 5.3 03/24/2016   MPG 105 03/24/2016   No results found for: PROLACTIN Lab Results  Component Value Date   CHOL 215 (H) 03/24/2016   TRIG 179 (H) 03/24/2016   HDL 52 03/24/2016   CHOLHDL 4.1 03/24/2016   VLDL 36 03/24/2016   LDLCALC 127 (H) 03/24/2016    Physical Findings: AIMS:  , ,  ,  ,    CIWA:    COWS:     Musculoskeletal: Strength & Muscle  Tone: within normal limits no tremors or diaphoresis are noted.  Does not appear to be in any acute distress. Gait & Station: normal Patient leans: N/A  Psychiatric Specialty Exam: Physical Exam  Review of Systems no chest pain, no shortness of breath, no vomiting  Blood pressure 108/79, pulse 90, temperature 98.4 F (36.9 C), temperature source Oral, resp. rate 16, height 5' 4.96" (1.65 m), weight 67.6 kg, last menstrual  period 05/13/2020, SpO2 100 %.Body mass index is 24.82 kg/m.  General Appearance: Fairly Groomed  Eye Contact:  Fair  Speech:  Normal Rate  Volume:  Normal  Mood:  Depressed and Dysphoric  Affect:  Congruent  Thought Process:  Linear and Descriptions of Associations: Intact  Orientation:  Full (Time, Place, and Person)  Thought Content:  No hallucinations, no delusions  Suicidal Thoughts:  No denies suicidal or self-injurious ideations  Homicidal Thoughts:  No  Memory:  Recent and remote grossly intact  Judgement:  Fair  Insight:  Fair  Psychomotor Activity:  Normal no psychomotor agitation or restlessness  Concentration:  Concentration: Good and Attention Span: Good  Recall:  Good  Fund of Knowledge:  Good  Language:  Good  Akathisia:  Negative  Handed:  Right  AIMS (if indicated):     Assets:  Communication Skills Desire for Improvement Resilience  ADL's:  Intact  Cognition:  WNL  Sleep:  Number of Hours: 6.5   Assessment: 34 year old female, lives with fianc and son.  Presented to ED on 7/22 for depression and suicidal ideations, neurovegetative symptoms.  Reported methamphetamine use several times a week over the last several months.  Also reported being treated with Effexor XR which she had stopped a few days prior to admission and reported feeling she was experiencing symptoms of venlafaxine withdrawal.  Admission UDS was positive for amphetamines  Today patient remains vaguely depressed, dysphoric and affect.  She denies suicidal ideations.  She  denies side effects thus far.  She continues to report subjective symptoms of venlafaxine withdrawal, which she describes as having "brain zaps" and feeling "spacey" .  She presents fully alert and is oriented x3-vitals have remained stable. Treatment Plan Summary: Daily contact with patient to assess and evaluate symptoms and progress in treatment, Medication management, Plan Inpatient treatment and Medications as below Encourage group and milieu participation Encourage efforts to work on sobriety and relapse prevention Continue Cymbalta 30 mg daily for depression and anxiety Continue Ativan 0.5 mg every 6 hours as needed for anxiety Recheck EKG to monitor QTc Treatment team working on disposition planning options  Craige Cotta, MD 05/22/2020, 7:47 AM

## 2020-05-22 NOTE — Progress Notes (Signed)
   05/22/20 2051  COVID-19 Daily Checkoff  Have you had a fever (temp > 37.80C/100F)  in the past 24 hours?  No  If you have had runny nose, nasal congestion, sneezing in the past 24 hours, has it worsened? No  COVID-19 EXPOSURE  Have you traveled outside the state in the past 14 days? No  Have you been in contact with someone with a confirmed diagnosis of COVID-19 or PUI in the past 14 days without wearing appropriate PPE? No  Have you been living in the same home as a person with confirmed diagnosis of COVID-19 or a PUI (household contact)? No  Have you been diagnosed with COVID-19? No

## 2020-05-22 NOTE — Progress Notes (Signed)
   05/22/20 2053  Psych Admission Type (Psych Patients Only)  Admission Status Voluntary  Psychosocial Assessment  Patient Complaints Anxiety  Eye Contact Fair  Facial Expression Flat  Affect Appropriate to circumstance  Speech Logical/coherent  Interaction Minimal  Motor Activity Slow  Appearance/Hygiene Disheveled  Behavior Characteristics Appropriate to situation  Mood Depressed;Anxious  Thought Process  Coherency WDL  Content WDL  Delusions None reported or observed  Perception WDL  Hallucination None reported or observed  Judgment Poor  Confusion None  Danger to Self  Current suicidal ideation? Denies  Self-Injurious Behavior No self-injurious ideation or behavior indicators observed or expressed   Agreement Not to Harm Self Yes  Description of Agreement verbally contracts for safety  Danger to Others  Danger to Others None reported or observed

## 2020-05-22 NOTE — Progress Notes (Signed)
Pt presents with a flat affect, depressed mood, slow, soft speech and appears disheveled. She reports depression 7/10 (10 being worst) and anxiety 9/10 (10 being worst). She denies SI/HI. She reports having withdrawal symptoms from the mood stabilizer and described the symptoms as nausea, sweats and irritability. She reports poor sleep at bedtime but denies having nightmares or racing thoughts.   Medications reviewed with the pt. V/s reviewed. Verbal support provided. 15 minute checks performed for safety.  Pt compliant with taking current medications and denies any medications side effects.

## 2020-05-23 DIAGNOSIS — F332 Major depressive disorder, recurrent severe without psychotic features: Secondary | ICD-10-CM | POA: Diagnosis not present

## 2020-05-23 DIAGNOSIS — F1594 Other stimulant use, unspecified with stimulant-induced mood disorder: Secondary | ICD-10-CM | POA: Diagnosis not present

## 2020-05-23 MED ORDER — VENLAFAXINE HCL ER 75 MG PO CP24
75.0000 mg | ORAL_CAPSULE | ORAL | Status: DC
  Filled 2020-05-23: qty 1

## 2020-05-23 MED ORDER — VENLAFAXINE HCL ER 75 MG PO CP24
75.0000 mg | ORAL_CAPSULE | ORAL | Status: AC
  Administered 2020-05-23: 75 mg via ORAL
  Filled 2020-05-23 (×2): qty 1

## 2020-05-23 MED ORDER — MIRTAZAPINE 7.5 MG PO TABS
7.5000 mg | ORAL_TABLET | Freq: Every day | ORAL | Status: DC
  Administered 2020-05-24 – 2020-05-25 (×2): 7.5 mg via ORAL
  Filled 2020-05-23 (×4): qty 1
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
## 2020-05-23 NOTE — Progress Notes (Signed)
°   05/23/20 2006  COVID-19 Daily Checkoff  Have you had a fever (temp > 37.80C/100F)  in the past 24 hours?  No  If you have had runny nose, nasal congestion, sneezing in the past 24 hours, has it worsened? No  COVID-19 EXPOSURE  Have you traveled outside the state in the past 14 days? No  Have you been in contact with someone with a confirmed diagnosis of COVID-19 or PUI in the past 14 days without wearing appropriate PPE? No  Have you been living in the same home as a person with confirmed diagnosis of COVID-19 or a PUI (household contact)? No  Have you been diagnosed with COVID-19? No

## 2020-05-23 NOTE — Progress Notes (Signed)
Psychoeducational Group Note  Date:  05/23/2020 Time: 2030  Group Topic/Focus:  wrap up group  Participation Level: Did Not Attend  Participation Quality:  Not Applicable  Affect:  Not Applicable  Cognitive:  Not Applicable  Insight:  Not Applicable  Engagement in Group: Not Applicable  Additional Comments:  Pt was notified that group was beginning but remained in bed.   Johann Capers S 05/23/2020, 9:09 PM

## 2020-05-23 NOTE — Progress Notes (Signed)
   05/23/20 2008  Psych Admission Type (Psych Patients Only)  Admission Status Voluntary  Psychosocial Assessment  Patient Complaints Anxiety;Substance abuse  Eye Contact Fair  Facial Expression Pained  Affect Appropriate to circumstance  Speech Logical/coherent  Interaction Assertive  Motor Activity Slow  Appearance/Hygiene Disheveled  Behavior Characteristics Appropriate to situation  Mood Depressed  Thought Process  Coherency WDL  Content WDL  Delusions None reported or observed  Perception WDL  Hallucination None reported or observed  Judgment Impaired  Confusion None  Danger to Self  Current suicidal ideation? Denies  Self-Injurious Behavior No self-injurious ideation or behavior indicators observed or expressed   Agreement Not to Harm Self Yes  Description of Agreement verbally contracts for safety  Danger to Others  Danger to Others None reported or observed

## 2020-05-23 NOTE — Progress Notes (Signed)
[facility] MD Progress Note  05/23/2020 12:44 PM Kathryn Holland  MRN:  [id number]   Subjective: Patient states she feels horrible because of continued subjective symptoms of venlafaxine withdrawal. She adds she feels " like spaced out" and " like I'm sweaty" and nausea, nightmares, bad nerve pain. She states she did not sleep well last night, spent all night being sweaty, turning & tossing in bed, vivid dreams but denies any PTSD. She states her mood is better overall 8/10. She states she has anxiety 5/10 but medication helps. She states her appetite is good. She talks about her conversation with her fiance that he is going to come back home with her son to stay with her after discharge and she is happy about it.She denies suicidal ideation. She denies any AVH or HI.  Objective : Patient is seen and examined. She is anxious on approach but feels better as the conversation progresses. She is alert, cooperative, pleasant and oriented * 4. She looks sullen but smiles at times. She is only complaining about her withdrawal symptoms from Effexor ER and wants to restart and taper it. She denies any side effects from medication. She does not seems like responding to the internal stimuli and shoes no self injurious behavior on the unit. Her blood pressure 111/83, pulse 110, temperature 98.6 F (37 C). EKG reports from yesterday are normal. Normal  QTc 453 ms.   Principal Problem: <principal problem not specified> Diagnosis: Active Problems:   MDD (major depressive disorder), recurrent severe, without psychosis (HCC)   Amphetamine-induced mood disorder (HCC)  Total Time spent with patient: 25 minutes  Past Psychiatric History: See H & P  Past Medical History:  Past Medical History:  Diagnosis Date  . Gallstones   . Kidney stone   . Ovarian cyst   . PUD (peptic ulcer disease)     Past Surgical History:  Procedure Laterality Date  . LITHOTRIPSY     Family History:  Family History  Problem Relation  Age of Onset  . Cancer Mother   . Bipolar disorder Sister   . Anxiety disorder Brother   . ADD / ADHD Brother    Family Psychiatric  History: See H & P Social History:  Social History   Substance and Sexual Activity  Alcohol Use Not Currently   Comment: socially     Social History   Substance and Sexual Activity  Drug Use Yes  . Types: Amphetamines    Social History   Socioeconomic History  . Marital status: Single    Spouse name: Not on file  . Number of children: Not on file  . Years of education: Not on file  . Highest education level: Not on file  Occupational History  . Not on file  Tobacco Use  . Smoking status: Current Every Day Smoker    Packs/day: 1.00    Years: 0.50    Pack years: 0.50    Types: Cigarettes  . Smokeless tobacco: Never Used  Substance and Sexual Activity  . Alcohol use: Not Currently    Comment: socially  . Drug use: Yes    Types: Amphetamines  . Sexual activity: Yes    Birth control/protection: None  Other Topics Concern  . Not on file  Social History Narrative  . Not on file   Social Determinants of Health   Financial Resource Strain:   . Difficulty of Paying Living Expenses:   Food Insecurity:   . Worried About Programme researcher, broadcasting/film/video in the Last  Year:   . Ran Out of Food in the Last Year:   Transportation Needs:   . Freight forwarder (Medical):   Marland Kitchen Lack of Transportation (Non-Medical):   Physical Activity:   . Days of Exercise per Week:   . Minutes of Exercise per Session:   Stress:   . Feeling of Stress :   Social Connections:   . Frequency of Communication with Friends and Family:   . Frequency of Social Gatherings with Friends and Family:   . Attends Religious Services:   . Active Member of Clubs or Organizations:   . Attends Banker Meetings:   Marland Kitchen Marital Status:    Additional Social History:                         Sleep: Fair  Appetite:  Good  Current Medications: Current  Facility-Administered Medications  Medication Dose Route Frequency Provider Last Rate Last Admin  . acetaminophen (TYLENOL) tablet 650 mg  650 mg Oral Q6H PRN Anike, Adaku C, NP   650 mg at 05/23/20 0650  . alum & mag hydroxide-simeth (MAALOX/MYLANTA) 200-200-20 MG/5ML suspension 30 mL  30 mL Oral Q4H PRN Anike, Adaku C, NP   30 mL at 05/21/20 1052  . DULoxetine (CYMBALTA) DR capsule 30 mg  30 mg Oral Daily Jair Lindblad, MD   30 mg at 05/23/20 0751  . LORazepam (ATIVAN) tablet 0.5 mg  0.5 mg Oral Q6H PRN Cobos, Rockey Situ, MD   0.5 mg at 05/23/20 0750  . magnesium hydroxide (MILK OF MAGNESIA) suspension 30 mL  30 mL Oral Daily PRN Anike, Adaku C, NP      . nicotine (NICODERM CQ - dosed in mg/24 hours) patch 21 mg  21 mg Transdermal Daily Cobos, Rockey Situ, MD   21 mg at 05/23/20 0752  . ondansetron (ZOFRAN) tablet 4 mg  4 mg Oral Q8H PRN Nira Conn A, NP   4 mg at 05/23/20 0750  . pantoprazole (PROTONIX) EC tablet 40 mg  40 mg Oral Daily Yanitza Shvartsman, MD   40 mg at 05/23/20 0751  . potassium chloride SA (KLOR-CON) CR tablet 20 mEq  20 mEq Oral BID Bret Vanessen, Geralynn Rile, MD   20 mEq at 05/23/20 6503    Lab Results:  Results for orders placed or performed during the hospital encounter of 05/20/20 (from the past 48 hour(s))  TSH     Status: None   Collection Time: 05/21/20  6:16 PM  Result Value Ref Range   TSH 1.987 0.350 - 4.500 uIU/mL    Comment: Performed by a 3rd Generation assay with a functional sensitivity of <=0.01 uIU/mL. Performed at [facility], [address], [place], [place] [address]   CBC with Differential/Platelet     Status: Abnormal   Collection Time: 05/22/20  7:31 AM  Result Value Ref Range   WBC 11.2 (H) 4.0 - 10.5 K/uL   RBC 4.32 3.87 - 5.11 MIL/uL   Hemoglobin 12.6 12.0 - 15.0 g/dL   HCT 81.2 36 - 46 %   MCV 90.0 80.0 - 100.0 fL   MCH 29.2 26.0 - 34.0 pg   MCHC 32.4 30.0 - 36.0 g/dL   RDW 75.1 70.0 - 17.4 %   Platelets 415 (H) 150 - 400 K/uL    nRBC 0.0 0.0 - 0.2 %   Neutrophils Relative % 62 %   Neutro Abs 7.0 1.7 - 7.7 K/uL   Lymphocytes Relative  25 %   Lymphs Abs 2.7 0.7 - 4.0 K/uL   Monocytes Relative 7 %   Monocytes Absolute 0.8 0 - 1 K/uL   Eosinophils Relative 5 %   Eosinophils Absolute 0.6 (H) 0 - 0 K/uL   Basophils Relative 1 %   Basophils Absolute 0.1 0 - 0 K/uL   Immature Granulocytes 0 %   Abs Immature Granulocytes 0.04 0.00 - 0.07 K/uL    Comment: Performed at [facility], [address] [place], [place] [id number]    Blood Alcohol level:  Lab Results  Component Value Date   ETH <10 05/20/2020   ETH 228 (H) 03/22/2016    Metabolic Disorder Labs: Lab Results  Component Value Date   HGBA1C 5.3 03/24/2016   MPG 105 03/24/2016   No results found for: PROLACTIN Lab Results  Component Value Date   CHOL 215 (H) 03/24/2016   TRIG 179 (H) 03/24/2016   HDL 52 03/24/2016   CHOLHDL 4.1 03/24/2016   VLDL 36 03/24/2016   LDLCALC 127 (H) 03/24/2016    Physical Findings: AIMS: Facial and Oral Movements Muscles of Facial Expression: None, normal Lips and Perioral Area: None, normal Jaw: None, normal Tongue: None, normal,Extremity Movements Upper (arms, wrists, hands, fingers): None, normal Lower (legs, knees, ankles, toes): None, normal, Trunk Movements Neck, shoulders, hips: None, normal, Overall Severity Severity of abnormal movements (highest score from questions above): None, normal Incapacitation due to abnormal movements: None, normal Patient's awareness of abnormal movements (rate only patient's report): No Awareness, Dental Status Current problems with teeth and/or dentures?: No Does patient usually wear dentures?: No  CIWA:    COWS:     Musculoskeletal: Strength & Muscle Tone: within normal limits Gait & Station: normal Patient leans: N/A  Psychiatric Specialty Exam: Physical Exam Neurological:     General: No focal deficit present.     Mental Status: She is oriented  to person, place, and time.  Psychiatric:        Behavior: Behavior normal.        Thought Content: Thought content normal.     Review of Systems  Blood pressure 111/83, pulse (!) 110, temperature 98.6 F (37 C), temperature source Oral, resp. rate 16, height 5' 4.96" (1.65 m), weight 67.6 kg, last menstrual period 05/13/2020, SpO2 98 %.Body mass index is 24.82 kg/m.  General Appearance: Disheveled  Eye Contact:  Fair  Speech:  Slow  Volume:  Decreased  Mood:  Anxious and Irritable  Affect:  Labile  Thought Process:  Coherent  Orientation:  Full (Time, Place, and Person)  Thought Content:  WDL  Suicidal Thoughts:  No  Homicidal Thoughts:  No  Memory:  Immediate;   Good Recent;   Good  Judgement:  Fair  Insight:  Fair  Psychomotor Activity:  Normal  Concentration:  Concentration: Good and Attention Span: Good  Recall:  Good  Fund of Knowledge:  Fair  Language:  Good  Akathisia:  No  Handed:  Right  AIMS (if indicated):     Assets:  Communication Skills Desire for Improvement Resilience Social Support  ADL's:  Intact  Cognition:  WNL  Sleep:  Number of Hours: 6.5   Assessment: Patient is a 34 yo women who was living alone presented to ER with suicidal ideations and depressed mood. She admits using methamphetamine from last 3 years on regular basis and feels hopeless, depressed, has no energy, fatigued, low self esteem, difficulty sleeping at night and has suicidal ideations which points  toward substance induced mood disorder. She also has past history of recurrent episodes of major depressive disorder and was taking venlafaxine from last 6 years.  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  2. Major depressive disorder-  feels hopeless, depressed, has no energy, fatigued,  low self esteem, difficulty sleeping at night and has suicidal ideations .  3.Dysthymia- patient complains that she has always been depressed and it began when she was in her 39's.  Patient's mood is better today, looks anxious but most disturbed about disturbed sleep due to withdrawl symptoms.   Treatment Plan Summary: Daily contact with patient to assess and evaluate symptoms and progress in treatment.  Plan:  1. Start Effexor XR 75 mg 24 hr capsule for mood stabilization and anxiety.- Taper it off gradually to help with patient withdrawal symptoms. 2. Continue duloxetine ( Cymbalta) 30 mg Q day PO for depressed mood. 3. Start Pantoprazole 40 mg for her acid reflux symptoms. 4. Continue Ativan 0.5 mg PO Q6H PRN for anxiety. 5. Encouragement for group therapy.  6. Disposition in progress.   Arnoldo Lenis, MD 05/23/2020, 12:44 PM

## 2020-05-23 NOTE — Progress Notes (Signed)
   05/23/20 0800  Psych Admission Type (Psych Patients Only)  Admission Status Voluntary  Psychosocial Assessment  Patient Complaints Anxiety;Depression  Eye Contact Fair  Facial Expression Flat  Affect Appropriate to circumstance  Speech Logical/coherent  Interaction Minimal  Motor Activity Slow  Appearance/Hygiene Disheveled  Behavior Characteristics Appropriate to situation  Mood Depressed;Anxious  Thought Process  Coherency WDL  Content WDL  Delusions None reported or observed  Perception WDL  Hallucination None reported or observed  Judgment Poor  Confusion None  Danger to Self  Current suicidal ideation? Denies  Self-Injurious Behavior No self-injurious ideation or behavior indicators observed or expressed   Danger to Others  Danger to Others None reported or observed

## 2020-05-24 DIAGNOSIS — F332 Major depressive disorder, recurrent severe without psychotic features: Secondary | ICD-10-CM | POA: Diagnosis not present

## 2020-05-24 DIAGNOSIS — F1594 Other stimulant use, unspecified with stimulant-induced mood disorder: Secondary | ICD-10-CM | POA: Diagnosis not present

## 2020-05-24 LAB — BASIC METABOLIC PANEL
Anion gap: 9 (ref 5–15)
BUN: 13 mg/dL (ref 6–20)
CO2: 26 mmol/L (ref 22–32)
Calcium: 8.9 mg/dL (ref 8.9–10.3)
Chloride: 102 mmol/L (ref 98–111)
Creatinine, Ser: 0.57 mg/dL (ref 0.44–1.00)
GFR calc Af Amer: >60 mL/min (ref >60)
GFR calc non Af Amer: >60 mL/min (ref >60)
Glucose, Bld: 88 mg/dL (ref 70–99)
Potassium: 4 mmol/L (ref 3.5–5.1)
Sodium: 137 mmol/L (ref 135–145)

## 2020-05-24 MED ORDER — DOCUSATE SODIUM 100 MG PO CAPS
100.0000 mg | ORAL_CAPSULE | Freq: Two times a day (BID) | ORAL | Status: DC | PRN
  Administered 2020-05-24: 100 mg via ORAL
  Filled 2020-05-24: qty 1

## 2020-05-24 MED ORDER — VENLAFAXINE HCL ER 75 MG PO CP24
75.0000 mg | ORAL_CAPSULE | Freq: Every day | ORAL | Status: DC
  Administered 2020-05-25 – 2020-05-26 (×2): 75 mg via ORAL
  Filled 2020-05-24 (×3): qty 1

## 2020-05-24 MED ORDER — BISACODYL 5 MG PO TBEC: 5.0000 mg | DELAYED_RELEASE_TABLET | Freq: Every day | ORAL | Status: DC | PRN

## 2020-05-24 NOTE — Progress Notes (Signed)
Patient rated her day as a 6 out of a possible 10 since it was "boring" to her. Her goal for tomorrow is to try and contact her family about her discharge plans.

## 2020-05-24 NOTE — Progress Notes (Signed)
Pt is alert and oriented to person, place, time and situation. Pt is calm, cooperative, denies suicidal and homicidal ideation, denies hallucinations. Pt is pleasant, medication compliant. Pt reports that she feels tired, and depressed. Pt reports, "Im waiting for my medications to balance me out." Pt's affect is flat, eye contact is fair. Pt rates her anxiety zero on a 0-10 scale, 10 being worst, and depression is an 8/10 on a 0-10 scale, 10 being worst. No distress noted, none reported. Will continue to monitor pt per Q15 minute face checks and monitor for safety and progress.

## 2020-05-24 NOTE — Progress Notes (Signed)
Recreation Therapy Notes  Date:  7.26.21 Time: 0930 Location: [address] Dayroom  Group Topic: Stress Management  Goal Area(s) Addresses:  Patient will identify positive stress management techniques. Patient will identify benefits of using stress management post d/c.  Intervention: Stress Management  Activity: Guided Imagery.  LRT read a script that took patients on a journey on the the beach to hear the peaceful waves.  Patients were to listen and follow as script was read to fully engage in activity.    Education:  Stress Management, Discharge Planning.   Education Outcome: Acknowledges Education  Clinical Observations/Feedback: Pt did not attend group session.    Caroll Rancher, LRT/CTRS         Lillia Abed, Berlynn Warsame A 05/24/2020 11:45 AM

## 2020-05-24 NOTE — [facility] Group Notes (Signed)
Occupational Therapy Group Note Date: 05/24/2020 Group Topic/Focus: Stress Management  Group Description: Group encouraged increased participation and engagement through discussion focused on topic of stress management. Patients engaged interactively to discuss components of stress including physical signs, emotional signs, negative management strategies, and positive management strategies. Each individual identified one new stress management strategy they would like to try moving forward.   Participation Level: Active   Participation Quality: Independent   Behavior: Cooperative and Interactive   Speech/Thought Process: Focused   Affect/Mood: Euthymic   Insight: Moderate   Judgement: Moderate   Individualization: Kathryn Holland was active and independent in her participation of discussion. Pt identified "research" as an effective stress management strategy, though recognized "that takes a lot of effort". Pt identified "art, something creative" as an alternative strategy she could use when she does not have the energy or effort to research something she is interested in. Post group, pt expressed interest in obtaining an individual or group therapist - encouraged to speak and follow up with SW; pt receptive.   Modes of Intervention: Discussion, Education and Socialization  Patient Response to Interventions:  Attentive, Engaged, Receptive and Interested   Plan: Continue to engage patient in OT groups 2 - 3x/week.  Donne Hazel, MOT, OTR/L

## 2020-05-24 NOTE — Progress Notes (Signed)
[facility] MD Progress Note  05/24/2020 11:08 AM Kathryn Holland  MRN:  [id number]   Subjective:  Patient states "I feel groggy,horrible, depressed" because of Effexor that we started yesterday. She states her sleep was better last night, there was no sweating, turning & tossing in bed, vivid dreams but did not feel like herself and too much sedated.. She states her mood is irritable and she just wants to stop this medication. She states her appetite is good. She talks about her conversation with her fiance last evening and states she was not able to stand for phone conversation and went to bed.She denies suicidal ideation. She denies any AVH or HI.  Objective :Patient is seen and examined. She looks sullen on approach but feels better as the conversation progresses. She looks tired but cooperative, pleasant and oriented * 4. She smiles at times. She is complaining about  Effexor ER and wants to stop it but when explained about tapering it agrees with the plan. She denies any other side effects from medication. She does not seems like responding to the internal stimuli and shoes no self injurious behavior on the unit. Her blood pressure is 112/79, pulse 105, temperature 98.4 F (36.9 C). Her Basic metabolic panel from today is normal.  Principal Problem: <principal problem not specified> Diagnosis: Active Problems:   MDD (major depressive disorder), recurrent severe, without psychosis (HCC)   Amphetamine-induced mood disorder (HCC)  Total Time spent with patient: 25 minutes  Past Psychiatric History: See H & P  Past Medical History:  Past Medical History:  Diagnosis Date  . Gallstones   . Kidney stone   . Ovarian cyst   . PUD (peptic ulcer disease)     Past Surgical History:  Procedure Laterality Date  . LITHOTRIPSY     Family History:  Family History  Problem Relation Age of Onset  . Cancer Mother   . Bipolar disorder Sister   . Anxiety disorder Brother   . ADD / ADHD Brother     Family Psychiatric  History: See H & P Social History:  Social History   Substance and Sexual Activity  Alcohol Use Not Currently   Comment: socially     Social History   Substance and Sexual Activity  Drug Use Yes  . Types: Amphetamines    Social History   Socioeconomic History  . Marital status: Single    Spouse name: Not on file  . Number of children: Not on file  . Years of education: Not on file  . Highest education level: Not on file  Occupational History  . Not on file  Tobacco Use  . Smoking status: Current Every Day Smoker    Packs/day: 1.00    Years: 0.50    Pack years: 0.50    Types: Cigarettes  . Smokeless tobacco: Never Used  Substance and Sexual Activity  . Alcohol use: Not Currently    Comment: socially  . Drug use: Yes    Types: Amphetamines  . Sexual activity: Yes    Birth control/protection: None  Other Topics Concern  . Not on file  Social History Narrative  . Not on file   Social Determinants of Health   Financial Resource Strain:   . Difficulty of Paying Living Expenses:   Food Insecurity:   . Worried About Programme researcher, broadcasting/film/video in the Last Year:   . Barista in the Last Year:   Transportation Needs:   . Freight forwarder (Medical):   Marland Kitchen  Lack of Transportation (Non-Medical):   Physical Activity:   . Days of Exercise per Week:   . Minutes of Exercise per Session:   Stress:   . Feeling of Stress :   Social Connections:   . Frequency of Communication with Friends and Family:   . Frequency of Social Gatherings with Friends and Family:   . Attends Religious Services:   . Active Member of Clubs or Organizations:   . Attends Banker Meetings:   Marland Kitchen Marital Status:    Additional Social History:                         Sleep: Fair  Appetite:  Good  Current Medications: Current Facility-Administered Medications  Medication Dose Route Frequency Provider Last Rate Last Admin  . acetaminophen  (TYLENOL) tablet 650 mg  650 mg Oral Q6H PRN Anike, Adaku C, NP   650 mg at 05/24/20 0146  . LORazepam (ATIVAN) tablet 0.5 mg  0.5 mg Oral Q6H PRN Cobos, Rockey Situ, MD   0.5 mg at 05/24/20 0145  . mirtazapine (REMERON) tablet 7.5 mg  7.5 mg Oral QHS Cobos, Fernando A, MD      . nicotine (NICODERM CQ - dosed in mg/24 hours) patch 21 mg  21 mg Transdermal Daily Cobos, Rockey Situ, MD   21 mg at 05/24/20 0998  . pantoprazole (PROTONIX) EC tablet 40 mg  40 mg Oral Daily Nariyah Osias, Geralynn Rile, MD   40 mg at 05/24/20 3382    Lab Results:  Results for orders placed or performed during the hospital encounter of 05/20/20 (from the past 48 hour(s))  Basic metabolic panel     Status: None   Collection Time: 05/24/20  6:54 AM  Result Value Ref Range   Sodium 137 135 - 145 mmol/L   Potassium 4.0 3.5 - 5.1 mmol/L   Chloride 102 98 - 111 mmol/L   CO2 26 22 - 32 mmol/L   Glucose, Bld 88 70 - 99 mg/dL    Comment: Glucose reference range applies only to samples taken after fasting for at least 8 hours.   BUN 13 6 - 20 mg/dL   Creatinine, Ser 5.05 0.44 - 1.00 mg/dL   Calcium 8.9 8.9 - 39.7 mg/dL   GFR calc non Af Amer >60 >60 mL/min   GFR calc Af Amer >60 >60 mL/min   Anion gap 9 5 - 15    Comment: Performed at [facility], [address], [place], [place] [address]    Blood Alcohol level:  Lab Results  Component Value Date   ETH <10 05/20/2020   ETH 228 (H) 03/22/2016    Metabolic Disorder Labs: Lab Results  Component Value Date   HGBA1C 5.3 03/24/2016   MPG 105 03/24/2016   No results found for: PROLACTIN Lab Results  Component Value Date   CHOL 215 (H) 03/24/2016   TRIG 179 (H) 03/24/2016   HDL 52 03/24/2016   CHOLHDL 4.1 03/24/2016   VLDL 36 03/24/2016   LDLCALC 127 (H) 03/24/2016    Physical Findings: AIMS: Facial and Oral Movements Muscles of Facial Expression: None, normal Lips and Perioral Area: None, normal Jaw: None, normal Tongue: None, normal,Extremity  Movements Upper (arms, wrists, hands, fingers): None, normal Lower (legs, knees, ankles, toes): None, normal, Trunk Movements Neck, shoulders, hips: None, normal, Overall Severity Severity of abnormal movements (highest score from questions above): None, normal Incapacitation due to abnormal movements: None, normal Patient's awareness  of abnormal movements (rate only patient's report): No Awareness, Dental Status Current problems with teeth and/or dentures?: No Does patient usually wear dentures?: No  CIWA:    COWS:     Musculoskeletal: Strength & Muscle Tone: within normal limits Gait & Station: normal Patient leans: N/A  Psychiatric Specialty Exam: Physical Exam Psychiatric:        Thought Content: Thought content normal.     Review of Systems  Blood pressure 112/79, pulse 105, temperature 98.4 F (36.9 C), temperature source Oral, resp. rate 18, height 5' 4.96" (1.65 m), weight 67.6 kg, last menstrual period 05/13/2020, SpO2 99 %.Body mass index is 24.82 kg/m.  General Appearance: Disheveled  Eye Contact:  Fair  Speech:  Slow  Volume:  Decreased  Mood:  Anxious, Depressed, Hopeless and Irritable  Affect:  Depressed  Thought Process:  Goal Directed  Orientation:  Full (Time, Place, and Person)  Thought Content:  WDL  Suicidal Thoughts:  No  Homicidal Thoughts:  No  Memory:  Immediate;   Good Recent;   Fair  Judgement:  Fair  Insight:  Fair  Psychomotor Activity:  Normal  Concentration:  Concentration: Good and Attention Span: Good  Recall:  Good  Fund of Knowledge:  Fair  Language:  Good  Akathisia:  No  Handed:  Right  AIMS (if indicated):     Assets:  Communication Skills Desire for Improvement Resilience Social Support  ADL's:  Intact  Cognition:  WNL  Sleep:  Number of Hours: 5.25    Assessment: Patient is a 34 yo women who was livingalonepresented to ER with suicidal ideations and depressed mood. She admits using methamphetamine from last 3 years on  regular basis andfeels hopeless, depressed, has no energy, fatigued, low self esteem, difficulty sleeping at night and has suicidal ideationswhich points toward substance induced mood disorder. She also has past history of recurrent episodes of major depressive disorder and was taking venlafaxine from last 6 years.  D/D:  1. 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  2. Major depressive disorder-feels hopeless, depressed, has no energy, fatigued, low self esteem, difficulty sleeping at night and has suicidal ideations.  3.Dysthymia- patient complains that she has always been depressed and it began when she was in her 64's.  Patient is irritable, looks anxious and disturbed about feeling groggy.   Treatment Plan Summary: Daily contact with patient to assess and evaluate symptoms and progress in treatment.  Plan:  1. Continue Effexor XR 75 mg 24 hr capsule for mood stabilization and anxiety.- Taper it off gradually to help with patient withdrawal symptoms. 2. Continue duloxetine ( Cymbalta) 30 mg Q day PO for depressed mood. 3. Start Pantoprazole 40 mg for her acid reflux symptoms. 4. Continue Ativan 0.5 mg PO Q6H PRN for anxiety. 5. May stop Remeron if not alert.  6. Encouragement for group therapy. 7. Disposition in progress.   Arnoldo Lenis, MD 05/24/2020, 11:08 AM

## 2020-05-25 DIAGNOSIS — F1594 Other stimulant use, unspecified with stimulant-induced mood disorder: Secondary | ICD-10-CM | POA: Diagnosis not present

## 2020-05-25 DIAGNOSIS — F332 Major depressive disorder, recurrent severe without psychotic features: Secondary | ICD-10-CM | POA: Diagnosis not present

## 2020-05-25 NOTE — Progress Notes (Signed)
Pt has an improved mood and is brighter upon interactions. She denies any withdrawal symptoms, but does c/o feeling tired. At the beginning of the shift, she was seen resting in her bed as a result. 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 Pt did refuse her scheduled remeron at bedtime at first, but after education about the medication was reinforced pt took it willingly. She denies SI/HI and AVH. Medications administered as ordered by MD. No adverse effects noted. Active listening, reassurance, and support provided. Q 15 min safety checks continue. Pt's safety has been maintained.    05/25/20 1940  Psych Admission Type (Psych Patients Only)  Admission Status Voluntary  Psychosocial Assessment  Patient Complaints Depression;Sadness  Eye Contact Fair  Facial Expression Sad  Affect Appropriate to circumstance;Depressed;Sad  Speech Logical/coherent  Interaction Assertive  Motor Activity Slow  Appearance/Hygiene Disheveled  Behavior Characteristics Cooperative;Calm  Mood Depressed;Pleasant;Sad  Thought Process  Coherency WDL  Content WDL  Delusions None reported or observed  Perception WDL  Hallucination None reported or observed  Judgment Impaired  Confusion None  Danger to Self  Current suicidal ideation? Denies  Self-Injurious Behavior No self-injurious ideation or behavior indicators observed or expressed   Agreement Not to Harm Self Yes  Description of Agreement verbally contracts for safety  Danger to Others  Danger to Others None reported or observed

## 2020-05-25 NOTE — Progress Notes (Signed)
Psychoeducational Group Note  Date:  05/25/2020 Time:  2115  Group Topic/Focus:  Wrap-Up Group:   The focus of this group is to help patients review their daily goal of treatment and discuss progress on daily workbooks.  Participation Level: Did Not Attend  Participation Quality:  Not Applicable  Affect:  Not Applicable  Cognitive:  Not Applicable  Insight:  Not Applicable  Engagement in Group: Not Applicable  Additional Comments:  The patient did not attend group this evening.   Hazle Coca S 05/25/2020, 9:16 PM

## 2020-05-25 NOTE — Progress Notes (Signed)
   05/24/20 2150  Psych Admission Type (Psych Patients Only)  Admission Status Voluntary  Psychosocial Assessment  Patient Complaints Anxiety  Eye Contact Fair  Facial Expression Pained  Affect Appropriate to circumstance  Speech Logical/coherent  Interaction Assertive  Motor Activity Slow  Appearance/Hygiene Disheveled  Behavior Characteristics Cooperative;Calm  Mood Pleasant  Thought Process  Coherency WDL  Content WDL  Delusions None reported or observed  Perception WDL  Hallucination None reported or observed  Judgment Impaired  Confusion None  Danger to Self  Current suicidal ideation? Denies  Self-Injurious Behavior No self-injurious ideation or behavior indicators observed or expressed   Agreement Not to Harm Self Yes  Description of Agreement verbally contracts for safety  Danger to Others  Danger to Others None reported or observed

## 2020-05-25 NOTE — Progress Notes (Signed)
   05/25/20 0644  Vital Signs  Pulse Rate 104  BP 105/77  BP Location Right Arm  BP Method Automatic  Patient Position (if appropriate) Standing  D: Patient Presents with depressed  mood and affect.  Patient was calm and cooperative during med pass and took medicine without incident.   Patient denies suicidal thoughts and self harming thoughts. A:  Patient took scheduled medicine.  Support and encouragement provided Routine safety checks conducted every 15 minutes. Patient  Informed to notify staff with any concerns.  R:  Safety maintained.

## 2020-05-25 NOTE — Progress Notes (Signed)
[facility] MD Progress Note  05/25/2020 12:35 PM Sherrel Ploch  MRN:  [id number] Subjective: Patientstates her mood is significantly better today. She states she is tolerating Effexor well except feeling groggy in the morning. She states her sleep was better last night, there were no withdrawal symptoms. She states her appetite is good. She talks about her conversation with her fiance last evening and states she called her bunch of times yesterday and she is really looking forward to meet them after her discharge.She denies suicidal ideation.She denies any AVH or HI.  Objective :Patient is seen and examined. She is pleasant, thankful on approach .She looks better, cooperative and oriented * 4. She smiles multiple times during the conversations. She denies any other side effects from medication. She does not seems like responding to the internal stimuli and shoes no self injurious behavior on the unit. Her blood pressure is 105/77, pulse 104, temperature 98 F (36.7 C). She slept 6.75 hours last night. No new labs today.  Principal Problem: <principal problem not specified> Diagnosis: Active Problems:   MDD (major depressive disorder), recurrent severe, without psychosis (HCC)   Amphetamine-induced mood disorder (HCC)  Total Time spent with patient: 25 minutes  Past Psychiatric History: See H & P  Past Medical History:  Past Medical History:  Diagnosis Date   Gallstones    Kidney stone    Ovarian cyst    PUD (peptic ulcer disease)     Past Surgical History:  Procedure Laterality Date   LITHOTRIPSY     Family History:  Family History  Problem Relation Age of Onset   Cancer Mother    Bipolar disorder Sister    Anxiety disorder Brother    ADD / ADHD Brother    Family Psychiatric  History: See H & P Social History:  Social History   Substance and Sexual Activity  Alcohol Use Not Currently   Comment: socially     Social History   Substance and Sexual Activity  Drug  Use Yes   Types: Amphetamines    Social History   Socioeconomic History   Marital status: Single    Spouse name: Not on file   Number of children: Not on file   Years of education: Not on file   Highest education level: Not on file  Occupational History   Not on file  Tobacco Use   Smoking status: Current Every Day Smoker    Packs/day: 1.00    Years: 0.50    Pack years: 0.50    Types: Cigarettes   Smokeless tobacco: Never Used  Substance and Sexual Activity   Alcohol use: Not Currently    Comment: socially   Drug use: Yes    Types: Amphetamines   Sexual activity: Yes    Birth control/protection: None  Other Topics Concern   Not on file  Social History Narrative   Not on file   Social Determinants of Health   Financial Resource Strain:    Difficulty of Paying Living Expenses:   Food Insecurity:    Worried About Programme researcher, broadcasting/film/video in the Last Year:    Barista in the Last Year:   Transportation Needs:    Freight forwarder (Medical):    Lack of Transportation (Non-Medical):   Physical Activity:    Days of Exercise per Week:    Minutes of Exercise per Session:   Stress:    Feeling of Stress :   Social Connections:    Frequency of Communication  with Friends and Family:    Frequency of Social Gatherings with Friends and Family:    Attends Religious Services:    Active Member of Clubs or Organizations:    Attends Banker Meetings:    Marital Status:    Additional Social History:                         Sleep: Good  Appetite:  Good  Current Medications: Current Facility-Administered Medications  Medication Dose Route Frequency Provider Last Rate Last Admin   acetaminophen (TYLENOL) tablet 650 mg  650 mg Oral Q6H PRN Anike, Adaku C, NP   650 mg at 05/25/20 1101   bisacodyl (DULCOLAX) EC tablet 5 mg  5 mg Oral Daily PRN Thermon Leyland, NP       docusate sodium (COLACE) capsule 100 mg  100 mg  Oral BID PRN Thermon Leyland, NP   100 mg at 05/24/20 1449   LORazepam (ATIVAN) tablet 0.5 mg  0.5 mg Oral Q6H PRN Cobos, Rockey Situ, MD   0.5 mg at 05/24/20 1736   mirtazapine (REMERON) tablet 7.5 mg  7.5 mg Oral QHS Cobos, Rockey Situ, MD   7.5 mg at 05/24/20 2109   nicotine (NICODERM CQ - dosed in mg/24 hours) patch 21 mg  21 mg Transdermal Daily Cobos, Rockey Situ, MD   21 mg at 05/25/20 0756   pantoprazole (PROTONIX) EC tablet 40 mg  40 mg Oral Daily Michah Minton, Geralynn Rile, MD   40 mg at 05/25/20 0756   venlafaxine XR (EFFEXOR-XR) 24 hr capsule 75 mg  75 mg Oral Q breakfast Cobos, Rockey Situ, MD   75 mg at 05/25/20 0756    Lab Results:  Results for orders placed or performed during the hospital encounter of 05/20/20 (from the past 48 hour(s))  Basic metabolic panel     Status: None   Collection Time: 05/24/20  6:54 AM  Result Value Ref Range   Sodium 137 135 - 145 mmol/L   Potassium 4.0 3.5 - 5.1 mmol/L   Chloride 102 98 - 111 mmol/L   CO2 26 22 - 32 mmol/L   Glucose, Bld 88 70 - 99 mg/dL    Comment: Glucose reference range applies only to samples taken after fasting for at least 8 hours.   BUN 13 6 - 20 mg/dL   Creatinine, Ser 7.01 0.44 - 1.00 mg/dL   Calcium 8.9 8.9 - 77.9 mg/dL   GFR calc non Af Amer >60 >60 mL/min   GFR calc Af Amer >60 >60 mL/min   Anion gap 9 5 - 15    Comment: Performed at [facility], [address], [place], [place] [address]    Blood Alcohol level:  Lab Results  Component Value Date   ETH <10 05/20/2020   ETH 228 (H) 03/22/2016    Metabolic Disorder Labs: Lab Results  Component Value Date   HGBA1C 5.3 03/24/2016   MPG 105 03/24/2016   No results found for: PROLACTIN Lab Results  Component Value Date   CHOL 215 (H) 03/24/2016   TRIG 179 (H) 03/24/2016   HDL 52 03/24/2016   CHOLHDL 4.1 03/24/2016   VLDL 36 03/24/2016   LDLCALC 127 (H) 03/24/2016    Physical Findings: AIMS: Facial and Oral Movements Muscles of Facial  Expression: None, normal Lips and Perioral Area: None, normal Jaw: None, normal Tongue: None, normal,Extremity Movements Upper (arms, wrists, hands, fingers): None, normal Lower (legs, knees,  ankles, toes): None, normal, Trunk Movements Neck, shoulders, hips: None, normal, Overall Severity Severity of abnormal movements (highest score from questions above): None, normal Incapacitation due to abnormal movements: None, normal Patient's awareness of abnormal movements (rate only patient's report): No Awareness, Dental Status Current problems with teeth and/or dentures?: No Does patient usually wear dentures?: No  CIWA:    COWS:     Musculoskeletal: Strength & Muscle Tone: within normal limits Gait & Station: normal Patient leans: N/A  Psychiatric Specialty Exam: Physical Exam Neurological:     General: No focal deficit present.     Mental Status: She is oriented to person, place, and time.  Psychiatric:        Behavior: Behavior normal.        Thought Content: Thought content normal.        Judgment: Judgment normal.     Review of Systems  Blood pressure 105/77, pulse 104, temperature 98 F (36.7 C), temperature source Oral, resp. rate 18, height 5' 4.96" (1.65 m), weight 67.6 kg, last menstrual period 05/13/2020, SpO2 99 %.Body mass index is 24.82 kg/m.  General Appearance: Casual  Eye Contact:  Good  Speech:  Normal Rate  Volume:  Normal  Mood:  Improved  Affect:  Appropriate  Thought Process:  Coherent  Orientation:  Full (Time, Place, and Person)  Thought Content:  WDL  Suicidal Thoughts:  No  Homicidal Thoughts:  No  Memory:  Immediate;   Good  Judgement:  Good  Insight:  Good  Psychomotor Activity:  Normal  Concentration:  Concentration: Good and Attention Span: Good  Recall:  Good  Fund of Knowledge:  Good  Language:  Good  Akathisia:  No  Handed:  Right  AIMS (if indicated):     Assets:  Communication Skills Desire for Improvement Resilience Social  Support  ADL's:  Intact  Cognition:  WNL  Sleep:  Number of Hours: 6.75   Assessment: Patient is a 34 yo women who was livingalonepresented to ER with suicidal ideations and depressed mood. 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 She also has past history of recurrent episodes of major depressive disorder and was taking venlafaxine from last 6 years.  D/D: 1. 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 2. Major depressive disorder-feels hopeless, depressed, has no energy, fatigued, low self esteem, difficulty sleeping at night and has suicidal ideations. 3.Dysthymia- patient complains that she has always been depressed and it began when she was in her 37's.  Patient looks better, mood good, sleep & appetite good, feels groggy in the morning, no other reported side effects.   Treatment Plan Summary: Daily contact with patient to assess and evaluate symptoms and progress in treatment.  Plan:  1.Continue Effexor XR 75 mg 24 hr capsule for mood stabilization and anxiety. 2. Continue Remeron 7.5 mgs QHS to help with sleep. 3. Continue Pantoprazole 40 mg for her acid reflux symptoms. 4. ContinueAtivan 0.5 mg PO Q6H PRNfor anxiety. 5. Encouragement for group therapy. 6. Disposition in progress.   Arnoldo Lenis, MD 05/25/2020, 12:35 PM

## 2020-05-25 NOTE — Progress Notes (Signed)
   05/25/20 1940  COVID-19 Daily Checkoff  Have you had a fever (temp > 37.80C/100F)  in the past 24 hours?  No  COVID-19 EXPOSURE  Have you traveled outside the state in the past 14 days? No  Have you been in contact with someone with a confirmed diagnosis of COVID-19 or PUI in the past 14 days without wearing appropriate PPE? No  Have you been living in the same home as a person with confirmed diagnosis of COVID-19 or a PUI (household contact)? No  Have you been diagnosed with COVID-19? No

## 2020-05-26 DIAGNOSIS — F332 Major depressive disorder, recurrent severe without psychotic features: Secondary | ICD-10-CM | POA: Diagnosis not present

## 2020-05-26 DIAGNOSIS — F1594 Other stimulant use, unspecified with stimulant-induced mood disorder: Secondary | ICD-10-CM | POA: Diagnosis not present

## 2020-05-26 LAB — SARS CORONAVIRUS 2 BY RT PCR (HOSPITAL ORDER, PERFORMED IN ~~LOC~~ HOSPITAL LAB): SARS Coronavirus 2: NEGATIVE

## 2020-05-26 MED ORDER — NICOTINE 21 MG/24HR TD PT24: 21.0000 mg | MEDICATED_PATCH | Freq: Every day | TRANSDERMAL | 0 refills | Status: DC

## 2020-05-26 MED ORDER — LORAZEPAM 0.5 MG PO TABS: 0.5000 mg | ORAL_TABLET | Freq: Four times a day (QID) | ORAL | 0 refills | Status: DC | PRN

## 2020-05-26 MED ORDER — MIRTAZAPINE 7.5 MG PO TABS: 7.5000 mg | ORAL_TABLET | Freq: Every day | ORAL | 0 refills | Status: DC

## 2020-05-26 MED ORDER — VENLAFAXINE HCL ER 75 MG PO CP24: 75.0000 mg | ORAL_CAPSULE | Freq: Every day | ORAL | 0 refills | Status: DC

## 2020-05-26 MED ORDER — VENLAFAXINE HCL ER 75 MG PO CP24: 75.0000 mg | ORAL_CAPSULE | Freq: Every day | ORAL | 11 refills | Status: DC

## 2020-05-26 MED ORDER — PANTOPRAZOLE SODIUM 40 MG PO TBEC: 40.0000 mg | DELAYED_RELEASE_TABLET | Freq: Every day | ORAL | 0 refills | Status: DC

## 2020-05-26 NOTE — Tx Team (Cosign Needed)
Interdisciplinary Treatment and Diagnostic Plan Update  05/26/2020 Time of Session: 9:30am Kathryn Holland MRN: [id number]  Principal Diagnosis: <principal problem not specified>  Secondary Diagnoses: Active Problems:   MDD (major depressive disorder), recurrent severe, without psychosis (HCC)   Amphetamine-induced mood disorder (HCC)   Current Medications:  Current Facility-Administered Medications  Medication Dose Route Frequency Provider Last Rate Last Admin  . acetaminophen (TYLENOL) tablet 650 mg  650 mg Oral Q6H PRN Anike, Adaku C, NP   650 mg at 05/26/20 3500  . bisacodyl (DULCOLAX) EC tablet 5 mg  5 mg Oral Daily PRN Fransisca Kaufmann A, NP      . docusate sodium (COLACE) capsule 100 mg  100 mg Oral BID PRN Fransisca Kaufmann A, NP   100 mg at 05/24/20 1449  . LORazepam (ATIVAN) tablet 0.5 mg  0.5 mg Oral Q6H PRN Cobos, Rockey Situ, MD   0.5 mg at 05/24/20 1736  . mirtazapine (REMERON) tablet 7.5 mg  7.5 mg Oral QHS Cobos, Rockey Situ, MD   7.5 mg at 05/25/20 2150  . nicotine (NICODERM CQ - dosed in mg/24 hours) patch 21 mg  21 mg Transdermal Daily Cobos, Rockey Situ, MD   21 mg at 05/26/20 0829  . pantoprazole (PROTONIX) EC tablet 40 mg  40 mg Oral Daily Dagar, Geralynn Rile, MD   40 mg at 05/26/20 0821  . venlafaxine XR (EFFEXOR-XR) 24 hr capsule 75 mg  75 mg Oral Q breakfast Cobos, Rockey Situ, MD   75 mg at 05/26/20 9381   PTA Medications: Medications Prior to Admission  Medication Sig Dispense Refill Last Dose  . amphetamine-dextroamphetamine (ADDERALL) 20 MG tablet Take 20 mg by mouth 2 (two) times daily. (Patient not taking: Reported on 05/20/2020)     . etonogestrel (NEXPLANON) 68 MG IMPL implant 68 mg by Subdermal route once.      . lisdexamfetamine (VYVANSE) 20 MG capsule Take 20 mg by mouth daily.     Marland Kitchen omeprazole (PRILOSEC) 40 MG capsule Take 1 capsule (40 mg total) by mouth daily. (Patient not taking: Reported on 05/20/2020) 30 capsule 0   . potassium chloride SA (KLOR-CON) 20 MEQ tablet  Take 1 tablet (20 mEq total) by mouth 2 (two) times daily. (Patient not taking: Reported on 05/20/2020) 10 tablet 0   . sucralfate (CARAFATE) 1 g tablet Take 1 tablet (1 g total) by mouth 4 (four) times daily -  with meals and at bedtime. (Patient not taking: Reported on 05/20/2020) 40 tablet 1   . venlafaxine XR (EFFEXOR-XR) 75 MG 24 hr capsule Take 75 mg by mouth daily with breakfast.       Patient Stressors: Medication change or noncompliance Substance abuse  Patient Strengths: General fund of knowledge Motivation for treatment/growth  Treatment Modalities: Medication Management, Group therapy, Case management,  1 to 1 session with clinician, Psychoeducation, Recreational therapy.   Physician Treatment Plan for Primary Diagnosis: <principal problem not specified> Long Term Goal(s): Improvement in symptoms so as ready for discharge Improvement in symptoms so as ready for discharge   Short Term Goals: Compliance with prescribed medications will improve Ability to identify changes in lifestyle to reduce recurrence of condition will improve Ability to verbalize feelings will improve Ability to disclose and discuss suicidal ideas Ability to maintain clinical measurements within normal limits will improve Compliance with prescribed medications will improve  Medication Management: Evaluate patient's response, side effects, and tolerance of medication regimen.  Therapeutic Interventions: 1 to 1 sessions, Unit Group sessions and Medication administration.  Evaluation of Outcomes: Adequate for Discharge  Physician Treatment Plan for Secondary Diagnosis: Active Problems:   MDD (major depressive disorder), recurrent severe, without psychosis (HCC)   Amphetamine-induced mood disorder (HCC)  Long Term Goal(s): Improvement in symptoms so as ready for discharge Improvement in symptoms so as ready for discharge   Short Term Goals: Compliance with prescribed medications will improve Ability to  identify changes in lifestyle to reduce recurrence of condition will improve Ability to verbalize feelings will improve Ability to disclose and discuss suicidal ideas Ability to maintain clinical measurements within normal limits will improve Compliance with prescribed medications will improve     Medication Management: Evaluate patient's response, side effects, and tolerance of medication regimen.  Therapeutic Interventions: 1 to 1 sessions, Unit Group sessions and Medication administration.  Evaluation of Outcomes: Adequate for Discharge   RN Treatment Plan for Primary Diagnosis: <principal problem not specified> Long Term Goal(s): Knowledge of disease and therapeutic regimen to maintain health will improve  Short Term Goals: Compliance with prescribed medications will improve  Medication Management: RN will administer medications as ordered by provider, will assess and evaluate patient's response and provide education to patient for prescribed medication. RN will report any adverse and/or side effects to prescribing provider.  Therapeutic Interventions: 1 on 1 counseling sessions, Psychoeducation, Medication administration, Evaluate responses to treatment, Monitor vital signs and CBGs as ordered, Perform/monitor CIWA, COWS, AIMS and Fall Risk screenings as ordered, Perform wound care treatments as ordered.  Evaluation of Outcomes: Adequate for Discharge   LCSW Treatment Plan for Primary Diagnosis: <principal problem not specified> Long Term Goal(s): Safe transition to appropriate next level of care at discharge, Engage patient in therapeutic group addressing interpersonal concerns.  Short Term Goals: Engage patient in aftercare planning with referrals and resources and Increase skills for wellness and recovery  Therapeutic Interventions: Assess for all discharge needs, 1 to 1 time with Social worker, Explore available resources and support systems, Assess for adequacy in community  support network, Educate family and significant other(s) on suicide prevention, Complete Psychosocial Assessment, Interpersonal group therapy.  Evaluation of Outcomes: Adequate for Discharge   Progress in Treatment: Attending groups: No. Participating in groups: No. Taking medication as prescribed: Yes. Toleration medication: Yes. Family/Significant other contact made: Yes, individual(s) contacted:  fiance. Patient understands diagnosis: Yes. Discussing patient identified problems/goals with staff: No. Medical problems stabilized or resolved: Yes. Denies suicidal/homicidal ideation: Yes. Issues/concerns per patient self-inventory: No. Other:   New problem(s) identified: No, Describe:  no  New Short Term/Long Term Goal(s):  Patient Goals:   Patient did not attend  Discharge Plan or Barriers:   Reason for Continuation of Hospitalization: Depression Medication stabilization  Estimated Length of Stay: 1-3 days  Attendees: Patient: Did not attend 05/26/2020   Physician:  05/26/2020  Nursing:  05/26/2020   RN Care Manager: 05/26/2020  Social Worker: Ruthann Cancer, LCSW 05/26/2020   Recreational Therapist:  05/26/2020   Other:  05/26/2020   Other:  05/26/2020   Other: 05/26/2020    Scribe for Treatment Team: Otelia Santee, LCSW 05/26/2020 9:54 AM

## 2020-05-26 NOTE — Progress Notes (Signed)
  [facility] Adult Case Management Discharge Plan :  Will you be returning to the same living situation after discharge:  Yes,  home with fiance At discharge, do you have transportation home?: Yes,  fiance Do you have the ability to pay for your medications: Yes,  medicaid  Release of information consent forms completed and in the chart;  Patient's signature needed at discharge.  Patient to Follow up at:  Follow-up Information    [facility]. Go on 06/14/2020.   Why: You have a hospital discharge appointment on 06/14/20 at 8:30 am for medication management and therapy services. This appointment will be held in person.  Please use the side entrance. Contact information: [address] [place] [address] [phone]               Next level of care provider has access to [facility] Link:no  Safety Planning and Suicide Prevention discussed: Yes,  yes     Has patient been referred to the Quitline?: Yes, faxed on 05/26/20  Patient has been referred for addiction treatment: N/A  Erin Sons, LCSW 05/26/2020, 11:00 AM

## 2020-05-26 NOTE — Progress Notes (Signed)
RN met with pt and reviewed pt's discharge instructions.  Pt verbalized understanding of discharge instructions and pt did not have any questions. RN reviewed and provided pt with a copy of SRA, AVS and Transition Record.  RN returned pt's belongings to pt.  Pt given a paper prescription for Lorazepam and other medications were sent electronically to pt's pharmacy. Pt denied SI/HI/AVH and voiced no concerns.  Pt was appreciative of the care pt received at [facility]. RN assisted pt to get in safe transport vehicle so driver could take pt home.

## 2020-05-26 NOTE — B[facility] Counselor (Signed)
CSW spoke with pt about d/c. Pt will return home with Fiance, fiance will pick pt up. Pt explains she can't attend her 7/30 appointment at [facility]. CSW rescheduled for 8/16. Pt also consents to [facility] referral. Referral form faxed to quit line.

## 2020-05-26 NOTE — Discharge Summary (Addendum)
Physician Discharge Summary Note  Patient:  Kathryn Holland is an 34 y.o., female MRN:  [id number] DOB:  [birth year] Patient phone:  [phone] (home)  Patient address:   [address] [place] [address],  Total Time spent with patient: 30 minutes  Date of Admission:  05/20/2020 Date of Discharge: 05/26/20  Reason for Admission: Patient presented to ED on 7/22 for depression and suicidal ideations, neurovegetative symptoms.  Reported methamphetamine use several times a week over the last several months.  Also reported being treated with Effexor XR which she had stopped a few days prior to admission and reported feeling she was experiencing symptoms of venlafaxine withdrawal.  Admission UDS was positive for amphetamines.She was admitted to inpatient unit for stabilization and further evaluation.  Principal Problem: <principal problem not specified> Discharge Diagnoses: Active Problems:   MDD (major depressive disorder), recurrent severe, without psychosis (HCC)   Amphetamine-induced mood disorder (HCC)   Past Psychiatric History: She has past psychiatric history of severe recurrent major depression without psychotic features, multiple suicide attempts, substance dependence.  Past Medical History:  Past Medical History:  Diagnosis Date  . Gallstones   . Kidney stone   . Ovarian cyst   . PUD (peptic ulcer disease)     Past Surgical History:  Procedure Laterality Date  . LITHOTRIPSY     Family History:  Family History  Problem Relation Age of Onset  . Cancer Mother   . Bipolar disorder Sister   . Anxiety disorder Brother   . ADD / ADHD Brother    Family Psychiatric  History: Brother and sisters has depression, anxiety, ADHD and learning disorder. Social History:  Social History   Substance and Sexual Activity  Alcohol Use Not Currently   Comment: socially     Social History   Substance and Sexual Activity  Drug Use Yes  . Types: Amphetamines    Social  History   Socioeconomic History  . Marital status: Single    Spouse name: Not on file  . Number of children: Not on file  . Years of education: Not on file  . Highest education level: Not on file  Occupational History  . Not on file  Tobacco Use  . Smoking status: Current Every Day Smoker    Packs/day: 1.00    Years: 0.50    Pack years: 0.50    Types: Cigarettes  . Smokeless tobacco: Never Used  Substance and Sexual Activity  . Alcohol use: Not Currently    Comment: socially  . Drug use: Yes    Types: Amphetamines  . Sexual activity: Yes    Birth control/protection: None  Other Topics Concern  . Not on file  Social History Narrative  . Not on file   Social Determinants of Health   Financial Resource Strain:   . Difficulty of Paying Living Expenses:   Food Insecurity:   . Worried About Programme researcher, broadcasting/film/video in the Last Year:   . Barista in the Last Year:   Transportation Needs:   . Freight forwarder (Medical):   Marland Kitchen Lack of Transportation (Non-Medical):   Physical Activity:   . Days of Exercise per Week:   . Minutes of Exercise per Session:   Stress:   . Feeling of Stress :   Social Connections:   . Frequency of Communication with Friends and Family:   . Frequency of Social Gatherings with Friends and Family:   . Attends Religious Services:   . Active Member  of Clubs or Organizations:   . Attends Banker Meetings:   Marland Kitchen Marital Status:     Hospital Course:    Patient was started on duloxetine ( Cymbalta) 30 mg Q day PO for depressed mood,  potassium chloride 20 meq for low potassium and she has normal kidney functions, Pantoprazole 40 mg for her acid reflux symptoms and continued on Trazodone 50 mg QHS PRN for sleep and Hydroxyzine 25 mg PO TID PRN for anxiety. Patient reported she does not want to resume or restart Effexor XR, as she develops WDL symptoms when she runs out and because she associates this medication to methamphetamine use.    She was complaining of withdrawal symptoms from stopping Effexor and that's why patient agreed to resume Effexor XR at 75 mgrs QDAY and proceed with gradual taper. As such, will discontinue Cymbalta, which had been started at admission ( 30 mgrs QDAY) . We discussed other options and may benefit from Remeron for depression and insomnia.  Then Remeron 7.5 mgrs QHS was added and continue Effexor XR 75 mgrs QDAY. She had reported some AM sedation, feeling " groggy"but this is improving and on assessment presents fully alert and attentive.  On the day of discharge:  patient is alert, attentive, calm, pleasant on approach, exhibiting a fuller range of affect, reports she is feeling a lot better than on admission, no thought disorder, denies SI, denies HI, no hallucinations, future oriented, stating that she is looking forward to spend a week in [place] with family in the near future .Denies physical symptoms or discomfort or residual /ongoing symptoms of Venlafaxine WDL and does not appear to be in any acute distress or discomfort at this time.Behavior on unit in good control, pleasant on approach. Denies medication side effects. With her express consent spoke with her BF, who corroborates patient is improved and is in agreement with discharge.  Physical Findings: AIMS: Facial and Oral Movements Muscles of Facial Expression: None, normal Lips and Perioral Area: None, normal Jaw: None, normal Tongue: None, normal,Extremity Movements Upper (arms, wrists, hands, fingers): None, normal Lower (legs, knees, ankles, toes): None, normal, Trunk Movements Neck, shoulders, hips: None, normal, Overall Severity Severity of abnormal movements (highest score from questions above): None, normal Incapacitation due to abnormal movements: None, normal Patient's awareness of abnormal movements (rate only patient's report): No Awareness, Dental Status Current problems with teeth and/or dentures?: No Does patient usually  wear dentures?: No  CIWA:    COWS:     Musculoskeletal: Strength & Muscle Tone: within normal limits Gait & Station: normal Patient leans: N/A  Psychiatric Specialty Exam: Physical Exam Constitutional:      Appearance: Normal appearance. She is normal weight.  Musculoskeletal:        General: Normal range of motion.     Cervical back: Normal range of motion.  Neurological:     Mental Status: She is alert.  Psychiatric:        Mood and Affect: Mood normal.        Behavior: Behavior normal.        Thought Content: Thought content normal.        Judgment: Judgment normal.     Review of Systems  Blood pressure 113/84, pulse (!) 109, temperature 98.9 F (37.2 C), temperature source Oral, resp. rate 18, height 5' 4.96" (1.65 m), weight 67.6 kg, last menstrual period 05/13/2020, SpO2 99 %.Body mass index is 24.82 kg/m.  General Appearance: Fairly Groomed  Eye Contact:  Good  Speech:  Clear and Coherent  Volume:  Normal  Mood:  iMROVED MOOD  Affect:  Appropriate  Thought Process:  Goal Directed  Orientation:  Full (Time, Place, and Person)  Thought Content:  WDL  Suicidal Thoughts:  No  Homicidal Thoughts:  No  Memory:  Immediate;   Good  Judgement:  Fair  Insight:  Fair  Psychomotor Activity:  Normal  Concentration:  Concentration: Good and Attention Span: Good  Recall:  Fair  Fund of Knowledge:  Good  Language:  Good  Akathisia:  No  Handed:  Right  AIMS (if indicated):     Assets:  Communication Skills Desire for Improvement Resilience Social Support  ADL's:  Intact  Cognition:  WNL  Sleep:  Number of Hours: 6.75        Has this patient used any form of tobacco in the last 30 days? (Cigarettes, Smokeless Tobacco, Cigars, and/or Pipes) Yes, N/A  Blood Alcohol level:  Lab Results  Component Value Date   ETH <10 05/20/2020   ETH 228 (H) 03/22/2016    Metabolic Disorder Labs:  Lab Results  Component Value Date   HGBA1C 5.3 03/24/2016   MPG 105  03/24/2016   No results found for: PROLACTIN Lab Results  Component Value Date   CHOL 215 (H) 03/24/2016   TRIG 179 (H) 03/24/2016   HDL 52 03/24/2016   CHOLHDL 4.1 03/24/2016   VLDL 36 03/24/2016   LDLCALC 127 (H) 03/24/2016    See Psychiatric Specialty Exam and Suicide Risk Assessment completed by Attending Physician prior to discharge.  Discharge destination:  Home  Is patient on multiple antipsychotic therapies at discharge:  No   Has Patient had three or more failed trials of antipsychotic monotherapy by history:  No  Recommended Plan for Multiple Antipsychotic Therapies: NA  Discharge Instructions    Discharge patient   Complete by: As directed    Discharge disposition: 01-Home or Self Care   Discharge patient date: 05/26/2020     Allergies as of 05/26/2020      Reactions   Stadol [butorphanol Tartrate] Other (See Comments)   Low heart rate      Medication List    STOP taking these medications   amphetamine-dextroamphetamine 20 MG tablet Commonly known as: ADDERALL   lisdexamfetamine 20 MG capsule Commonly known as: VYVANSE   omeprazole 40 MG capsule Commonly known as: PRILOSEC   sucralfate 1 g tablet Commonly known as: Carafate     TAKE these medications     Indication  LORazepam 0.5 MG tablet Commonly known as: ATIVAN Take 1 tablet (0.5 mg total) by mouth every 6 (six) hours as needed for anxiety.  Indication: Feeling Anxious   mirtazapine 7.5 MG tablet Commonly known as: REMERON Take 1 tablet (7.5 mg total) by mouth at bedtime.  Indication: Major Depressive Disorder   Nexplanon 68 MG Impl implant Generic drug: etonogestrel 68 mg by Subdermal route once.  Indication: Birth Control Treatment   nicotine 21 mg/24hr patch Commonly known as: NICODERM CQ - dosed in mg/24 hours Place 1 patch (21 mg total) onto the skin daily. Start taking on: May 27, 2020  Indication: Nicotine Addiction   pantoprazole 40 MG tablet Commonly known as:  PROTONIX Take 1 tablet (40 mg total) by mouth daily. Start taking on: May 27, 2020  Indication: Gastroesophageal Reflux Disease   potassium chloride SA 20 MEQ tablet Commonly known as: KLOR-CON Take 1 tablet (20 mEq total) by mouth 2 (two) times daily.  Indication:  Low Amount of Potassium in the Blood   venlafaxine XR 75 MG 24 hr capsule Commonly known as: EFFEXOR-XR Take 75 mg by mouth daily with breakfast.  Indication: Major Depressive Disorder       Follow-up Information    Inc, [facility]. Go on 06/14/2020.   Why: You have a hospital discharge appointment on 06/14/20 at 8:30 am for medication management and therapy services. This appointment will be held in person.  Please use the side entrance. Contact information: [address] [place] [address] [phone]               Follow-up recommendations:  Activity:  Normal Diet:  Normal  Comments:   Prescriptions given at discharge. Patient agreeable to plan.Given opportunity to ask questions. Appears to feel comfortable with discharge denies any current suicidal or homicidal thought. Patient is also instructed prior to discharge to: Take all medications as prescribed by his/her mental healthcare provider. Report any adverse effects and or reactions from the medicines to his/her outpatient provider promptly. Patient has been instructed & cautioned: To not engage in alcohol and or illegal drug use while on prescription medicines. In the event of worsening symptoms, patient is instructed to call the crisis hotline, 911 and or go to the nearest ED for appropriate evaluation and treatment of symptoms. To follow-up with his/her primary care provider for your other medical issues, concerns and or health care needs.   Signed: Arnoldo Lenis, MD 05/26/2020, 10:25 AM   Patient seen, Suicide Assessment Completed.  Disposition Plan Reviewed

## 2020-05-26 NOTE — [facility] Suicide Risk Assessment (Signed)
[facility] Discharge Suicide Risk Assessment   Principal Problem: Depression, Substance Use Disorder  Discharge Diagnoses: Active Problems:   MDD (major depressive disorder), recurrent severe, without psychosis (HCC)   Amphetamine-induced mood disorder (HCC)   Total Time spent with patient: 30 minutes  Musculoskeletal: Strength & Muscle Tone: within normal limits Gait & Station: normal Patient leans: N/A  Psychiatric Specialty Exam: Review of Systems denies headache , denies chest pain or shortness of breath, denies vomiting   Blood pressure 113/84, pulse (!) 109, temperature 98.9 F (37.2 C), temperature source Oral, resp. rate 18, height 5' 4.96" (1.65 m), weight 67.6 kg, last menstrual period 05/13/2020, SpO2 99 %.Body mass index is 24.82 kg/m.  General Appearance: improving grooming   Eye Contact::  Good  Speech:  Normal Rate409  Volume:  Normal  Mood:  improved mood , states " I feel a lot better"  Affect:  Appropriate and fuller in range '  Thought Process:  Linear and Descriptions of Associations: Intact  Orientation:  Full (Time, Place, and Person)  Thought Content:  no hallucinations, no delusions, not internally preoccupied   Suicidal Thoughts:  No denies suicidal or self injurious ideations, denies homicidal or violent ideations  Homicidal Thoughts:  No  Memory:  recetn and remote grossly intact   Judgement:  Improving   Insight:  fair/improving  Psychomotor Activity:  Normal  Concentration:  Good  Recall:  Good  Fund of Knowledge:Good  Language: Good  Akathisia:  Negative  Handed:  Right  AIMS (if indicated):     Assets:  Communication Skills Desire for Improvement Resilience  Sleep:  Number of Hours: 6.75  Cognition: WNL  ADL's:  Intact   Mental Status Per Nursing Assessment::   On Admission:  Suicidal ideation indicated by patient, Suicide plan  Demographic Factors:  24 y old female, lives with fiance and son, currently unemployed .  Loss  Factors: Substance abuse, Venlafaxine withdrawal symptoms  Historical Factors: History of depression, history of prior psychiatric admission, history of methamphetamine use disorder  Risk Reduction Factors:   Sense of responsibility to family, Living with another person, especially a relative and Positive coping skills or problem solving skills  Continued Clinical Symptoms:  Today patient is alert, attentive, calm, pleasant on approach, exhibiting a fuller range of affect, reports she is feeling a lot better than on admission, no thought disorder, denies SI, denies HI, no hallucinations, future oriented, stating that she is looking forward to spend a week in [place] with family in the near future . Denies physical symptoms or discomfort or residual /ongoing symptoms of Venlafaxine WDL and does not appear to be in any acute distress or discomfort at this time. Behavior on unit in good control, pleasant on approach Denies medication side effects With her express consent I spoke with her BF, who corroborates patient is improved and is in agreement with discharge   Cognitive Features That Contribute To Risk:  No gross cognitive deficits noted upon discharge. Is alert , attentive, and oriented x 3   Suicide Risk:  Mild:  Suicidal ideation of limited frequency, intensity, duration, and specificity.  There are no identifiable plans, no associated intent, mild dysphoria and related symptoms, good self-control (both objective and subjective assessment), few other risk factors, and identifiable protective factors, including available and accessible social support.   Follow-up Information    Inc, [facility]. Go on 05/28/2020.   Why: You have a hospital discharge appointment on 05/28/20 at 8:30 am for medication management and  therapy services. This appointment will be held in person.  Please use the side entrance. Contact information: [address] [place] [address] [phone]                Plan Of Care/Follow-up recommendations:  Activity:  as tolerated  Diet:  regular Tests:  NA Other:  See below Patient is expressing readiness for discharge and is leaving unit in good spirits , plans to return home Reports she is planning on going to AA meetings  Follow up as above   Craige Cotta, MD 05/26/2020, 9:10 AM

## 2020-08-15 ENCOUNTER — Emergency Department (HOSPITAL_COMMUNITY): Payer: Medicaid Other

## 2020-08-15 ENCOUNTER — Emergency Department (HOSPITAL_COMMUNITY)
Admission: EM | Admit: 2020-08-15 | Discharge: 2020-08-15 | Disposition: A | Discharge status: Home / Self Care | Payer: Medicaid Other | Attending: Emergency Medicine | Admitting: Emergency Medicine

## 2020-08-15 ENCOUNTER — Encounter (HOSPITAL_COMMUNITY): Payer: Self-pay | Admitting: Emergency Medicine

## 2020-08-15 ENCOUNTER — Other Ambulatory Visit: Payer: Self-pay

## 2020-08-15 DIAGNOSIS — N83202 Unspecified ovarian cyst, left side: Secondary | ICD-10-CM | POA: Insufficient documentation

## 2020-08-15 DIAGNOSIS — F17210 Nicotine dependence, cigarettes, uncomplicated: Secondary | ICD-10-CM | POA: Insufficient documentation

## 2020-08-15 DIAGNOSIS — Z20822 Contact with and (suspected) exposure to covid-19: Secondary | ICD-10-CM | POA: Diagnosis not present

## 2020-08-15 DIAGNOSIS — R1032 Left lower quadrant pain: Secondary | ICD-10-CM | POA: Diagnosis present

## 2020-08-15 LAB — URINALYSIS, ROUTINE W REFLEX MICROSCOPIC
Bacteria, UA: NONE SEEN
Bilirubin Urine: NEGATIVE
Glucose, UA: NEGATIVE mg/dL
Ketones, ur: NEGATIVE mg/dL
Leukocytes,Ua: NEGATIVE
Nitrite: NEGATIVE
Protein, ur: NEGATIVE mg/dL
Specific Gravity, Urine: 1.01 (ref 1.005–1.030)
pH: 5 (ref 5.0–8.0)

## 2020-08-15 LAB — COMPREHENSIVE METABOLIC PANEL
ALT: 37 U/L (ref 0–44)
AST: 23 U/L (ref 15–41)
Albumin: 4.6 g/dL (ref 3.5–5.0)
Alkaline Phosphatase: 67 U/L (ref 38–126)
Anion gap: 12 (ref 5–15)
BUN: 10 mg/dL (ref 6–20)
CO2: 23 mmol/L (ref 22–32)
Calcium: 9.3 mg/dL (ref 8.9–10.3)
Chloride: 103 mmol/L (ref 98–111)
Creatinine, Ser: 0.71 mg/dL (ref 0.44–1.00)
GFR, Estimated: >60 mL/min (ref >60)
Glucose, Bld: 90 mg/dL (ref 70–99)
Potassium: 3.3 mmol/L — ABNORMAL LOW (ref 3.5–5.1)
Sodium: 138 mmol/L (ref 135–145)
Total Bilirubin: 0.9 mg/dL (ref 0.3–1.2)
Total Protein: 8.3 g/dL — ABNORMAL HIGH (ref 6.5–8.1)

## 2020-08-15 LAB — CBC
HCT: 42.3 % (ref 36.0–46.0)
Hemoglobin: 14.2 g/dL (ref 12.0–15.0)
MCH: 29.6 pg (ref 26.0–34.0)
MCHC: 33.6 g/dL (ref 30.0–36.0)
MCV: 88.3 fL (ref 80.0–100.0)
Platelets: 481 10*3/uL — ABNORMAL HIGH (ref 150–400)
RBC: 4.79 MIL/uL (ref 3.87–5.11)
RDW: 12.3 % (ref 11.5–15.5)
WBC: 14.4 10*3/uL — ABNORMAL HIGH (ref 4.0–10.5)
nRBC: 0 % (ref 0.0–0.2)

## 2020-08-15 LAB — RESPIRATORY PANEL BY RT PCR (FLU A&B, COVID)
Influenza A by PCR: NEGATIVE
Influenza B by PCR: NEGATIVE
SARS Coronavirus 2 by RT PCR: NEGATIVE

## 2020-08-15 LAB — I-STAT BETA HCG BLOOD, ED (MC, WL, AP ONLY): I-stat hCG, quantitative: <5 m[IU]/mL (ref <5)

## 2020-08-15 LAB — LIPASE, BLOOD: Lipase: 27 U/L (ref 11–51)

## 2020-08-15 MED ORDER — HYDROCODONE-ACETAMINOPHEN 5-325 MG PO TABS
1.0000 | ORAL_TABLET | Freq: Four times a day (QID) | ORAL | 0 refills | Status: DC | PRN
Start: 2020-08-15 — End: 2021-03-11

## 2020-08-15 MED ORDER — FENTANYL CITRATE (PF) 100 MCG/2ML IJ SOLN
100.0000 ug | Freq: Once | INTRAMUSCULAR | Status: AC
  Administered 2020-08-15: 100 ug via INTRAVENOUS
  Filled 2020-08-15: qty 2

## 2020-08-15 MED ORDER — SODIUM CHLORIDE (PF) 0.9 % IJ SOLN
INTRAMUSCULAR | Status: AC
  Filled 2020-08-15: qty 50

## 2020-08-15 MED ORDER — IOHEXOL 300 MG/ML  SOLN
100.0000 mL | Freq: Once | INTRAMUSCULAR | Status: AC | PRN
  Administered 2020-08-15: 100 mL via INTRAVENOUS

## 2020-08-15 MED ORDER — ONDANSETRON 8 MG PO TBDP: 8.0000 mg | ORAL_TABLET | Freq: Three times a day (TID) | ORAL | 0 refills | Status: DC | PRN

## 2020-08-15 MED ORDER — ONDANSETRON HCL 4 MG/2ML IJ SOLN
4.0000 mg | Freq: Once | INTRAMUSCULAR | Status: AC
  Administered 2020-08-15: 4 mg via INTRAVENOUS
  Filled 2020-08-15: qty 2

## 2020-08-15 NOTE — ED Provider Notes (Signed)
[facility] DEPT Provider Note: Lowella Dell, MD, FACEP  CSN: [id number] MRN: [id number] ARRIVAL: 08/15/20 at 0058 ROOM: WA04/WA04   CHIEF COMPLAINT  Abdominal Pain   HISTORY OF PRESENT ILLNESS  08/15/20 4:34 AM Kathryn Holland is a 34 y.o. female with a history of gallstones.  She is here with left lower quadrant abdominal pain that has been present for about 3 days.  The pain has been waxing and waning.  It is aching in nature.  It is somewhat worse with movement or palpation.  She has had associated nausea, vomiting and constipation.  She has not had a fever.  She rates her pain is a 10 out of 10 at its worst but it is less severe right now.   Past Medical History:  Diagnosis Date  . Gallstones   . Kidney stone   . Ovarian cyst   . PUD (peptic ulcer disease)     Past Surgical History:  Procedure Laterality Date  . LITHOTRIPSY      Family History  Problem Relation Age of Onset  . Cancer Mother   . Bipolar disorder Sister   . Anxiety disorder Brother   . ADD / ADHD Brother     Social History   Tobacco Use  . Smoking status: Current Every Day Smoker    Packs/day: 1.00    Years: 0.50    Pack years: 0.50    Types: Cigarettes  . Smokeless tobacco: Never Used  Substance Use Topics  . Alcohol use: Not Currently    Comment: socially  . Drug use: Yes    Types: Amphetamines    Prior to Admission medications   Medication Sig Start Date End Date Taking? Authorizing Provider  etonogestrel (NEXPLANON) 68 MG IMPL implant 68 mg by Subdermal route once.  11/28/12   [provider]  HYDROcodone-acetaminophen (NORCO) 5-325 MG tablet Take 1 tablet by mouth every 6 (six) hours as needed for severe pain. 08/15/20   Assyria Morreale, MD  LORazepam (ATIVAN) 0.5 MG tablet Take 1 tablet (0.5 mg total) by mouth every 6 (six) hours as needed for anxiety. 05/26/20   Armandina Stammer I, NP  mirtazapine (REMERON) 7.5 MG tablet Take 1 tablet (7.5 mg total) by mouth at bedtime.  05/26/20 06/25/20  Dagar, Geralynn Rile, MD  nicotine (NICODERM CQ - DOSED IN MG/24 HOURS) 21 mg/24hr patch Place 1 patch (21 mg total) onto the skin daily. 05/27/20   Dagar, Geralynn Rile, MD  venlafaxine XR (EFFEXOR-XR) 75 MG 24 hr capsule Take 1 capsule (75 mg total) by mouth daily with breakfast. 05/27/20 06/26/20  Dagar, Geralynn Rile, MD  doxepin (SINEQUAN) 25 MG capsule Take 1 capsule (25 mg total) by mouth at bedtime and may repeat dose one time if needed. For depression Patient not taking: Reported on 03/10/2019 03/28/16 09/11/19  Armandina Stammer I, NP  famotidine (PEPCID) 20 MG tablet Take 1 tablet (20 mg total) by mouth 2 (two) times daily for 7 days. 09/01/19 09/11/19  Maxwell Caul, PA-C  loratadine (CLARITIN) 10 MG tablet Take 1 tablet (10 mg total) by mouth daily. For allergies Patient not taking: Reported on 03/10/2019 03/28/16 09/11/19  Armandina Stammer I, NP  omeprazole (PRILOSEC) 40 MG capsule Take 1 capsule (40 mg total) by mouth daily. Patient not taking: Reported on 05/20/2020 09/11/19 08/15/20  Averleigh Savary, Jonny Ruiz, MD  potassium chloride SA (KLOR-CON) 20 MEQ tablet Take 1 tablet (20 mEq total) by mouth 2 (two) times daily. Patient not taking: Reported on 05/20/2020 09/11/19 08/15/20  Wilbert Schouten,  Antwion Carpenter, MD    Allergies Stadol [butorphanol tartrate]   REVIEW OF SYSTEMS  Negative except as noted here or in the History of Present Illness.   PHYSICAL EXAMINATION  Initial Vital Signs Blood pressure (!) 137/94, pulse 82, temperature 98 F (36.7 C), temperature source Oral, resp. rate 18, height 5\' 8"  (1.727 m), weight 70.8 kg, SpO2 98 %.  Examination General: Well-developed, well-nourished female in no acute distress; appearance consistent with age of record HENT: normocephalic; atraumatic Eyes: pupils equal, round and reactive to light; extraocular muscles intact Neck: supple Heart: regular rate and rhythm Lungs: clear to auscultation bilaterally Abdomen: soft; nondistended; mild left lower quadrant tenderness;  no masses or hepatosplenomegaly; bowel sounds present Extremities: No deformity; full range of motion; pulses normal Neurologic: Awake, alert and oriented; motor function intact in all extremities and symmetric; no facial droop Skin: Warm and dry Psychiatric: Flat affect   RESULTS  Summary of this visit's results, reviewed and interpreted by myself:   EKG Interpretation  Date/Time:    Ventricular Rate:    PR Interval:    QRS Duration:   QT Interval:    QTC Calculation:   R Axis:     Text Interpretation:        Laboratory Studies: Results for orders placed or performed during the hospital encounter of 08/15/20 (from the past 24 hour(s))  Urinalysis, Routine w reflex microscopic Urine, Clean Catch     Status: Abnormal   Collection Time: 08/15/20  1:27 AM  Result Value Ref Range   Color, Urine YELLOW YELLOW   APPearance CLEAR CLEAR   Specific Gravity, Urine 1.010 1.005 - 1.030   pH 5.0 5.0 - 8.0   Glucose, UA NEGATIVE NEGATIVE mg/dL   Hgb urine dipstick MODERATE (A) NEGATIVE   Bilirubin Urine NEGATIVE NEGATIVE   Ketones, ur NEGATIVE NEGATIVE mg/dL   Protein, ur NEGATIVE NEGATIVE mg/dL   Nitrite NEGATIVE NEGATIVE   Leukocytes,Ua NEGATIVE NEGATIVE   RBC / HPF 0-5 0 - 5 RBC/hpf   WBC, UA 0-5 0 - 5 WBC/hpf   Bacteria, UA NONE SEEN NONE SEEN   Squamous Epithelial / LPF 0-5 0 - 5   Mucus PRESENT   Lipase, blood     Status: None   Collection Time: 08/15/20  2:58 AM  Result Value Ref Range   Lipase 27 11 - 51 U/L  Comprehensive metabolic panel     Status: Abnormal   Collection Time: 08/15/20  2:58 AM  Result Value Ref Range   Sodium 138 135 - 145 mmol/L   Potassium 3.3 (L) 3.5 - 5.1 mmol/L   Chloride 103 98 - 111 mmol/L   CO2 23 22 - 32 mmol/L   Glucose, Bld 90 70 - 99 mg/dL   BUN 10 6 - 20 mg/dL   Creatinine, Ser 08/17/20 0.44 - 1.00 mg/dL   Calcium 9.3 8.9 - 4.17 mg/dL   Total Protein 8.3 (H) 6.5 - 8.1 g/dL   Albumin 4.6 3.5 - 5.0 g/dL   AST 23 15 - 41 U/L   ALT 37 0  - 44 U/L   Alkaline Phosphatase 67 38 - 126 U/L   Total Bilirubin 0.9 0.3 - 1.2 mg/dL   GFR, Estimated 40.8 >14 mL/min   Anion gap 12 5 - 15  CBC     Status: Abnormal   Collection Time: 08/15/20  2:58 AM  Result Value Ref Range   WBC 14.4 (H) 4.0 - 10.5 K/uL   RBC 4.79 3.87 - 5.11  MIL/uL   Hemoglobin 14.2 12.0 - 15.0 g/dL   HCT 16.142.3 36 - 46 %   MCV 88.3 80.0 - 100.0 fL   MCH 29.6 26.0 - 34.0 pg   MCHC 33.6 30.0 - 36.0 g/dL   RDW 09.612.3 04.511.5 - 40.915.5 %   Platelets 481 (H) 150 - 400 K/uL   nRBC 0.0 0.0 - 0.2 %  I-Stat beta hCG blood, ED     Status: None   Collection Time: 08/15/20  3:12 AM  Result Value Ref Range   I-stat hCG, quantitative <5.0 <5 mIU/mL   Comment 3          Respiratory Panel by RT PCR (Flu A&B, Covid) - Nasopharyngeal Swab     Status: None   Collection Time: 08/15/20  4:40 AM   Specimen: Nasopharyngeal Swab  Result Value Ref Range   SARS Coronavirus 2 by RT PCR NEGATIVE NEGATIVE   Influenza A by PCR NEGATIVE NEGATIVE   Influenza B by PCR NEGATIVE NEGATIVE   Imaging Studies: CT ABDOMEN PELVIS W CONTRAST  Result Date: 08/15/2020 CLINICAL DATA:  Left lower quadrant pain EXAM: CT ABDOMEN AND PELVIS WITH CONTRAST TECHNIQUE: Multidetector CT imaging of the abdomen and pelvis was performed using the standard protocol following bolus administration of intravenous contrast. CONTRAST:  100mL OMNIPAQUE IOHEXOL 300 MG/ML  SOLN COMPARISON:  12/26/2019 FINDINGS: LOWER CHEST: Normal. HEPATOBILIARY: Normal hepatic contours. No intra- or extrahepatic biliary dilatation. There is cholelithiasis. PANCREAS: Normal pancreas. No ductal dilatation or peripancreatic fluid collection. SPLEEN: Normal. ADRENALS/URINARY TRACT: The adrenal glands are normal. No hydronephrosis, nephroureterolithiasis or solid renal mass. The urinary bladder is normal for degree of distention STOMACH/BOWEL: There is no hiatal hernia. Normal duodenal course and caliber. No small bowel dilatation or inflammation. No  focal colonic abnormality. Normal appendix. VASCULAR/LYMPHATIC: Normal course and caliber of the major abdominal vessels. No abdominal or pelvic lymphadenopathy. REPRODUCTIVE: 3.5 cm left adnexal cyst fall with mildly thickened wall. Uterus is normal. Normal right adnexa. MUSCULOSKELETAL. No bony spinal canal stenosis or focal osseous abnormality. OTHER: None. IMPRESSION: 1. 3.5 cm left adnexal cyst with mildly thickened wall. This may be a hemorrhagic cyst and could be further evaluated with pelvic ultrasound. 2. Cholelithiasis without specific evidence of acute cholecystitis. Electronically Signed   By: Deatra RobinsonKevin  Herman M.D.   On: 08/15/2020 06:18    ED COURSE and MDM  Nursing notes, initial and subsequent vitals signs, including pulse oximetry, reviewed and interpreted by myself.  Vitals:   08/15/20 0108 08/15/20 0501 08/15/20 0542  BP: (!) 137/94 (!) 123/92 (!) 130/97  Pulse: 82 88 85  Resp: 18 18 18   Temp: 98 F (36.7 C) 98.2 F (36.8 C)   TempSrc: Oral Oral   SpO2: 98% 99% 100%  Weight: 70.8 kg    Height: 5\' 8"  (1.727 m)     Medications  sodium chloride (PF) 0.9 % injection (has no administration in time range)  ondansetron (ZOFRAN) injection 4 mg (4 mg Intravenous Given 08/15/20 0536)  fentaNYL (SUBLIMAZE) injection 100 mcg (100 mcg Intravenous Given 08/15/20 0538)  iohexol (OMNIPAQUE) 300 MG/ML solution 100 mL (100 mLs Intravenous Contrast Given 08/15/20 0551)   Patient advised of CT findings consistent with an ovarian cyst.  She does have [facility] in [facility] with whom she can follow-up.  She was advised further evaluation with ultrasound and possibly even surgery may be indicated.  She states that she has had some vaginal bleeding lately.   PROCEDURES  Procedures  ED DIAGNOSES     ICD-10-CM   1. Cyst of left ovary  N83.202        Paula Libra, MD 08/15/20 [phone]

## 2020-08-15 NOTE — ED Triage Notes (Signed)
Pt reports LUQ pain worsening over 3 days. Endorses nausea, vomiting, and constipation. Reports a hx of cholecystitis and states that she was supposed to have her gallbladder removed months ago, but couldn't due to COVID.

## 2021-02-28 ENCOUNTER — Telehealth: Payer: Self-pay | Admitting: Oncology

## 2021-02-28 NOTE — Telephone Encounter (Signed)
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  Appt made for 03/11/21 Labs 3:30 pm - Consult 4:00 pm

## 2021-03-11 ENCOUNTER — Other Ambulatory Visit: Payer: Self-pay | Admitting: Oncology

## 2021-03-11 ENCOUNTER — Inpatient Hospital Stay: Payer: Medicaid Other | Attending: Oncology

## 2021-03-11 ENCOUNTER — Encounter: Payer: Self-pay | Admitting: Oncology

## 2021-03-11 ENCOUNTER — Inpatient Hospital Stay (INDEPENDENT_AMBULATORY_CARE_PROVIDER_SITE_OTHER): Payer: Medicaid Other | Admitting: Oncology

## 2021-03-11 ENCOUNTER — Other Ambulatory Visit: Payer: Self-pay

## 2021-03-11 DIAGNOSIS — Z801 Family history of malignant neoplasm of trachea, bronchus and lung: Secondary | ICD-10-CM | POA: Diagnosis not present

## 2021-03-11 DIAGNOSIS — L989 Disorder of the skin and subcutaneous tissue, unspecified: Secondary | ICD-10-CM

## 2021-03-11 DIAGNOSIS — Z808 Family history of malignant neoplasm of other organs or systems: Secondary | ICD-10-CM

## 2021-03-11 DIAGNOSIS — D72829 Elevated white blood cell count, unspecified: Secondary | ICD-10-CM

## 2021-03-11 LAB — HEPATIC FUNCTION PANEL
ALT: 37 — AB (ref 7–35)
AST: 33 (ref 13–35)
Alkaline Phosphatase: 102 (ref 25–125)
Bilirubin, Total: 0.2

## 2021-03-11 LAB — COMPREHENSIVE METABOLIC PANEL
Albumin: 4.2 (ref 3.5–5.0)
Calcium: 8.4 — AB (ref 8.7–10.7)

## 2021-03-11 LAB — CBC: RBC: 4.55 (ref 3.87–5.11)

## 2021-03-11 LAB — CBC AND DIFFERENTIAL
HCT: 39 (ref 36–46)
Hemoglobin: 13.1 (ref 12.0–16.0)
Neutrophils Absolute: 5.56
Platelets: 392 (ref 150–399)
WBC: 10.3

## 2021-03-11 LAB — BASIC METABOLIC PANEL
BUN: 9 (ref 4–21)
CO2: 21 (ref 13–22)
Chloride: 108 (ref 99–108)
Creatinine: 0.6 (ref 0.5–1.1)
Glucose: 99
Potassium: 3.9 (ref 3.4–5.3)
Sodium: 137 (ref 137–147)

## 2021-03-11 NOTE — Progress Notes (Signed)
[facility]  [address] [place],  [place]  [address] [phone]  Clinic Day:  03/11/2021  Referring physician: Tanna Furry, PA-C   HISTORY OF PRESENT ILLNESS:  The patient is a 35 y.o. female  who I was asked to consult upon for leukocytosis.   Recent labs showed an elevated white count of 13.7.  The patient readily admits she has a long history of smoking, which includes illicit drugs.  However, she claims to have stopped smoking everything for nearly the past few weeks.  There is no history of a recent infection.  She denies steroid use. She denies having undergone a splenectomy.  To her knowledge, there is no family history of leukocytosis or other hematologic disorders.    PAST MEDICAL HISTORY:   Past Medical History:  Diagnosis Date  . Gallstones   . Kidney stone   . Ovarian cyst   . PUD (peptic ulcer disease)     PAST SURGICAL HISTORY:   Past Surgical History:  Procedure Laterality Date  . LITHOTRIPSY      CURRENT MEDICATIONS:   Current Outpatient Medications  Medication Sig Dispense Refill  . amphetamine-dextroamphetamine (ADDERALL) 20 MG tablet Take by mouth.    . etonogestrel (NEXPLANON) 68 MG IMPL implant 68 mg by Subdermal route once.     . venlafaxine XR (EFFEXOR-XR) 75 MG 24 hr capsule Take 1 capsule (75 mg total) by mouth daily with breakfast. 30 capsule 0   No current facility-administered medications for this visit.    ALLERGIES:   Allergies  Allergen Reactions  . Stadol [Butorphanol Tartrate] Other (See Comments)    Low heart rate    FAMILY HISTORY:   Family History  Problem Relation Age of Onset  . Endometrial cancer Mother   . Bipolar disorder Sister   . Hypertension Father   . Heart attack Father   Her maternal grandmother and a maternal aunt both died from lung cancer.  SOCIAL HISTORY:  The patient was born and raised in [place]. She [place] the [place].  She has 3  children.  She is currently unemployed.  She smoked a pack of cigarettes daily for 15 years before quitting 3 weeks ago.  She quit smoking methamphetamines 2 months ago.  She quit her heavy alcohol use 3 years ago.    REVIEW OF SYSTEMS:  Review of Systems  Constitutional: Negative for fatigue and fever.       Night sweats, insomnia  HENT:   Negative for hearing loss and sore throat.   Eyes: Negative for eye problems.  Respiratory: Positive for shortness of breath. Negative for chest tightness, cough and hemoptysis.   Cardiovascular: Negative for chest pain and palpitations.  Gastrointestinal: Positive for nausea. Negative for abdominal distention, abdominal pain, blood in stool, constipation, diarrhea and vomiting.  Endocrine: Negative for hot flashes.  Genitourinary: Negative for difficulty urinating, dysuria, frequency, hematuria and nocturia.   Musculoskeletal: Positive for arthralgias. Negative for back pain, gait problem and myalgias.  Skin: Positive for rash. Negative for itching.  Neurological: Positive for headaches. Negative for dizziness, extremity weakness, gait problem, light-headedness and numbness.  Hematological: Negative.   Psychiatric/Behavioral: Positive for depression. Negative for suicidal ideas. The patient is nervous/anxious.      PHYSICAL EXAM:  Blood pressure 122/76, pulse 99, temperature 98.4 F (36.9 C), resp. rate 14, height 5\' 7"  (1.702 m), weight 157 lb 11.2 oz (71.5 kg), SpO2 97 %. Wt Readings from Last 3 Encounters:  03/11/21 157 lb 11.2 oz (71.5 kg)  08/15/20 156 lb (70.8 kg)  09/11/19 158 lb (71.7 kg)   Body mass index is 24.7 kg/m. Performance status (ECOG): 0 - Asymptomatic Physical Exam Constitutional:      Appearance: Normal appearance. She is not ill-appearing.     Comments: Mildly tremulous, nervous  HENT:     Mouth/Throat:     Mouth: Mucous membranes are moist.     Pharynx: Oropharynx is clear. No oropharyngeal exudate or posterior  oropharyngeal erythema.  Cardiovascular:     Rate and Rhythm: Normal rate and regular rhythm.     Heart sounds: No murmur heard. No friction rub. No gallop.   Pulmonary:     Effort: Pulmonary effort is normal. No respiratory distress.     Breath sounds: Normal breath sounds. No wheezing, rhonchi or rales.  Chest:  Breasts:     Right: No axillary adenopathy or supraclavicular adenopathy.     Left: No axillary adenopathy or supraclavicular adenopathy.    Abdominal:     General: Bowel sounds are normal. There is no distension.     Palpations: Abdomen is soft. There is no mass.     Tenderness: There is no abdominal tenderness.  Musculoskeletal:        General: No swelling.     Right lower leg: No edema.     Left lower leg: No edema.  Lymphadenopathy:     Cervical: No cervical adenopathy.     Upper Body:     Right upper body: No supraclavicular or axillary adenopathy.     Left upper body: No supraclavicular or axillary adenopathy.     Lower Body: No right inguinal adenopathy. No left inguinal adenopathy.  Skin:    General: Skin is warm.     Coloration: Skin is not jaundiced.     Findings: Lesion and rash present.  Neurological:     General: No focal deficit present.     Mental Status: She is alert and oriented to person, place, and time. Mental status is at baseline.     Cranial Nerves: Cranial nerves are intact.  Psychiatric:        Mood and Affect: Mood normal.        Behavior: Behavior normal.        Thought Content: Thought content normal.    LABS:       ASSESSMENT & PLAN:  A 35 y.o. female who I was asked to consult upon for leukocytosis.  When evaluating her labs today, her CBC is completely normal.  Not only has her white count normalized, but her white count differential, hemoglobin and platelets are also normal.  I do not think it is a coincidence that her leukocytosis has normalized since she has quit smoking cigarettes or other illicit drugs.  I showed her this  reflects how things can normalize in her life with the discontinuation of drug use.  She voiced understanding of this.  Due to her sporadic skin lesions/rashes, the patient was worried about having underlying viral illnesses.  Fortunately, both HIV and hepatitis C testing came back normal.  As this patient has no underlying hematologic issues, I do feel comfortable turning her care back over to her primary care office.  Her CBC only needs to be checked on an as-needed basis.  The patient understands all the plans discussed today and is in agreement with them.  I do appreciate Tanna Furry, PA-C for his new consult.   Ghislaine Harcum Kirby Funk, MD

## 2021-03-13 DIAGNOSIS — D72829 Elevated white blood cell count, unspecified: Secondary | ICD-10-CM | POA: Insufficient documentation

## 2021-03-21 ENCOUNTER — Encounter: Payer: Self-pay | Admitting: Oncology

## 2021-10-13 ENCOUNTER — Telehealth: Payer: Self-pay | Admitting: Oncology

## 2021-10-13 NOTE — Telephone Encounter (Signed)
Per 5/13 LOS, care returned to PCP

## 2021-10-14 ENCOUNTER — Encounter: Payer: Self-pay | Admitting: Hematology and Oncology

## 2021-10-14 ENCOUNTER — Inpatient Hospital Stay: Payer: Medicaid Other | Attending: Hematology and Oncology | Admitting: Hematology and Oncology

## 2021-10-14 ENCOUNTER — Inpatient Hospital Stay: Payer: Medicaid Other

## 2021-10-14 DIAGNOSIS — M899 Disorder of bone, unspecified: Secondary | ICD-10-CM

## 2021-10-14 DIAGNOSIS — Z79899 Other long term (current) drug therapy: Secondary | ICD-10-CM | POA: Insufficient documentation

## 2021-10-14 DIAGNOSIS — D72829 Elevated white blood cell count, unspecified: Secondary | ICD-10-CM | POA: Insufficient documentation

## 2021-10-14 LAB — BASIC METABOLIC PANEL
BUN: 17 (ref 4–21)
CO2: 24 — AB (ref 13–22)
Chloride: 106 (ref 99–108)
Creatinine: 0.6 (ref 0.5–1.1)
Glucose: 122
Potassium: 3.6 (ref 3.4–5.3)
Sodium: 138 (ref 137–147)

## 2021-10-14 LAB — CBC AND DIFFERENTIAL
HCT: 44 (ref 36–46)
Hemoglobin: 14.8 (ref 12.0–16.0)
Neutrophils Absolute: 7.81
Platelets: 528 — AB (ref 150–399)
WBC: 12.2

## 2021-10-14 LAB — HEPATIC FUNCTION PANEL
ALT: 46 — AB (ref 7–35)
AST: 38 — AB (ref 13–35)
Alkaline Phosphatase: 88 (ref 25–125)
Bilirubin, Total: 0.6

## 2021-10-14 LAB — CBC: RBC: 5.03 (ref 3.87–5.11)

## 2021-10-14 LAB — COMPREHENSIVE METABOLIC PANEL WITH GFR
Albumin: 4.2 (ref 3.5–5.0)
Calcium: 8.6 — AB (ref 8.7–10.7)

## 2021-10-14 NOTE — Assessment & Plan Note (Addendum)
She has a history of leukocytosis here for new abnormality in the area of the lower lateral body of the left scapula found on bone scan. Imaging results, however are inconclusive and radiologist recommends CT imaging for better evaluation. She notes over the last several months fatigue, generalized weakness, night sweats, nausea, headaches and itching. She states her PCP performed an SPEP recently with "elevations"; however, these results are not available for review. CBC today reveals white count of 12.2, hemoglobin 14.8, platelets 528 and eosinophils 6.1. We will repeat her SPEP today. I will order CT chest/ abdomen/ pelvis and follow up with Dr. Melvyn Neth once imaging has been obtained.

## 2021-10-14 NOTE — Progress Notes (Cosign Needed)
Patient Care Team: Renaldo Reel, PA as PCP - General (Family Medicine)  Clinic Day:  10/14/2021  Referring physician: Jeanie Sewer, NP  ASSESSMENT & PLAN:   Assessment & Plan: Bone lesion She has a history of leukocytosis here for new abnormality in the area of the lower lateral body of the left scapula found on bone scan. Imaging results, however are inconclusive and radiologist recommends CT imaging for better evaluation. She notes over the last several months fatigue, generalized weakness, night sweats, nausea, headaches and itching. She states her PCP performed an SPEP recently with "elevations"; however, these results are not available for review. CBC today reveals white count of 12.2, hemoglobin 14.8, platelets 528 and eosinophils 6.1. We will repeat her SPEP today. I will order CT chest/ abdomen/ pelvis and follow up with Dr. Bobby Rumpf once imaging has been obtained.    The patient understands the plans discussed today and is in agreement with them.  She knows to contact our office if she develops concerns prior to her next appointment.    Melodye Ped, NP  [facility] AT [facility] [address] [place] [address] Dept: [phone] Dept Fax: [phone]   Orders Placed This Encounter  Procedures   CT CHEST ABDOMEN PELVIS W CONTRAST    Standing Status:   Future    Standing Expiration Date:   10/14/2022    Order Specific Question:   Is patient pregnant?    Answer:   No    Order Specific Question:   Preferred imaging location?    Answer:   External    Order Specific Question:   Is Oral Contrast requested for this exam?    Answer:   Yes, Per Radiology protocol   CBC w Diff ([facility] scanned report) STAT    Standing Status:   Future    Number of Occurrences:   1    Standing Expiration Date:   10/14/2022   CMP ([facility] scanned report) STAT    Standing Status:   Future    Number of Occurrences:   1     Standing Expiration Date:   10/14/2022   Multiple Myeloma Panel (SPEP&IFE w/QIG)    Standing Status:   Future    Number of Occurrences:   1    Standing Expiration Date:   10/14/2022   CBC and differential    This external order was created through the Results Console.   CBC    This external order was created through the Results Console.   Basic metabolic panel    This external order was created through the Results Console.   Comprehensive metabolic panel    This external order was created through the Results Console.   Hepatic function panel    This external order was created through the Results Console.      CHIEF COMPLAINT:  CC: A 35 year old female with history of leukocytosis here for evaluation of abnormality on bone scan  Current Treatment:  Diagnostics  INTERVAL HISTORY:  Kathryn Holland is here today for repeat clinical assessment. She denies fevers or chills. She denies pain. Her appetite is good. Her weight has been stable.  I have reviewed the past medical history, past surgical history, social history and family history with the patient and they are unchanged from previous note.  ALLERGIES:  is allergic to butorphanol and stadol [butorphanol tartrate].  MEDICATIONS:  Current Outpatient Medications  Medication Sig Dispense Refill   DULoxetine (CYMBALTA)  60 MG capsule Take 60 mg by mouth daily.     potassium chloride SA (KLOR-CON M) 20 MEQ tablet Take by mouth.     Cholecalciferol 25 MCG (1000 UT) tablet Take by mouth.     etonogestrel (NEXPLANON) 68 MG IMPL implant 68 mg by Subdermal route once.      meloxicam (MOBIC) 15 MG tablet meloxicam 15 mg tablet  Take 1 tablet every day by oral route.     ondansetron (ZOFRAN-ODT) 4 MG disintegrating tablet Take by mouth. (Patient not taking: Reported on 10/14/2021)     No current facility-administered medications for this visit.    HISTORY OF PRESENT ILLNESS:   Oncology History   No history exists.      REVIEW OF SYSTEMS:    Constitutional: Denies fevers, chills or abnormal weight loss Eyes: Denies blurriness of vision Ears, nose, mouth, throat, and face: Denies mucositis or sore throat Respiratory: Denies cough, dyspnea or wheezes Cardiovascular: Denies palpitation, chest discomfort or lower extremity swelling Gastrointestinal:  Denies nausea, heartburn or change in bowel habits Skin: Denies abnormal skin rashes Lymphatics: Denies new lymphadenopathy or easy bruising Neurological:Denies numbness, tingling or new weaknesses Behavioral/Psych: Mood is stable, no new changes  All other systems were reviewed with the patient and are negative.   VITALS:  Blood pressure 131/83, pulse (!) 102, temperature 98.3 F (36.8 C), temperature source Oral, resp. rate 18, height _0  (1.702 m), weight 179 lb 3.2 oz (81.3 kg), SpO2 96 %.  Wt Readings from Last 3 Encounters:  10/14/21 179 lb 3.2 oz (81.3 kg)  03/11/21 157 lb 11.2 oz (71.5 kg)  08/15/20 156 lb (70.8 kg)    Body mass index is 28.07 kg/m.  Performance status (ECOG): 1 - Symptomatic but completely ambulatory  PHYSICAL EXAM:   GENERAL:alert, no distress and comfortable SKIN: skin color, texture, turgor are normal, no rashes or significant lesions EYES: normal, Conjunctiva are pink and non-injected, sclera clear OROPHARYNX:no exudate, no erythema and lips, buccal mucosa, and tongue normal  NECK: supple, thyroid normal size, non-tender, without nodularity LYMPH:  no palpable lymphadenopathy in the cervical, axillary or inguinal LUNGS: clear to auscultation and percussion with normal breathing effort HEART: regular rate & rhythm and no murmurs and no lower extremity edema ABDOMEN:abdomen soft, non-tender and normal bowel sounds Musculoskeletal:no cyanosis of digits and no clubbing  NEURO: alert & oriented x 3 with fluent speech, no focal motor/sensory deficits  LABORATORY DATA:  I have reviewed the data as listed    Component Value Date/Time   NA  138 10/14/2021 0000   K 3.6 10/14/2021 0000   CL 106 10/14/2021 0000   CO2 24 (A) 10/14/2021 0000   GLUCOSE 90 08/15/2020 0258   BUN 17 10/14/2021 0000   CREATININE 0.6 10/14/2021 0000   CREATININE 0.71 08/15/2020 0258   CALCIUM 8.6 (A) 10/14/2021 0000   PROT 8.3 (H) 08/15/2020 0258   ALBUMIN 4.2 10/14/2021 0000   AST 38 (A) 10/14/2021 0000   ALT 46 (A) 10/14/2021 0000   ALKPHOS 88 10/14/2021 0000   BILITOT 0.9 08/15/2020 0258   GFRNONAA >60 08/15/2020 0258   GFRAA >60 05/24/2020 0654    No results found for: SPEP, UPEP  Lab Results  Component Value Date   WBC 12.2 10/14/2021   NEUTROABS 7.81 10/14/2021   HGB 14.8 10/14/2021   HCT 44 10/14/2021   MCV 88.3 08/15/2020   PLT 528 (A) 10/14/2021      Chemistry      Component  Value Date/Time   NA 138 10/14/2021 0000   K 3.6 10/14/2021 0000   CL 106 10/14/2021 0000   CO2 24 (A) 10/14/2021 0000   BUN 17 10/14/2021 0000   CREATININE 0.6 10/14/2021 0000   CREATININE 0.71 08/15/2020 0258   GLU 122 10/14/2021 0000      Component Value Date/Time   CALCIUM 8.6 (A) 10/14/2021 0000   ALKPHOS 88 10/14/2021 0000   AST 38 (A) 10/14/2021 0000   ALT 46 (A) 10/14/2021 0000   BILITOT 0.9 08/15/2020 0258       RADIOGRAPHIC STUDIES: I have personally reviewed the radiological images as listed and agreed with the findings in the report. No results found.

## 2021-10-16 IMAGING — CT CT ABD-PELV W/ CM
2 of 4 series · 16 of 46 positions shown, 18 images · IV contrast (omnipaque)
Comparison: 12/26/2019

CLINICAL DATA: Left lower quadrant pain

EXAM:
CT ABDOMEN AND PELVIS WITH CONTRAST
TECHNIQUE: Multidetector CT imaging of the abdomen and pelvis was performed
using the standard protocol following bolus administration of
intravenous contrast.
CONTRAST:  100mL OMNIPAQUE IOHEXOL 300 MG/ML  SOLN

[Series 2: axial st · axial · 0.76mm/px · z∈[-501,-101]mm · 13 of 91 slices shown, 15 images]
[im 6/91  soft-tissue]
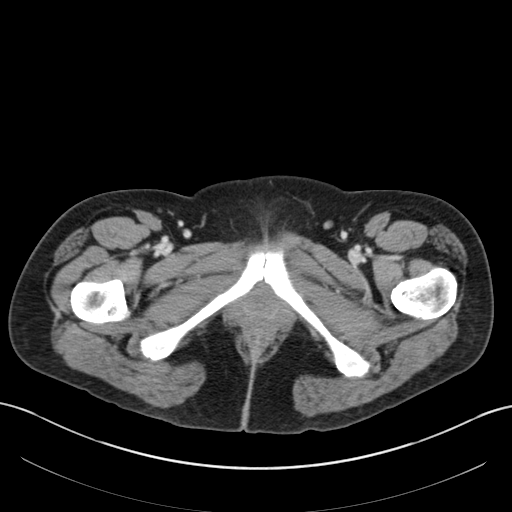
[im 6/91  bone]
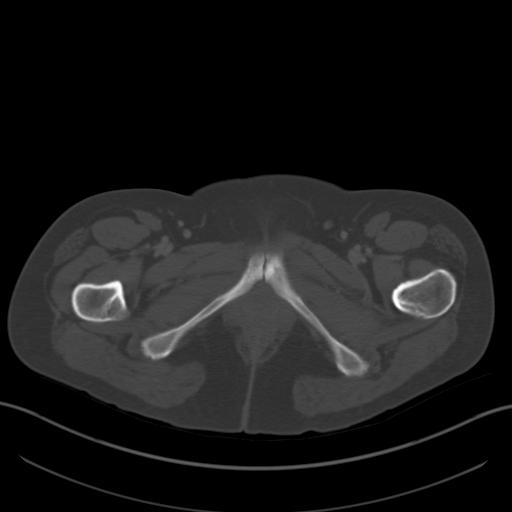
[im 11/91  soft-tissue]
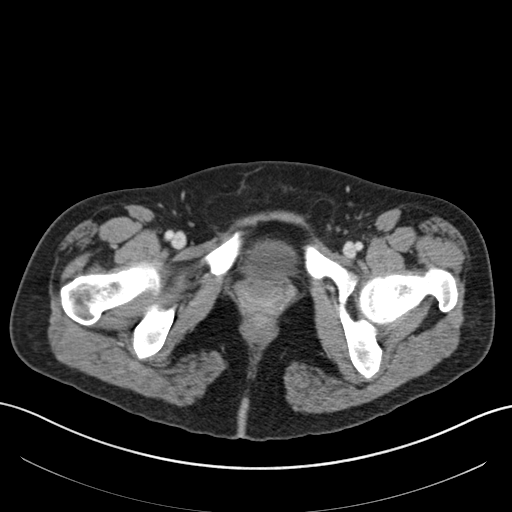
[im 21/91  soft-tissue]
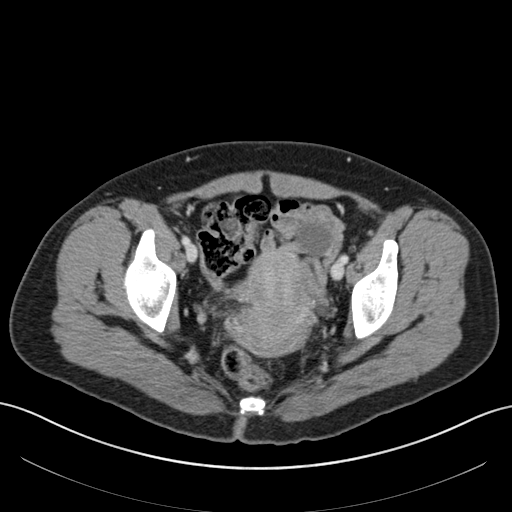
[im 26/91  soft-tissue]
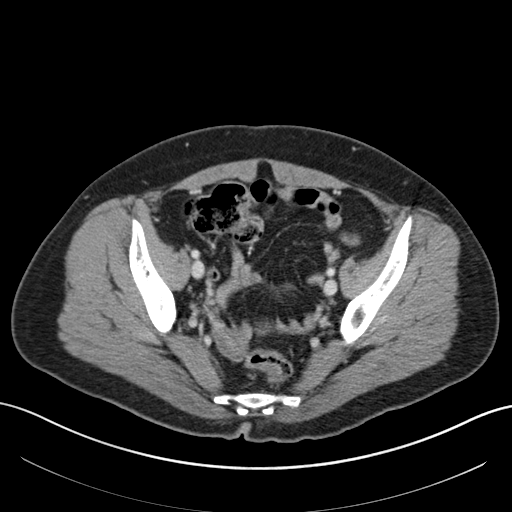
[im 31/91  soft-tissue]
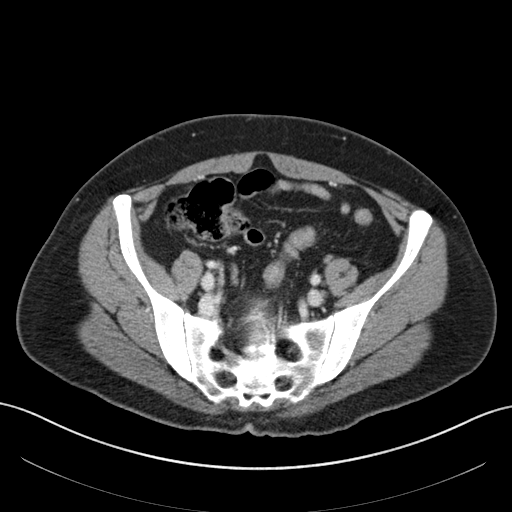
[im 41/91  soft-tissue]
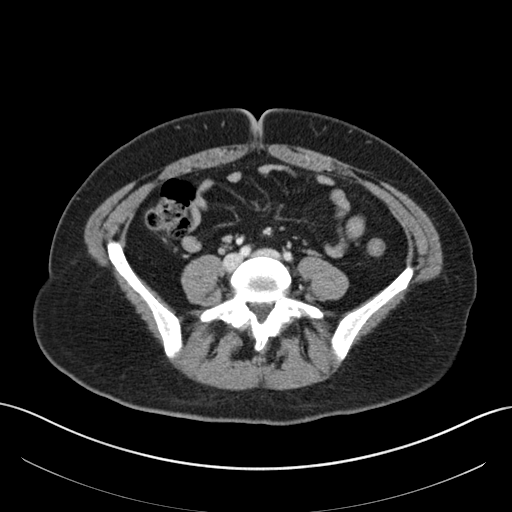
[im 46/91  soft-tissue]
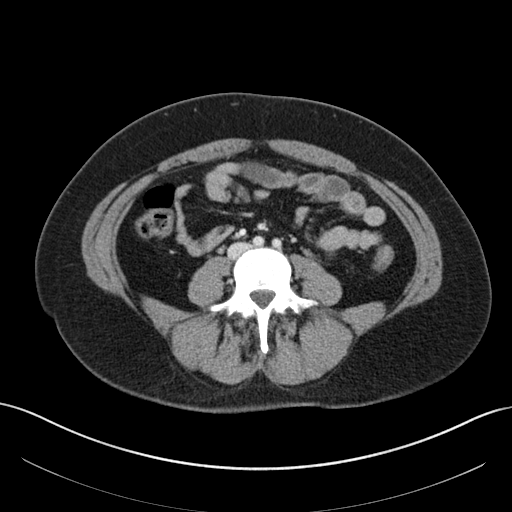
[im 51/91  soft-tissue]
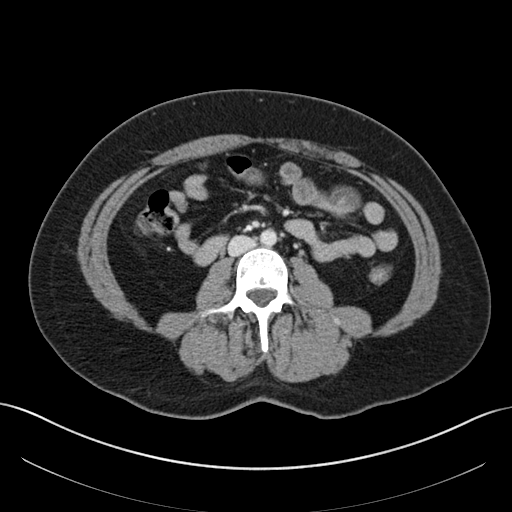
[im 61/91  soft-tissue]
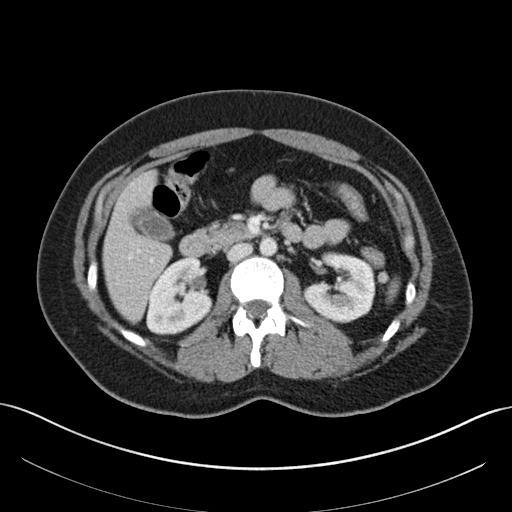
[im 61/91  bone]
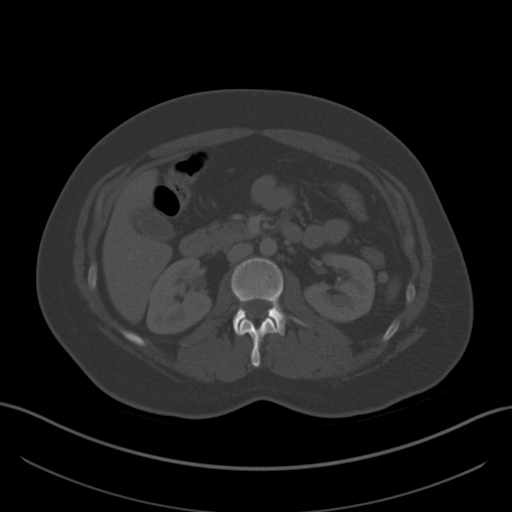
[im 66/91  soft-tissue]
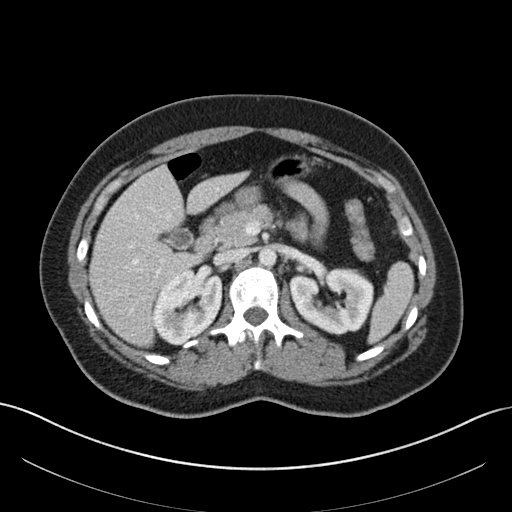
[im 71/91  soft-tissue]
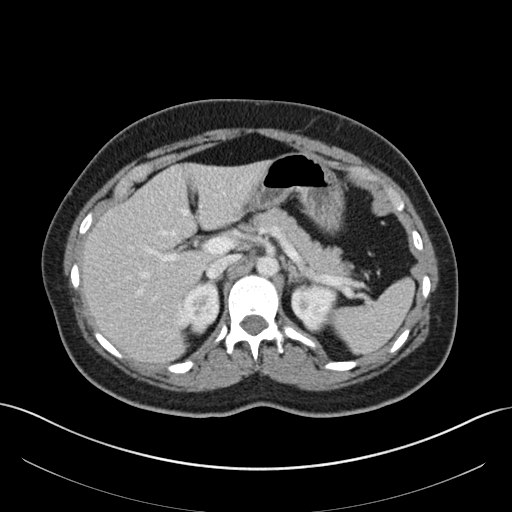
[im 81/91  soft-tissue]
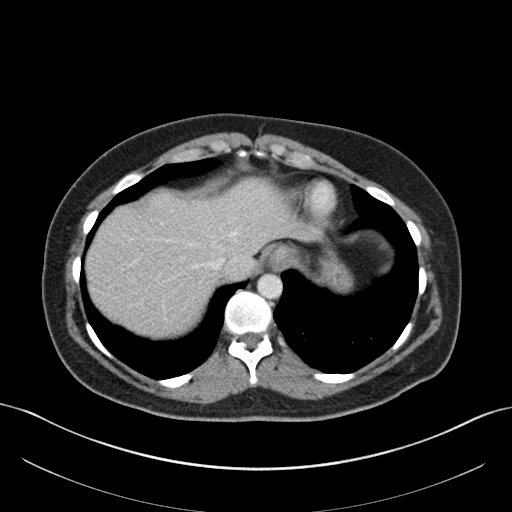
[im 86/91  soft-tissue]
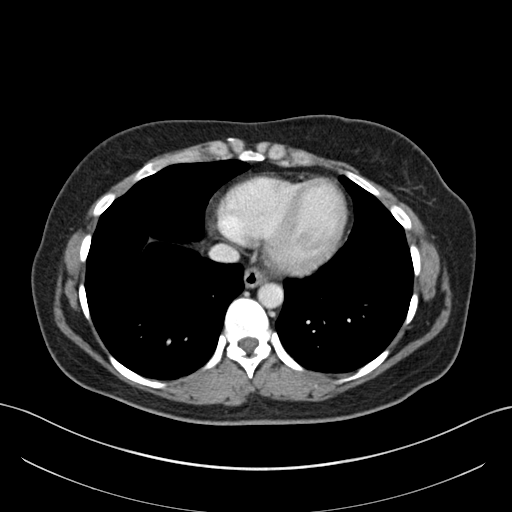

[Series 5: coronal st · coronal · 0.71mm/px · 3 of 144 slices shown]
[im 48/144  soft-tissue]
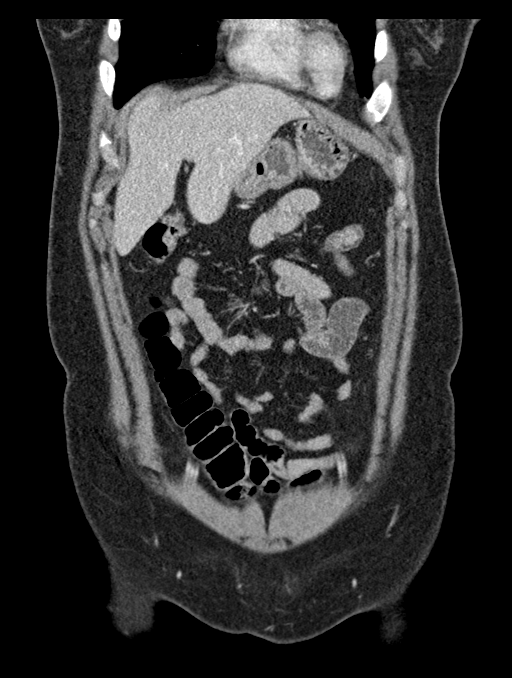
[im 64/144  soft-tissue]
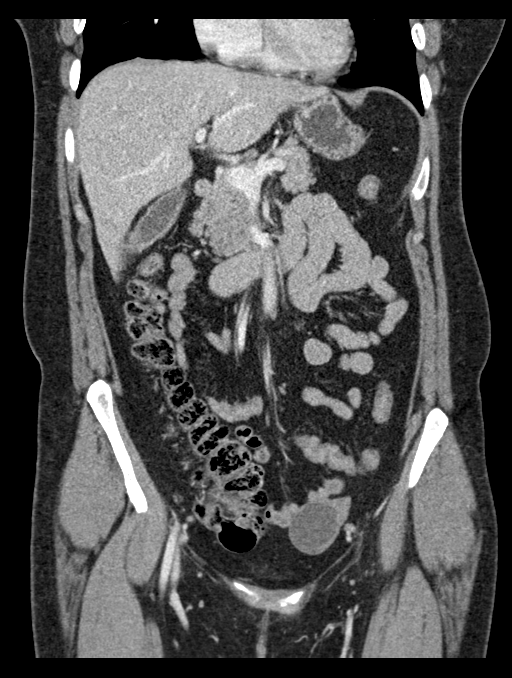
[im 80/144  soft-tissue]
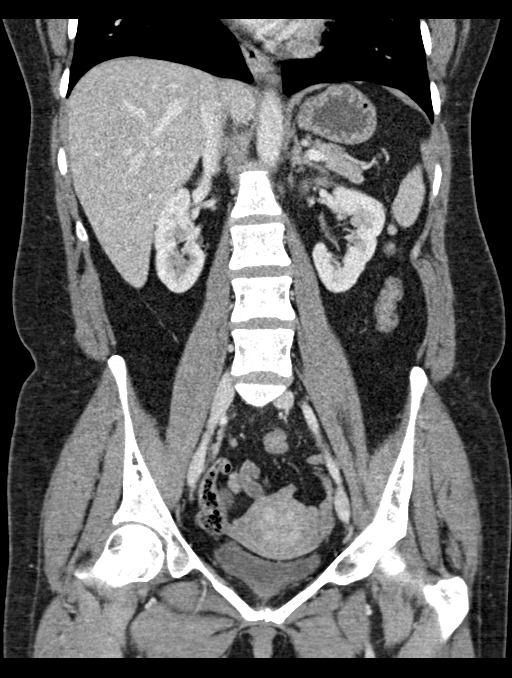

[16 of 46 positions shown; findings below may reference images not displayed]

FINDINGS: LOWER CHEST: Normal.

HEPATOBILIARY: Normal hepatic contours. No intra- or extrahepatic
biliary dilatation. There is cholelithiasis.

PANCREAS: Normal pancreas. No ductal dilatation or peripancreatic
fluid collection.

SPLEEN: Normal.

ADRENALS/URINARY TRACT: The adrenal glands are normal. No
hydronephrosis, nephroureterolithiasis or solid renal mass. The
urinary bladder is normal for degree of distention

STOMACH/BOWEL: There is no hiatal hernia. Normal duodenal course and
caliber. No small bowel dilatation or inflammation. No focal colonic
abnormality. Normal appendix.

VASCULAR/LYMPHATIC: Normal course and caliber of the major abdominal
vessels. No abdominal or pelvic lymphadenopathy.

REPRODUCTIVE: 3.5 cm left adnexal cyst fall with mildly thickened
wall. Uterus is normal. Normal right adnexa.

MUSCULOSKELETAL. No bony spinal canal stenosis or focal osseous
abnormality.

OTHER: None.
IMPRESSION: 1. 3.5 cm left adnexal cyst with mildly thickened wall. This may be
a hemorrhagic cyst and could be further evaluated with pelvic
ultrasound.
2. Cholelithiasis without specific evidence of acute cholecystitis.

## 2021-10-19 ENCOUNTER — Telehealth: Payer: Self-pay | Admitting: Hematology and Oncology

## 2021-10-19 NOTE — Telephone Encounter (Signed)
Patient notified of scheduled 12/29 CT Scans checking in at [facility] 2:30 pm.  Informed patient to pick up Contrast before 4:30 pm by 12/28 at [facility] instructions.  Also gave patient 1/3 Follow Up Appt w/[place]

## 2021-10-20 LAB — MULTIPLE MYELOMA PANEL, SERUM
Albumin SerPl Elph-Mcnc: 3.8 g/dL (ref 2.9–4.4)
Albumin/Glob SerPl: 1.2 (ref 0.7–1.7)
Alpha 1: 0.2 g/dL (ref 0.0–0.4)
Alpha2 Glob SerPl Elph-Mcnc: 0.9 g/dL (ref 0.4–1.0)
B-Globulin SerPl Elph-Mcnc: 1.3 g/dL (ref 0.7–1.3)
Gamma Glob SerPl Elph-Mcnc: 0.8 g/dL (ref 0.4–1.8)
Globulin, Total: 3.3 g/dL (ref 2.2–3.9)
IgA: 352 mg/dL (ref 87–352)
IgG (Immunoglobin G), Serum: 1012 mg/dL (ref 586–1602)
IgM (Immunoglobulin M), Srm: 84 mg/dL (ref 26–217)
Total Protein ELP: 7.1 g/dL (ref 6.0–8.5)

## 2021-11-01 ENCOUNTER — Ambulatory Visit: Payer: Medicaid Other | Admitting: Hematology and Oncology

## 2021-11-17 ENCOUNTER — Other Ambulatory Visit: Payer: Self-pay | Admitting: Hematology and Oncology

## 2021-11-17 NOTE — Progress Notes (Unsigned)
Received phone call from Graylon Gunning, PA-C to review CT C/A/P. 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 Patient reports generalized bone pain not relieved with duloxetine or gabapentin. Kathryn Holland is at a loss as to how to relieve her pain due to h/o drug abuse.  The patient has mildly elevated white blood cell count and platelets. Advised Kathryn Holland this could represent a myeloproliferative syndrome, possibly causing generalized bone pain, but that is a stretch.  She is scheduled to see Dr. Melvyn Neth on January 23 and we will await his input.

## 2021-11-18 ENCOUNTER — Other Ambulatory Visit: Payer: Self-pay | Admitting: Hematology and Oncology

## 2021-11-18 DIAGNOSIS — M899 Disorder of bone, unspecified: Secondary | ICD-10-CM

## 2021-11-20 NOTE — Progress Notes (Incomplete)
Patient Care Team: Marlyn Corporal, [place] as PCP - General (Family Medicine)  Clinic Day:  11/20/2021  Referring physician: Marlyn Corporal, PA  ASSESSMENT & PLAN:   Assessment & Plan: No problem-specific Assessment & Plan notes found for this encounter.    The patient understands the plans discussed today and is in agreement with them.  She knows to contact our office if she develops concerns prior to her next appointment.    Weston Settle, MD  [facility] AT [facility] [address] [place] [address] Dept: [phone] Dept Fax: [phone]   No orders of the defined types were placed in this encounter.     CHIEF COMPLAINT:  CC: A 36 year old female with history of leukocytosis here for evaluation of abnormality on bone scan  Current Treatment:  Diagnostics  INTERVAL HISTORY:  Kathryn Holland is here today for repeat clinical assessment. She denies fevers or chills. She denies pain. Her appetite is good. Her weight has been stable.  I have reviewed the past medical history, past surgical history, social history and family history with the patient and they are unchanged from previous note.  ALLERGIES:  is allergic to butorphanol and stadol [butorphanol tartrate].  MEDICATIONS:  Current Outpatient Medications  Medication Sig Dispense Refill   Cholecalciferol 25 MCG (1000 UT) tablet Take by mouth.     DULoxetine (CYMBALTA) 60 MG capsule Take 60 mg by mouth daily.     etonogestrel (NEXPLANON) 68 MG IMPL implant 68 mg by Subdermal route once.      meloxicam (MOBIC) 15 MG tablet meloxicam 15 mg tablet  Take 1 tablet every day by oral route.     ondansetron (ZOFRAN-ODT) 4 MG disintegrating tablet Take by mouth. (Patient not taking: Reported on 10/14/2021)     potassium chloride SA (KLOR-CON M) 20 MEQ tablet Take by mouth.     No current facility-administered medications for this visit.    HISTORY OF PRESENT ILLNESS:    Oncology History   No history exists.      REVIEW OF SYSTEMS:   Constitutional: Denies fevers, chills or abnormal weight loss Eyes: Denies blurriness of vision Ears, nose, mouth, throat, and face: Denies mucositis or sore throat Respiratory: Denies cough, dyspnea or wheezes Cardiovascular: Denies palpitation, chest discomfort or lower extremity swelling Gastrointestinal:  Denies nausea, heartburn or change in bowel habits Skin: Denies abnormal skin rashes Lymphatics: Denies new lymphadenopathy or easy bruising Neurological:Denies numbness, tingling or new weaknesses Behavioral/Psych: Mood is stable, no new changes  All other systems were reviewed with the patient and are negative.   VITALS:  There were no vitals taken for this visit.  Wt Readings from Last 3 Encounters:  10/14/21 179 lb 3.2 oz (81.3 kg)  03/11/21 157 lb 11.2 oz (71.5 kg)  08/15/20 156 lb (70.8 kg)    There is no height or weight on file to calculate BMI.  Performance status (ECOG): 1 - Symptomatic but completely ambulatory  PHYSICAL EXAM:   GENERAL:alert, no distress and comfortable SKIN: skin color, texture, turgor are normal, no rashes or significant lesions EYES: normal, Conjunctiva are pink and non-injected, sclera clear OROPHARYNX:no exudate, no erythema and lips, buccal mucosa, and tongue normal  NECK: supple, thyroid normal size, non-tender, without nodularity LYMPH:  no palpable lymphadenopathy in the cervical, axillary or inguinal LUNGS: clear to auscultation and percussion with normal breathing effort HEART: regular rate & rhythm and no murmurs and no lower extremity edema ABDOMEN:abdomen soft,  non-tender and normal bowel sounds Musculoskeletal:no cyanosis of digits and no clubbing  NEURO: alert & oriented x 3 with fluent speech, no focal motor/sensory deficits  LABORATORY DATA:  I have reviewed the data as listed    Component Value Date/Time   NA 138 10/14/2021 0000   K 3.6 10/14/2021  0000   CL 106 10/14/2021 0000   CO2 24 (A) 10/14/2021 0000   GLUCOSE 90 08/15/2020 0258   BUN 17 10/14/2021 0000   CREATININE 0.6 10/14/2021 0000   CREATININE 0.71 08/15/2020 0258   CALCIUM 8.6 (A) 10/14/2021 0000   PROT 8.3 (H) 08/15/2020 0258   ALBUMIN 4.2 10/14/2021 0000   AST 38 (A) 10/14/2021 0000   ALT 46 (A) 10/14/2021 0000   ALKPHOS 88 10/14/2021 0000   BILITOT 0.9 08/15/2020 0258   GFRNONAA >60 08/15/2020 0258   GFRAA >60 05/24/2020 0654    No results found for: SPEP, UPEP  Lab Results  Component Value Date   WBC 12.2 10/14/2021   NEUTROABS 7.81 10/14/2021   HGB 14.8 10/14/2021   HCT 44 10/14/2021   MCV 88.3 08/15/2020   PLT 528 (A) 10/14/2021      Chemistry      Component Value Date/Time   NA 138 10/14/2021 0000   K 3.6 10/14/2021 0000   CL 106 10/14/2021 0000   CO2 24 (A) 10/14/2021 0000   BUN 17 10/14/2021 0000   CREATININE 0.6 10/14/2021 0000   CREATININE 0.71 08/15/2020 0258   GLU 122 10/14/2021 0000      Component Value Date/Time   CALCIUM 8.6 (A) 10/14/2021 0000   ALKPHOS 88 10/14/2021 0000   AST 38 (A) 10/14/2021 0000   ALT 46 (A) 10/14/2021 0000   BILITOT 0.9 08/15/2020 0258       RADIOGRAPHIC STUDIES: I have personally reviewed the radiological images as listed and agreed with the findings in the report. No results found.

## 2021-11-21 ENCOUNTER — Other Ambulatory Visit: Payer: Self-pay | Admitting: Oncology

## 2021-11-21 ENCOUNTER — Inpatient Hospital Stay: Payer: Medicaid Other

## 2021-11-21 ENCOUNTER — Telehealth: Payer: Self-pay | Admitting: Oncology

## 2021-11-21 ENCOUNTER — Other Ambulatory Visit: Payer: Self-pay | Admitting: Hematology and Oncology

## 2021-11-21 ENCOUNTER — Other Ambulatory Visit: Payer: Self-pay

## 2021-11-21 ENCOUNTER — Inpatient Hospital Stay: Payer: Medicaid Other | Attending: Hematology and Oncology | Admitting: Oncology

## 2021-11-21 VITALS — BP 129/84 | HR 117 | Temp 98.1°F | Resp 18 | Ht 67.0 in | Wt 184.7 lb

## 2021-11-21 DIAGNOSIS — D72823 Leukemoid reaction: Secondary | ICD-10-CM

## 2021-11-21 DIAGNOSIS — M899 Disorder of bone, unspecified: Secondary | ICD-10-CM

## 2021-11-21 LAB — CBC AND DIFFERENTIAL
HCT: 42 (ref 36–46)
Hemoglobin: 13.9 (ref 12.0–16.0)
Neutrophils Absolute: 12.64
Platelets: 524 — AB (ref 150–399)
WBC: 17.8

## 2021-11-21 LAB — CBC
MCV: 86 (ref 81–99)
RBC: 4.85 (ref 3.87–5.11)

## 2021-11-21 NOTE — Progress Notes (Signed)
[facility]  [address] [place],  [place]  [address] [phone]  Clinic Day:  03/11/2021  Referring physician: Marlyn Corporal, PA   HISTORY OF PRESENT ILLNESS:  The patient is a 36 y.o. female who I have been following for leukocytosis.   However, over these past weeks, she has complained of diffuse bodily/bone pain.  She has had both a bone scan and CT scans which showed no evidence of an occult malignancy or other diffuse osseous process.  The major issue that appears to impact this patient the most is her drug use.  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  She claims she can quit at any time, but still finds ways to relapse.     PHYSICAL EXAM:  Blood pressure 129/84, pulse (!) 117, temperature 98.1 F (36.7 C), resp. rate 18, height 5\' 7"  (1.702 m), weight 184 lb 11.2 oz (83.8 kg), SpO2 98 %. Wt Readings from Last 3 Encounters:  11/21/21 184 lb 11.2 oz (83.8 kg)  10/14/21 179 lb 3.2 oz (81.3 kg)  03/11/21 157 lb 11.2 oz (71.5 kg)   Body mass index is 28.93 kg/m. Performance status (ECOG): 0 - Asymptomatic Physical Exam Constitutional:      Appearance: Normal appearance. She is not ill-appearing.     Comments: Mildly tremulous, nervous  HENT:     Mouth/Throat:     Mouth: Mucous membranes are moist.     Pharynx: Oropharynx is clear. No oropharyngeal exudate or posterior oropharyngeal erythema.  Cardiovascular:     Rate and Rhythm: Normal rate and regular rhythm.     Heart sounds: No murmur heard.   No friction rub. No gallop.  Pulmonary:     Effort: Pulmonary effort is normal. No respiratory distress.     Breath sounds: Normal breath sounds. No wheezing, rhonchi or rales.  Abdominal:     General: Bowel sounds are normal. There is no distension.     Palpations: Abdomen is soft. There is no mass.     Tenderness: There is no abdominal tenderness.  Musculoskeletal:        General: No  swelling.     Right lower leg: No edema.     Left lower leg: No edema.  Lymphadenopathy:     Cervical: No cervical adenopathy.     Upper Body:     Right upper body: No supraclavicular or axillary adenopathy.     Left upper body: No supraclavicular or axillary adenopathy.     Lower Body: No right inguinal adenopathy. No left inguinal adenopathy.  Skin:    General: Skin is warm.     Coloration: Skin is not jaundiced.     Findings: Lesion and rash present.  Neurological:     General: No focal deficit present.     Mental Status: She is alert and oriented to person, place, and time. Mental status is at baseline.  Psychiatric:        Mood and Affect: Mood normal.        Behavior: Behavior normal.        Thought Content: Thought content normal.   LABS:    SCANS:  Her CT scans done last week revealed the following:  FINDINGS:  CT CHEST FINDINGS  Cardiovascular: Normal caliber of the thoracic aorta. Normal heart  size. No pericardial effusion.  Mediastinum/Nodes: Small amount of triangular, low-density soft  tissue in the anterior mediastinum, likely residual thymus. No  enlarged axillary, mediastinal, or hilar lymph nodes. Unremarkable  included thyroid and esophagus.  Lungs/Pleura: No pleural effusion or pneumothorax. Minimal dependent  atelectasis in the lower lobes. No suspicious nodule or mass. Patent  central airways.  Musculoskeletal: 2 cm focus of faint ground-glass density in the  lateral body of the left scapula corresponding to the region of  uptake on the prior bone scan. No associated cortical disruption or  periosteal reaction. 9 mm densely sclerotic focus in the right  scapula just inferior to the glenoid as well as a 9 mm sclerotic  focus in the T10 vertebral body, neither of which has associated  abnormal uptake on bone scan and with the T10 lesion being unchanged  from the prior abdominal CT, favoring a benign process such as bone  islands.   CT ABDOMEN PELVIS  FINDINGS  Hepatobiliary: No focal liver abnormality is seen. Status post  cholecystectomy. No biliary dilatation.  Pancreas: Unremarkable.  Spleen: Unremarkable.  Adrenals/Urinary Tract: Unremarkable adrenal glands. No evidence of  a renal mass, calculi, or hydronephrosis. Unremarkable bladder.  Stomach/Bowel: The stomach is unremarkable. There is no evidence of  bowel obstruction or inflammation. The appendix is unremarkable.  Vascular/Lymphatic: Normal caliber of the abdominal aorta. No  enlarged lymph nodes.  Reproductive: Uterus and bilateral adnexa are unremarkable.  Other: No ascites or pneumoperitoneum.  Musculoskeletal: No suspicious osseous lesion.   IMPRESSION:  1. 2 cm focus of faint ground-glass density in the left scapula  corresponding to the uptake on bone scan. 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 No  aggressive findings.  2. No other suspicious bone lesions or acute abnormality identified  in the chest, abdomen, or pelvis.   ASSESSMENT & PLAN:  A 36 y.o. female with leukocytosis.  She also has mild thrombocythemia.   When evaluating her labs today, her CBC shows these persistent findings.  However, with her acute drug use, this can be causing a reactive bodily response leading to her white cells and platelets being elevated.  I strongly recommended and encouraged her to seek help for her drug use as this will likely lead to both an increased morbidity and mortality over this calendar year if her lifestyle does not change.  I offered her the chance for our office to refer to a drug rehab facility, for which she declined.  None of her peripheral counts is dangerously elevated.  They will continue to be followed conservatively.  I will see her back in 6 months for repeat clinical assessment.  The patient understands all the plans discussed today and is in agreement with them.  Aimee Timmons Kirby Funk, MD

## 2021-11-21 NOTE — Telephone Encounter (Signed)
Per 1/23 los next appt scheduled and confirmed with patient °

## 2021-11-28 ENCOUNTER — Other Ambulatory Visit: Payer: Self-pay

## 2021-11-28 ENCOUNTER — Encounter: Payer: Self-pay | Admitting: Pulmonary Disease

## 2021-11-28 ENCOUNTER — Ambulatory Visit (INDEPENDENT_AMBULATORY_CARE_PROVIDER_SITE_OTHER): Payer: Medicaid Other | Admitting: Pulmonary Disease

## 2021-11-28 VITALS — BP 110/62 | HR 88 | Temp 98.3°F | Ht 68.0 in | Wt 188.0 lb

## 2021-11-28 DIAGNOSIS — R0683 Snoring: Secondary | ICD-10-CM | POA: Diagnosis not present

## 2021-11-28 NOTE — Progress Notes (Signed)
Kathryn Holland    [id number]    Jun 28, 1986  Primary Care Physician:Yates, Iran Ouch, PA  Referring Physician: Marlyn Corporal, PA [address] [facility] [place],  [place] [address]  Chief complaint:   Patient with excessive daytime fatigue Chronic symptom that has gotten worse recently  HPI:  Has always dealt with fatigue History of depression with psychotic symptoms sometimes  Very vivid dreams and debilitating fatigue  Snoring, occasional wakes up gasping Sleep talking  Usually able to fall asleep easily, falls asleep in a few minutes, 3-4 awakenings Wake up time is variable  Weight is up about 30 pounds  Admits to dryness of her mouth in the mornings, no morning headaches She does have night sweats  Has a lot of nerve pain -On Neurontin 300 3 times daily, hydroxyzine 3 times daily, Cymbalta 60 -Medications that may cause fatigue and sleepiness  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   DULoxetine (CYMBALTA) 60 MG capsule Take 60 mg by mouth daily.   etonogestrel (NEXPLANON) 68 MG IMPL implant 68 mg by Subdermal route once.    meloxicam (MOBIC) 15 MG tablet meloxicam 15 mg tablet  Take 1 tablet every day by oral route.   ondansetron (ZOFRAN-ODT) 4 MG disintegrating tablet Take by mouth. (Patient not taking: Reported on 10/14/2021)   potassium chloride SA (KLOR-CON M) 20 MEQ tablet Take by mouth.   [DISCONTINUED] doxepin (SINEQUAN) 25 MG capsule Take 1 capsule (25 mg total) by mouth at bedtime and may repeat dose one time if needed. For depression (Patient not taking: Reported on 03/10/2019)   [DISCONTINUED] famotidine (PEPCID) 20 MG tablet Take 1 tablet (20 mg total) by mouth 2 (two) times daily for 7 days.    [DISCONTINUED] loratadine (CLARITIN) 10 MG tablet Take 1 tablet (10 mg total) by mouth daily. For allergies (Patient not taking: Reported on 03/10/2019)   [DISCONTINUED] omeprazole (PRILOSEC) 40 MG capsule Take 1 capsule (40 mg total) by mouth daily. (Patient not taking: Reported on 05/20/2020)   No facility-administered encounter medications on file as of 11/28/2021.    Allergies as of 11/28/2021 - Review Complete 11/28/2021  Allergen Reaction Noted   Butorphanol Anaphylaxis 02/01/2021   Stadol [butorphanol tartrate] Other (See Comments) 09/28/2011    Past Medical History:  Diagnosis Date   Gallstones    Kidney stone    Ovarian cyst    PUD (peptic ulcer disease)     Past Surgical History:  Procedure Laterality Date   LITHOTRIPSY      Family History  Problem Relation Age of Onset   Endometrial cancer Mother    Bipolar disorder Sister    Hypertension Father    Heart attack Father     Social History   Socioeconomic History   Marital status: Single    Spouse name: Not on file   Number of children: 3   Years of education: 51 +  SOME COLLEGE   Highest education level: Not on file  Occupational History   Occupation: UNEMPLOYED  Tobacco Use   Smoking status: Former    Packs/day: 1.00    Years: 0.50    Pack years: 0.50    Types: Cigarettes   Smokeless tobacco: Never  Tobacco comments:    QUIT THREE WEEKS AGO  Substance and Sexual Activity   Alcohol use: Not Currently    Comment: socially   Drug use: Yes    Types: Amphetamines   Sexual activity: Yes    Birth control/protection: None  Other Topics Concern   Not on file  Social History Narrative   Not on file   Social Determinants of Health   Financial Resource Strain: Not on file  Food Insecurity: Not on file  Transportation Needs: Not on file  Physical Activity: Not on file  Stress: Not on file  Social Connections: Not on file  Intimate Partner Violence: Not on file    Review of Systems   Constitutional:  Positive for fatigue.  Psychiatric/Behavioral:  Positive for sleep disturbance.    There were no vitals filed for this visit.   Physical Exam Constitutional:      Appearance: She is obese.  HENT:     Head: Normocephalic.     Mouth/Throat:     Mouth: Mucous membranes are moist.     Comments: Crowded oropharynx with Mallampati 4 Eyes:     Pupils: Pupils are equal, round, and reactive to light.  Cardiovascular:     Rate and Rhythm: Normal rate and regular rhythm.     Heart sounds: No murmur heard.   No friction rub.  Musculoskeletal:     Cervical back: No rigidity or tenderness.  Skin:    Findings: No lesion or rash.  Neurological:     Mental Status: She is alert.  Psychiatric:        Mood and Affect: Mood normal.        Behavior: Behavior normal.    Results of the Epworth flowsheet 11/28/2021  Sitting and reading 2  Watching TV 2  Sitting, inactive in a public place (e.g. a theatre or a meeting) 0  As a passenger in a car for an hour without a break 3  Lying down to rest in the afternoon when circumstances permit 2  Sitting and talking to someone 1  Sitting quietly after a lunch without alcohol 1  In a car, while stopped for a few minutes in traffic 1  Total score 12    Data Reviewed: No previous study on record  Assessment:   Excessive daytime sleepiness As stated above the patient reports a history excessive daytime sleepiness/fatigue despite adequate sleep duration and description of subjectively sound sleep.  The pathophysiology, diagnosis, and treatment of these disorders -May have underlying sleep apnea. In addition, the patient was cautioned about potential impairment driving and performing other complex tasks until further treatment is initiated.  Moderate probability for significant obstructive sleep apnea -Weight gain -Smoker -Gasping respirations at night, dry mouth in the mornings, night sweats -Oral exam showing crowding  History  of depression -May be contributing to poor sleep quality and daytime fatigue  Daytime fatigue -Multiple medications may be contributing to symptoms  Deconditioning -May be contributing to her fatigue as well  Plan/Recommendations: Pathophysiology of sleep disordered breathing discussed with the patient  Treatment options for sleep disordered breathing discussed with the patient  We will schedule patient for an in lab polysomnogram to optimally assess for sleep disordered breathing  Graded exercises recommended  May need medication adjustments-to try and maintain optimal treatment of pain and discomfort, depression and also avoid side effects  Tentative follow-up in 3 to 4 months   Virl Diamond MD [facility] 11/28/2021, 10:22 AM  CC:  Marlyn CorporalYates, Kate H, PA

## 2021-11-28 NOTE — Patient Instructions (Signed)
Scheduled for in lab polysomnogram-split-night study  Graded exercises as tolerated  Follow-up in 3 to 4 months  Call with significant concerns

## 2021-12-26 ENCOUNTER — Other Ambulatory Visit: Payer: Self-pay

## 2021-12-26 ENCOUNTER — Ambulatory Visit (HOSPITAL_BASED_OUTPATIENT_CLINIC_OR_DEPARTMENT_OTHER): Payer: Medicaid Other | Attending: Pulmonary Disease | Admitting: Pulmonary Disease

## 2021-12-26 DIAGNOSIS — R0683 Snoring: Secondary | ICD-10-CM | POA: Insufficient documentation

## 2021-12-26 DIAGNOSIS — G4731 Primary central sleep apnea: Secondary | ICD-10-CM | POA: Diagnosis not present

## 2021-12-26 DIAGNOSIS — G4733 Obstructive sleep apnea (adult) (pediatric): Secondary | ICD-10-CM

## 2022-01-05 ENCOUNTER — Telehealth: Payer: Self-pay | Admitting: Pulmonary Disease

## 2022-01-05 DIAGNOSIS — R0683 Snoring: Secondary | ICD-10-CM

## 2022-01-05 DIAGNOSIS — G4733 Obstructive sleep apnea (adult) (pediatric): Secondary | ICD-10-CM

## 2022-01-05 NOTE — Telephone Encounter (Signed)
Call patient ? ?Sleep study result ? ?Date of study: ?12/26/2021 ? ?Impression: ?Moderate obstructive sleep apnea ?Tolerated BiPAP well ? ?Recommendation: ? ?DME referral ? ?Auto BiPAP ?BiPAP settings of maximum IPAP of 25, minimum EPAP of 6, pressure support of 4 with heated humidification ?-Medium size ResMed fullface AirFit F30 mask ? ?Encourage weight loss measures ? ?Follow-up in the office 4 to 6 weeks following initiation of treatment ? ?

## 2022-01-05 NOTE — Telephone Encounter (Signed)
I called the patient and left a message for the patient to call for her back for her results.  ?

## 2022-01-05 NOTE — Procedures (Signed)
POLYSOMNOGRAPHY  Last, First: Kathryn Holland, Kathryn Holland MRN: [id number] Gender: Female Age (years): 36 Weight (lbs): 180 DOB: 04-27-1986 BMI: 27 Primary Care: No PCP Epworth Score: 17 Referring: Tomma Lightning MD Technician: Ulyess Mort Interpreting: Tomma Lightning MD Study Type: Split Night BiPAP Ordered Study Type: Split Night CPAP Study date: 12/26/2021 Location: [place] CLINICAL INFORMATION Kathryn Holland is a 36 year old Female and was referred to the sleep center for evaluation of G47.33 OSA: Adult and Pediatric (327.23). Indications include Excessive Daytime Sleepiness, Fatigue, Hypertension, Snoring.  MEDICATIONS Patient self administered medications include: N/A. Medications administered during study include No sleep medicine administered.  SLEEP STUDY TECHNIQUE The patient underwent an attended overnight level one polysomnography titration to assess the effects of BIPAP therapy. The following variables were monitored: EEG (C4-A1, C3-A2, O1-A2, O2-A1), EOG, submental and leg EMG, ECG, oxyhemoglobin saturation by pulse oximetry, thoracic and abdominal respiratory effort belts, nasal/oral airflow by pressure sensor, body position sensor and snoring sensor. BIPAP pressure was titrated to eliminate apneas, hypopneas and oxygen desaturation.Hypopneas were scored per AASM definition IB (4% desaturation)  The NPSG portion of the study ended at 2:00:18 AM. The BiPAP titration was initiated at 2:05:00 AM AM with the BIPAP portion of the study ending at 4:36:09 AM.  TECHNICIAN COMMENTS Comments added by Technician: Patient was ordered as a split night study. Comments added by Scorer: N/A SLEEP ARCHITECTURE The recording time for the entire night was 387 minutes.  The diagnostic portion was initiated at 10:09:07 PM and terminated at 2:00:18 AM. The time in bed was 231.2 minutes. EEG confirmed total sleep time was 175.1 minutes yielding a sleep efficiency of 75.7%%. Sleep onset  after lights out was 15.6 minutes with a REM latency of 52.5 minutes. The patient spent 26.0%% of the night in stage N1 sleep, 44.3% % in stage N2 sleep, 0.0%% in stage N3 and 29.7% in REM. The Arousal Index was 82.2/hour.  The titration portion was initiated at 2:05:00 AM and terminated at 4:36:09 AM. The time in bed was 151.1 minutes. EEG confirmed total sleep time was 124.5 minutes yielding a sleep efficiency of 82.4%%. Sleep onset after BiPAP initiation was 11.2 minutes with a REM latency of 57.5 minutes. The patient spent 6.0%% of the night in stage N1 sleep, 70.7%% in stage N2 sleep, 0.0%% in stage N3 and 23.3% in REM. The Arousal Index was 40.5/hour. RESPIRATORY PARAMETERS During the diagnostic portion, there were a total of 232 respiratory disturbances recorded; 2 apneas ( 0 obstructive, 0 mixed, 2 central), 77 hypopneas and 153 RERAs. The apnea/hypopnea index 27.1 was events/hour and the RDI was 79.5 events/hour. The central sleep apnea index was 0.7 events/hour. The REM AHI was 16.2/h and NREM AHI was 32.2/h. The REM RDI was 39.2/h and NREM RDI was 97.0/h. The supine AHI was 65.0/h, and the non supine AHI was 10.8/h; supine during 30.1%% of sleep. The supine RDI was 117.4/h, and the non supine RDI was 63.20/h. Respiratory disturbances were associated with oxygen desaturation down to a nadir of 87.0 % during sleep. The mean oxygen saturation during the study was 94.2%. The cumulative time under 88% oxygen saturation was 0.3 minutes.  During the titration portion, the apnea/hypopnea index (AHI) was 22.7 events/hour and the RDI was 42.4 events/hour. The central sleep apnea index was events/hour. The most appropriate setting of BiPAP was IPAP/EPAP 23/19 cm H2O. At this setting, the sleep efficiency was 24% and the patient was supine for 100%. The AHI was 58.1 events per hour(with 0  central events). Oxygen nadir was 94.0 . LEG MOVEMENT DATA The periodic limb movement index was 0.0/hour with an  associated arousal index of /hour. CARDIAC DATA The underlying cardiac rhythm was most consistent with sinus rhythm. Mean heart rate was 78.2 during diagnostic portion and 80.7 during titration portion of study. Additional rhythm abnormalities include None.  IMPRESSIONS - EKG showed no cardiac abnormalities. - The patient snored with moderate snoring volume. - Moderate Obstructive Sleep apnea(OSA) Optimal pressure attained. - Mild Central Sleep Apnea (CSA) - Mild Oxygen Desaturation - No significant periodic leg movements(PLMs) during sleep. However, no significant associated arousals. - Reduced sleep efficiency, normal primary sleep latency, short REM sleep latency and no slow wave latency.  DIAGNOSIS - Obstructive Sleep Apnea (G47.33)  RECOMMENDATIONS - Trial of BiPAP therapy on 23/19 cm H2O with a Medium size Resmed Full Face AirFit F30 mask and heated humidification. - Auto bilevel device may be most appropriate with significant variability with sleep positions, Auto Bipap with settings of Max IPAP of 25, Min EPAP of 6, PS of 4 with heated humidification. - Avoid alcohol, sedatives and other CNS depressants that may worsen sleep apnea and disrupt normal sleep architecture. - Sleep hygiene should be reviewed to assess factors that may improve sleep quality. - Weight management and regular exercise should be initiated or continued. - Return to Sleep Center for re-evaluation.  [Electronically signed] 01/05/2022 06:41 AM  Virl Diamond MD NPI: [id number]

## 2022-01-09 NOTE — Telephone Encounter (Signed)
Called pt, fiance answered and left msg with him to have her call us back.  ?

## 2022-01-09 NOTE — Telephone Encounter (Signed)
Patient is returning phone call. Patient phone number is [phone]. ?

## 2022-01-10 NOTE — Telephone Encounter (Signed)
Patient is aware of results and voiced her understanding.  ?Order placed for bipap.  ?OV scheduled 02/28/2022. ?Nothing further needed.  ? ?

## 2022-02-28 ENCOUNTER — Encounter: Payer: Self-pay | Admitting: Pulmonary Disease

## 2022-02-28 ENCOUNTER — Ambulatory Visit (INDEPENDENT_AMBULATORY_CARE_PROVIDER_SITE_OTHER): Payer: Medicaid Other | Admitting: Pulmonary Disease

## 2022-02-28 VITALS — BP 116/72 | HR 83 | Ht 68.0 in | Wt 200.0 lb

## 2022-02-28 DIAGNOSIS — G4733 Obstructive sleep apnea (adult) (pediatric): Secondary | ICD-10-CM

## 2022-02-28 MED ORDER — AZELASTINE HCL 0.1 % NA SOLN: 1.0000 | Freq: Two times a day (BID) | NASAL | 12 refills | Status: AC

## 2022-02-28 NOTE — Patient Instructions (Signed)
adjust auto BiPAP settings ? ?Minimum EPAP of 6, maximum IPAP of 20 with a pressure support of 4 ? ?Prescription for azelastine sent to pharmacy ?-If not covered, Astepro can be obtained over-the-counter ?-2 sprays each nostril daily or 1 spray twice a day ? ?I will see you back in about 6 weeks ? ?Try and continue using your BiPAP on a nightly basis ? ?Call with significant concerns ? ? ?

## 2022-02-28 NOTE — Progress Notes (Signed)
? ?      ?Kathryn PlaneSarah Holland    [id number]    1985-11-30 ? ?Primary Care Physician:Yates, Iran OuchKate H, PA ? ?Referring Physician: Marlyn CorporalYates, Kate H, PA ?[address] [facility]  [place] [id number] ? ?Chief complaint:   ?Patient with excessive daytime fatigue ?Chronic symptom that has gotten worse recently ? ?HPI: ? ?Has always dealt with fatigue ?History of depression with psychotic symptoms sometimes ? ?Since her last visit, off Cymbalta, just on Wellbutrin and naltrexone ? ?Continues to struggle with debilitating fatigue ? ?Recent sleep study did show obstructive sleep apnea for which she was started on BiPAP ? ?She claims good compliance with BiPAP although sometimes feels the pressure may be too high, she does have some belching in the morning, dryness of her mouth in the morning as well ? ?Usually able to fall asleep easily, falls asleep in a few minutes, 3-4 awakenings ?Wake up time is variable ? ?Weight has been stable since last visit, although did gain about 30 pounds recently ? ?Admits to dryness of her mouth in the mornings, no morning headaches ?She does have night sweats ? ?Has a lot of nerve pain ?-On Neurontin 300 3 times daily-taking it less often, hydroxyzine 3 times daily, -Medications that may cause fatigue and sleepiness ? 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 ? ?Outpatient Encounter Medications as of 02/28/2022  ?Medication Sig  ? azelastine (ASTELIN) 0.1 % nasal spray Place 1 spray into both nostrils 2 (two) times daily. Use in each nostril as directed  ? B Complex-C (B-COMPLEX WITH VITAMIN C) tablet Take 1 tablet by mouth daily.  ? buPROPion (WELLBUTRIN XL) 150 MG 24 hr tablet Take 150 mg by mouth daily.  ? Cholecalciferol 25 MCG (1000 UT) tablet Take by mouth.  ? etonogestrel (NEXPLANON) 68 MG IMPL implant 68 mg by Subdermal route once.   ? gabapentin (NEURONTIN) 600 MG  tablet Take 600 mg by mouth 3 (three) times daily.  ? hydrOXYzine (ATARAX) 10 MG tablet Take 10 mg by mouth 3 (three) times daily as needed.  ? meloxicam (MOBIC) 15 MG tablet meloxicam 15 mg tablet ? Take 1 tablet every day by oral route.  ? naltrexone (DEPADE) 50 MG tablet Take 50 mg by mouth daily.  ? potassium chloride SA (KLOR-CON M) 20 MEQ tablet Take by mouth.  ? [DISCONTINUED] doxepin (SINEQUAN) 25 MG capsule Take 1 capsule (25 mg total) by mouth at bedtime and may repeat dose one time if needed. For depression (Patient not taking: Reported on 03/10/2019)  ? [DISCONTINUED] DULoxetine (CYMBALTA) 60 MG capsule Take 60 mg by mouth daily. (Patient not taking: Reported on 02/28/2022)  ? [DISCONTINUED] famotidine (PEPCID) 20 MG tablet Take 1 tablet (20 mg total) by mouth 2 (two) times daily for 7 days.  ? [DISCONTINUED] loratadine (CLARITIN) 10 MG tablet Take 1 tablet (10 mg total) by mouth daily. For allergies (Patient not taking: Reported on 03/10/2019)  ? [DISCONTINUED] omeprazole (PRILOSEC) 40 MG capsule Take 1 capsule (40 mg total) by mouth daily. (Patient not taking: Reported on 05/20/2020)  ? ?No facility-administered encounter medications on file as of 02/28/2022.  ? ? ?Allergies as of 02/28/2022 - Review Complete 02/28/2022  ?Allergen Reaction Noted  ? Butorphanol Anaphylaxis 02/01/2021  ? Stadol [butorphanol tartrate] Other (See Comments) 09/28/2011  ? ? ?Past Medical History:  ?Diagnosis Date  ? Gallstones   ?  Kidney stone   ? Ovarian cyst   ? PUD (peptic ulcer disease)   ? ? ?Past Surgical History:  ?Procedure Laterality Date  ? LITHOTRIPSY    ? ? ?Family History  ?Problem Relation Age of Onset  ? Endometrial cancer Mother   ? Bipolar disorder Sister   ? Hypertension Father   ? Heart attack Father   ? ? ?Social History  ? ?Socioeconomic History  ? Marital status: Single  ?  Spouse name: Not on file  ? Number of children: 3  ? Years of education: 41 +  SOME COLLEGE  ? Highest education level: Not on file   ?Occupational History  ? Occupation: UNEMPLOYED  ?Tobacco Use  ? Smoking status: Former  ?  Packs/day: 1.00  ?  Years: 0.50  ?  Pack years: 0.50  ?  Types: Cigarettes  ? Smokeless tobacco: Never  ? Tobacco comments:  ?  QUIT THREE WEEKS AGO, She reports sometimes vaping.   ?Substance and Sexual Activity  ? Alcohol use: Not Currently  ?  Comment: socially  ? Drug use: Yes  ?  Types: Amphetamines  ? Sexual activity: Yes  ?  Birth control/protection: None  ?Other Topics Concern  ? Not on file  ?Social History Narrative  ? Not on file  ? ?Social Determinants of Health  ? ?Financial Resource Strain: Not on file  ?Food Insecurity: Not on file  ?Transportation Needs: Not on file  ?Physical Activity: Not on file  ?Stress: Not on file  ?Social Connections: Not on file  ?Intimate Partner Violence: Not on file  ? ? ?Review of Systems  ?Constitutional:  Positive for fatigue.  ?Respiratory:  Positive for apnea and shortness of breath.   ?Psychiatric/Behavioral:  Positive for sleep disturbance.   ? ?Vitals:  ? 02/28/22 0958  ?BP: 116/72  ?Pulse: 83  ?SpO2: 99%  ? ? ? ?Physical Exam ?Constitutional:   ?   Appearance: She is obese.  ?HENT:  ?   Head: Normocephalic.  ?   Mouth/Throat:  ?   Mouth: Mucous membranes are moist.  ?   Comments: Crowded oropharynx with Mallampati 4 ?Eyes:  ?   Pupils: Pupils are equal, round, and reactive to light.  ?Cardiovascular:  ?   Rate and Rhythm: Normal rate and regular rhythm.  ?   Heart sounds: No murmur heard. ?  No friction rub.  ?Pulmonary:  ?   Effort: No respiratory distress.  ?   Breath sounds: No stridor. No wheezing or rhonchi.  ?Musculoskeletal:  ?   Cervical back: No rigidity or tenderness.  ?Skin: ?   Findings: No lesion or rash.  ?Neurological:  ?   Mental Status: She is alert.  ?Psychiatric:     ?   Mood and Affect: Mood normal.     ?   Behavior: Behavior normal.  ? ? ? ?  11/28/2021  ? 10:00 AM  ?Results of the Epworth flowsheet  ?Sitting and reading 2  ?Watching TV 2  ?Sitting,  inactive in a public place (e.g. a theatre or a meeting) 0  ?As a passenger in a car for an hour without a break 3  ?Lying down to rest in the afternoon when circumstances permit 2  ?Sitting and talking to someone 1  ?Sitting quietly after a lunch without alcohol 1  ?In a car, while stopped for a few minutes in traffic 1  ?Total score 12  ? ? ?Data Reviewed: ?Sleep study reviewed showing moderate obstructive  sleep apnea ?Optimal pressure found to be 23/19 ? ?Compliance data shows suboptimal compliance although patient states she has been using the machine on a regular basis ?On auto BiPAP maximum IPAP of 25, minimum EPAP of 6 with a pressure support of 4 ? ?Assessment:  ? ?Moderate obstructive sleep apnea ?-On BiPAP therapy ? ?Excessive daytime sleepiness ?-This appears to be multifactorial ?-Suboptimally treated sleep disordered breathing ?-Multiple medications that may be contributing to daytime sleepiness and fatigue ? ?Significant history of depression ?-Feels depression is better controlled at present ? ?Daytime fatigue ?-Multifactorial ? ?Deconditioning ? ?BiPAP compliance appears suboptimal although patient claims to be using regularly ?-Has issues with dryness of her mouth in the morning ?-Issues with belching ?-We did discuss adjusting pressures ?-We will adjust pressures from maximum IPAP of 25 to maximum IPAP of 20 ? ?Active smoker ?-Smoking cessation counseling ? ?Plan/Recommendations: ?Continue auto BiPAP ? ?Continue other medications ? ?Regular activity as tolerated ? ?She does have some nasal stuffiness and congestion ?-Astelin recommended ?-Prescription sent in, ?-May need to be procured over-the-counter ? ?I spent 30 minutes dedicated to the care of this patient on the date of this encounter to include previsit review of records, face-to-face time with the patient discussing conditions above, post visit ordering of testing, clinical documentation with electronic health record, making appropriate  referrals as documented, and communicated necessary findings to members of the patient's care team ? ?Virl Diamond MD ?[facility] ?02/28/2022, 10:27 AM ? ?CC: Marlyn Corporal, PA ? ? ?

## 2022-04-11 ENCOUNTER — Ambulatory Visit (INDEPENDENT_AMBULATORY_CARE_PROVIDER_SITE_OTHER): Payer: Medicaid Other | Admitting: Pulmonary Disease

## 2022-04-11 ENCOUNTER — Encounter: Payer: Self-pay | Admitting: Pulmonary Disease

## 2022-04-11 VITALS — BP 126/80 | HR 89 | Temp 98.1°F | Ht 68.0 in | Wt 199.8 lb

## 2022-04-11 DIAGNOSIS — G4731 Primary central sleep apnea: Secondary | ICD-10-CM

## 2022-04-11 MED ORDER — GABAPENTIN 600 MG PO TABS: 600.0000 mg | ORAL_TABLET | Freq: Three times a day (TID) | ORAL | 3 refills | Status: DC

## 2022-04-11 NOTE — Patient Instructions (Signed)
Continue using BiPAP at current settings  Prescription for Neurontin sent to pharmacy for you  I will see you in about 4 weeks We will get another download from the machine  I think one of your medications may be causing you to have some central sleep apnea that is why we are still seeing a lot of events on your download  We will check up on this in a few weeks and if still persistent We will need to get you back in the sleep lab to titrate you to a different machine

## 2022-04-11 NOTE — Progress Notes (Signed)
Kathryn Holland    [id number]    1986-08-24  Primary Care Physician:Yates, Iran Ouch, PA  Referring Physician: Marlyn Corporal, PA [address] [facility] [place],  [place] [address]  Chief complaint:   Patient with excessive daytime fatigue Chronic symptom that has gotten worse recently  HPI:  Continues to have significant fatigue History of depression with psychotic symptoms She does have an appointment to see a psychiatrist in the next couple of weeks  We had decreased BiPAP pressures at last visit from a max BiPAP pressure of 25 down to 20  Download from the machine is still showing significant central sleep apnea events  Patient tries to use BiPAP every night  She feels a little bit better with the pressure change  Recently ran out of Neurontin -Prescription will be sent to pharmacy   Has always dealt with fatigue History of depression with psychotic symptoms sometimes  Since her last visit, off Cymbalta, just on Wellbutrin and naltrexone  Off Cymbalta  Recent sleep study did show obstructive sleep apnea for which she was started on BiPAP  Pressure change was initiated during the last visit as patient was complaining of pressure being too high with associated dryness  Usually able to fall asleep easily, falls asleep in a few minutes, 3-4 awakenings Wake up time is variable  Weight has been stable since last visit, although did gain about 30 pounds recently  Admits to dryness of her mouth in the mornings, no morning headaches She does have night sweats  Has a lot of nerve pain -On Neurontin 300 3 times daily-taking it less often, hydroxyzine 3 times daily, -Medications that may cause fatigue and sleepiness  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 Use in each nostril as directed   B Complex-C (B-COMPLEX WITH VITAMIN C) tablet Take 1 tablet by mouth daily.   buPROPion (WELLBUTRIN XL) 150 MG 24 hr tablet Take 150 mg by mouth daily.   Cholecalciferol 25 MCG (1000 UT) tablet Take by mouth.   etonogestrel (NEXPLANON) 68 MG IMPL implant 68 mg by Subdermal route once.    gabapentin (NEURONTIN) 600 MG tablet Take 600 mg by mouth 3 (three) times daily.   hydrOXYzine (ATARAX) 10 MG tablet Take 10 mg by mouth 3 (three) times daily as needed.   meloxicam (MOBIC) 15 MG tablet meloxicam 15 mg tablet  Take 1 tablet every day by oral route.   naltrexone (DEPADE) 50 MG tablet Take 50 mg by mouth daily.   potassium chloride SA (KLOR-CON M) 20 MEQ tablet Take by mouth.   [DISCONTINUED] doxepin (SINEQUAN) 25 MG capsule Take 1 capsule (25 mg total) by mouth at bedtime and may repeat dose one time if needed. For depression (Patient not taking: Reported on 03/10/2019)   [DISCONTINUED] famotidine (PEPCID) 20 MG tablet Take 1 tablet (20 mg total) by mouth 2 (two) times daily for 7 days.   [DISCONTINUED] loratadine (CLARITIN) 10 MG tablet Take 1 tablet (10 mg total) by mouth daily. For allergies (Patient not taking: Reported on 03/10/2019)   [DISCONTINUED] omeprazole (PRILOSEC) 40 MG capsule Take 1 capsule (40 mg total) by mouth daily. (Patient not taking:  Reported on 05/20/2020)   No facility-administered encounter medications on file as of 04/11/2022.    Allergies as of 04/11/2022 - Review Complete 04/11/2022  Allergen Reaction Noted   Butorphanol Anaphylaxis 02/01/2021   Stadol [butorphanol tartrate] Other (See Comments) 09/28/2011    Past Medical History:  Diagnosis Date   Gallstones    Kidney stone    Ovarian cyst    PUD (peptic ulcer disease)     Past Surgical History:  Procedure Laterality Date   LITHOTRIPSY      Family History  Problem Relation Age of Onset    Endometrial cancer Mother    Bipolar disorder Sister    Hypertension Father    Heart attack Father     Social History   Socioeconomic History   Marital status: Single    Spouse name: Not on file   Number of children: 3   Years of education: 39 +  [employer] COL[employer]   Highest education level: Not on file  Occupational History   Occupation: UNEMPLOYED  Tobacco Use   Smoking status: Former    Packs/day: 1.00    Years: 0.50    Total pack years: 0.50    Types: Cigarettes   Smokeless tobacco: Never   Tobacco comments:    QUIT THREE WEEKS AGO, She reports sometimes vaping.   Substance and Sexual Activity   Alcohol use: Not Currently    Comment: socially   Drug use: Yes    Types: Amphetamines   Sexual activity: Yes    Birth control/protection: None  Other Topics Concern   Not on file  Social History Narrative   Not on file   Social Determinants of Health   Financial Resource Strain: Not on file  Food Insecurity: Not on file  Transportation Needs: Not on file  Physical Activity: Not on file  Stress: Not on file  Social Connections: Not on file  Intimate Partner Violence: Not on file    Review of Systems  Constitutional:  Positive for fatigue.  Respiratory:  Positive for apnea and shortness of breath.   Psychiatric/Behavioral:  Positive for sleep disturbance.     Vitals:   04/11/22 1017  BP: 126/80  Pulse: 89  Temp: 98.1 F (36.7 C)  SpO2: 98%     Physical Exam Constitutional:      Appearance: She is obese.  HENT:     Head: Normocephalic.     Mouth/Throat:     Mouth: Mucous membranes are moist.     Comments: Crowded oropharynx with Mallampati 4 Eyes:     Pupils: Pupils are equal, round, and reactive to light.  Cardiovascular:     Rate and Rhythm: Normal rate and regular rhythm.     Heart sounds: No murmur heard.    No friction rub.  Pulmonary:     Effort: No respiratory distress.     Breath sounds: No stridor. No wheezing or rhonchi.  Musculoskeletal:      Cervical back: No rigidity or tenderness.  Skin:    Findings: No lesion or rash.  Neurological:     Mental Status: She is alert.  Psychiatric:        Mood and Affect: Mood normal.        Behavior: Behavior normal.        11/28/2021   10:00 AM  Results of the Epworth flowsheet  Sitting and reading 2  Watching TV 2  Sitting, inactive in a public place (e.g. a theatre or a meeting) 0  As  a passenger in a car for an hour without a break 3  Lying down to rest in the afternoon when circumstances permit 2  Sitting and talking to someone 1  Sitting quietly after a lunch without alcohol 1  In a car, while stopped for a few minutes in traffic 1  Total score 12    Data Reviewed: Sleep study reviewed showing moderate obstructive sleep apnea Optimal pressure found to be 23/19  Compliance data reviewed showing excellent compliance with BiPAP use Current settings of maximum pressure of 20, minimum pressure of 5 with a pressure support of 4 She still having significant central apnea events, minimal mask leaks  Assessment:   Central sleep apnea -On BiPAP therapy  She feels relatively better with recent pressure changes  She still has significant daytime fatigue Excessive daytime sleepiness -This is multifactorial -May be related to medications as well  Deconditioning  Daytime fatigue -Multifactorial  BiPAP compliance is significantly improved with pressure changes  Active smoker -Smoking cessation counseling  Plan/Recommendations: Continue auto BiPAP  I will see her back in about 4 weeks and get another download If still having significant central sleep apnea events  We will have to get her back to the sleep lab to be titrated to BiPAP ST  Prescription for Neurontin sent to pharmacy  I spent 30 minutes dedicated to the care of this patient on the date of this encounter to include previsit review of records, face-to-face time with the patient discussing conditions  above, post visit ordering of testing, clinical documentation with electronic health record, making appropriate referrals as documented, and communicated necessary findings to members of the patient's care team  Virl DiamondAdewale Sharlize Hoar MD [facility] 04/11/2022, 10:25 AM  CC: Kathryn CorporalYates, Kate H, PA

## 2022-05-17 ENCOUNTER — Encounter: Payer: Self-pay | Admitting: Pulmonary Disease

## 2022-05-17 ENCOUNTER — Ambulatory Visit (INDEPENDENT_AMBULATORY_CARE_PROVIDER_SITE_OTHER): Payer: Medicaid Other | Admitting: Pulmonary Disease

## 2022-05-17 VITALS — BP 110/70 | HR 65 | Temp 98.3°F | Ht 68.0 in | Wt 200.8 lb

## 2022-05-17 DIAGNOSIS — G4733 Obstructive sleep apnea (adult) (pediatric): Secondary | ICD-10-CM

## 2022-05-17 DIAGNOSIS — G4731 Primary central sleep apnea: Secondary | ICD-10-CM | POA: Diagnosis not present

## 2022-05-17 NOTE — Addendum Note (Signed)
Addended by: Arvilla Market on: 05/17/2022 11:06 AM   Modules accepted: Orders

## 2022-05-17 NOTE — Patient Instructions (Signed)
Schedule for titration study  On BiPAP currently still having significant central sleep apnea events  Needs titrated to BiPAP ST or adaptive servo for central sleep apnea  Follow-up in 3 months

## 2022-05-17 NOTE — Progress Notes (Signed)
Kathryn Holland    [id number]    [birth year]  Primary Care Physician:Yates, Iran Ouch, PA  Referring Physician: Marlyn Corporal, PA [address] [facility] [place],  [place] [address]  Chief complaint:   Patient with excessive daytime fatigue Chronic symptom that has gotten worse recently  HPI:  Still with significant fatigue No significant changes in her medications recently  Off naltrexone, does use THC  Continues to follow-up with a psychiatrist  Maximum IPAP settings of 20  Still with significant central sleep apnea events and fatigue during the day  Patient tries to use BiPAP every night  She feels a little bit better with the pressure change  Recently ran out of Neurontin -Prescription will be sent to pharmacy   Has always dealt with fatigue History of depression with psychotic symptoms sometimes  Recent sleep study did show obstructive sleep apnea for which she was started on BiPAP  Pressure change was initiated during the last visit as patient was complaining of pressure being too high with associated dryness-lower pressures are better for her  Usually able to fall asleep easily, falls asleep in a few minutes, 3-4 awakenings Wake up time is variable  Weight has been stable since last visit, although did gain about 30 pounds recently  Admits to dryness of her mouth in the mornings, no morning headaches She does have night sweats  Has a lot of nerve pain -On Neurontin 300 3 times daily-taking it less often, hydroxyzine 3 times daily, -Medications that may cause fatigue and sleepiness  She has used 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 Use in each nostril as directed   B Complex-C (B-COMPLEX WITH VITAMIN C) tablet Take 1 tablet by mouth daily.   buPROPion  (WELLBUTRIN XL) 150 MG 24 hr tablet Take 150 mg by mouth daily.   Cholecalciferol 25 MCG (1000 UT) tablet Take by mouth.   etonogestrel (NEXPLANON) 68 MG IMPL implant 68 mg by Subdermal route once.    gabapentin (NEURONTIN) 600 MG tablet Take 1 tablet (600 mg total) by mouth 3 (three) times daily.   hydrOXYzine (ATARAX) 10 MG tablet Take 10 mg by mouth 3 (three) times daily as needed.   meloxicam (MOBIC) 15 MG tablet meloxicam 15 mg tablet  Take 1 tablet every day by oral route.   naltrexone (DEPADE) 50 MG tablet Take 50 mg by mouth daily.   potassium chloride SA (KLOR-CON M) 20 MEQ tablet Take by mouth.   [DISCONTINUED] doxepin (SINEQUAN) 25 MG capsule Take 1 capsule (25 mg total) by mouth at bedtime and may repeat dose one time if needed. For depression (Patient not taking: Reported on 03/10/2019)   [DISCONTINUED] famotidine (PEPCID) 20 MG tablet Take 1 tablet (20 mg total) by mouth 2 (two) times daily for 7 days.   [DISCONTINUED] loratadine (CLARITIN) 10 MG tablet Take 1 tablet (10 mg total) by mouth daily. For allergies (Patient not taking: Reported on 03/10/2019)   [DISCONTINUED] omeprazole (PRILOSEC) 40 MG capsule Take 1 capsule (40 mg total) by mouth daily. (Patient not taking: Reported on 05/20/2020)   No facility-administered encounter medications on file as of 05/17/2022.    Allergies as of 05/17/2022 - Review Complete 04/11/2022  Allergen Reaction Noted   Butorphanol Anaphylaxis 02/01/2021   Stadol [butorphanol  tartrate] Other (See Comments) 09/28/2011    Past Medical History:  Diagnosis Date   Gallstones    Kidney stone    Ovarian cyst    PUD (peptic ulcer disease)     Past Surgical History:  Procedure Laterality Date   LITHOTRIPSY      Family History  Problem Relation Age of Onset   Endometrial cancer Mother    Bipolar disorder Sister    Hypertension Father    Heart attack Father     Social History   Socioeconomic History   Marital status: Single    Spouse  name: Not on file   Number of children: 3   Years of education: 89 +  [employer] COL[employer]   Highest education level: Not on file  Occupational History   Occupation: UNEMPLOYED  Tobacco Use   Smoking status: Former    Packs/day: 1.00    Years: 0.50    Total pack years: 0.50    Types: Cigarettes   Smokeless tobacco: Never   Tobacco comments:    QUIT THREE WEEKS AGO, She reports sometimes vaping.   Substance and Sexual Activity   Alcohol use: Not Currently    Comment: socially   Drug use: Yes    Types: Amphetamines   Sexual activity: Yes    Birth control/protection: None  Other Topics Concern   Not on file  Social History Narrative   Not on file   Social Determinants of Health   Financial Resource Strain: Not on file  Food Insecurity: Not on file  Transportation Needs: Not on file  Physical Activity: Not on file  Stress: Not on file  Social Connections: Not on file  Intimate Partner Violence: Not on file    Review of Systems  Constitutional:  Positive for fatigue.  Respiratory:  Positive for apnea and shortness of breath.   Psychiatric/Behavioral:  Positive for sleep disturbance.     There were no vitals filed for this visit.    Physical Exam Constitutional:      Appearance: She is obese.  HENT:     Head: Normocephalic.     Mouth/Throat:     Mouth: Mucous membranes are moist.     Comments: Crowded oropharynx with Mallampati 4 Eyes:     Pupils: Pupils are equal, round, and reactive to light.  Cardiovascular:     Rate and Rhythm: Normal rate and regular rhythm.     Heart sounds: No murmur heard.    No friction rub.  Pulmonary:     Effort: No respiratory distress.     Breath sounds: No stridor. No wheezing or rhonchi.  Musculoskeletal:     Cervical back: No rigidity or tenderness.  Skin:    Findings: No lesion or rash.  Neurological:     Mental Status: She is alert.  Psychiatric:        Mood and Affect: Mood normal.        Behavior: Behavior normal.        11/28/2021   10:00 AM  Results of the Epworth flowsheet  Sitting and reading 2  Watching TV 2  Sitting, inactive in a public place (e.g. a theatre or a meeting) 0  As a passenger in a car for an hour without a break 3  Lying down to rest in the afternoon when circumstances permit 2  Sitting and talking to someone 1  Sitting quietly after a lunch without alcohol 1  In a car, while stopped for a few minutes in traffic  1  Total score 12    Data Reviewed: Sleep study reviewed showing moderate obstructive sleep apnea Optimal pressure found to be 23/19  Compliance data reviewed showing excellent compliance with BiPAP Maximum pressure of 20, minimum pressure of 5 with pressure support of 4 Still having significant central apnea events  Assessment:   Central sleep apnea -On BiPAP therapy  Lower pressures have helped Still with significant fatigue Still with significant central apneic events while on BiPAP  Active smoker -Smoking cessation counseling  Plan/Recommendations: Continue BiPAP  We will set up in the sleep lab for titration to BiPAP ST adaptive servo for central sleep apnea   Encouraged to call with significant concerns  I will see her back in about 3 months  Virl Diamond MD  Pulmonary and Critical Care 05/17/2022, 10:23 AM  CC: Marlyn Corporal, PA

## 2022-05-21 NOTE — Progress Notes (Deleted)
[facility]  [address] [place],  [place]  [address] [phone]  Clinic Day:  03/11/2021  Referring physician: Marlyn Corporal, PA  HISTORY OF PRESENT ILLNESS:  The patient is a 36 y.o. female who I have been following for leukocytosis.   However, over these past weeks, she has complained of diffuse bodily/bone pain.  She has had both a bone scan and CT scans which showed no evidence of an occult malignancy or other diffuse osseous process.  The major issue that appears to impact this patient the most is her drug use.  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  She claims she can quit at any time, but still finds ways to relapse.     PHYSICAL EXAM:  There were no vitals taken for this visit. Wt Readings from Last 3 Encounters:  05/17/22 200 lb 12.8 oz (91.1 kg)  04/11/22 199 lb 12.8 oz (90.6 kg)  02/28/22 200 lb (90.7 kg)   There is no height or weight on file to calculate BMI. Performance status (ECOG): 0 - Asymptomatic Physical Exam Constitutional:      Appearance: Normal appearance. She is not ill-appearing.     Comments: Mildly tremulous, nervous  HENT:     Mouth/Throat:     Mouth: Mucous membranes are moist.     Pharynx: Oropharynx is clear. No oropharyngeal exudate or posterior oropharyngeal erythema.  Cardiovascular:     Rate and Rhythm: Normal rate and regular rhythm.     Heart sounds: No murmur heard.    No friction rub. No gallop.  Pulmonary:     Effort: Pulmonary effort is normal. No respiratory distress.     Breath sounds: Normal breath sounds. No wheezing, rhonchi or rales.  Abdominal:     General: Bowel sounds are normal. There is no distension.     Palpations: Abdomen is soft. There is no mass.     Tenderness: There is no abdominal tenderness.  Musculoskeletal:        General: No swelling.     Right lower leg: No edema.     Left lower leg: No edema.  Lymphadenopathy:      Cervical: No cervical adenopathy.     Upper Body:     Right upper body: No supraclavicular or axillary adenopathy.     Left upper body: No supraclavicular or axillary adenopathy.     Lower Body: No right inguinal adenopathy. No left inguinal adenopathy.  Skin:    General: Skin is warm.     Coloration: Skin is not jaundiced.     Findings: Lesion and rash present.  Neurological:     General: No focal deficit present.     Mental Status: She is alert and oriented to person, place, and time. Mental status is at baseline.  Psychiatric:        Mood and Affect: Mood normal.        Behavior: Behavior normal.        Thought Content: Thought content normal.    LABS:    SCANS:  Her CT scans done last week revealed the following:  FINDINGS:  CT CHEST FINDINGS  Cardiovascular: Normal caliber of the thoracic aorta. Normal heart  size. No pericardial effusion.  Mediastinum/Nodes: Small amount of triangular, low-density soft  tissue in the anterior mediastinum, likely residual thymus. No  enlarged axillary, mediastinal, or hilar lymph nodes. Unremarkable  included thyroid and esophagus.  Lungs/Pleura: No  pleural effusion or pneumothorax. Minimal dependent  atelectasis in the lower lobes. No suspicious nodule or mass. Patent  central airways.  Musculoskeletal: 2 cm focus of faint ground-glass density in the  lateral body of the left scapula corresponding to the region of  uptake on the prior bone scan. No associated cortical disruption or  periosteal reaction. 9 mm densely sclerotic focus in the right  scapula just inferior to the glenoid as well as a 9 mm sclerotic  focus in the T10 vertebral body, neither of which has associated  abnormal uptake on bone scan and with the T10 lesion being unchanged  from the prior abdominal CT, favoring a benign process such as bone  islands.   CT ABDOMEN PELVIS FINDINGS  Hepatobiliary: No focal liver abnormality is seen. Status post  cholecystectomy. No  biliary dilatation.  Pancreas: Unremarkable.  Spleen: Unremarkable.  Adrenals/Urinary Tract: Unremarkable adrenal glands. No evidence of  a renal mass, calculi, or hydronephrosis. Unremarkable bladder.  Stomach/Bowel: The stomach is unremarkable. There is no evidence of  bowel obstruction or inflammation. The appendix is unremarkable.  Vascular/Lymphatic: Normal caliber of the abdominal aorta. No  enlarged lymph nodes.  Reproductive: Uterus and bilateral adnexa are unremarkable.  Other: No ascites or pneumoperitoneum.  Musculoskeletal: No suspicious osseous lesion.   IMPRESSION:  1. 2 cm focus of faint ground-glass density in the left scapula  corresponding to the uptake on bone scan. 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 No  aggressive findings.  2. No other suspicious bone lesions or acute abnormality identified  in the chest, abdomen, or pelvis.   ASSESSMENT & PLAN:  A 36 y.o. female with leukocytosis.  She also has mild thrombocythemia.   When evaluating her labs today, her CBC shows these persistent findings.  However, with her acute drug use, this can be causing a reactive bodily response leading to her white cells and platelets being elevated.  I strongly recommended and encouraged her to seek help for her drug use as this will likely lead to both an increased morbidity and mortality over this calendar year if her lifestyle does not change.  I offered her the chance for our office to refer to a drug rehab facility, for which she declined.  None of her peripheral counts is dangerously elevated.  They will continue to be followed conservatively.  I will see her back in 6 months for repeat clinical assessment.  The patient understands all the plans discussed today and is in agreement with them.  Virginio Isidore Kirby Funk, MD

## 2022-05-22 ENCOUNTER — Inpatient Hospital Stay: Payer: Medicaid Other | Attending: Family Medicine

## 2022-05-22 ENCOUNTER — Ambulatory Visit: Payer: Self-pay | Admitting: Oncology

## 2022-06-15 ENCOUNTER — Ambulatory Visit (HOSPITAL_BASED_OUTPATIENT_CLINIC_OR_DEPARTMENT_OTHER): Payer: Medicaid Other | Attending: Pulmonary Disease | Admitting: Pulmonary Disease

## 2022-06-15 DIAGNOSIS — G4731 Primary central sleep apnea: Secondary | ICD-10-CM | POA: Insufficient documentation

## 2022-06-15 DIAGNOSIS — G4733 Obstructive sleep apnea (adult) (pediatric): Secondary | ICD-10-CM | POA: Insufficient documentation

## 2022-07-01 ENCOUNTER — Telehealth: Payer: Self-pay | Admitting: Pulmonary Disease

## 2022-07-01 NOTE — Telephone Encounter (Signed)
Call patient  Sleep study result  Date of study: 06/15/2022  Impression: Moderately severe obstructive sleep apnea  Recommendation: DME referral  Trial of BiPAP therapy on 17/13 cm H2O with a Medium size Resmed Full Face AirFit F30i mask and heated humidification.  Encourage weight loss measures  Follow-up in the office 4 to 6 weeks following initiation of treatment

## 2022-07-01 NOTE — Procedures (Signed)
POLYSOMNOGRAPHY  Last, First: Kathryn Holland, Kathryn Holland MRN: [id number] Gender: Female Age (years): 36 Weight (lbs): 200 DOB: [birth year] BMI: 30 Primary Care: No PCP Epworth Score: 15 Referring: Tomma Lightning MD Technician: Shelah Lewandowsky Interpreting: Tomma Lightning MD Study Type: BiPAP Ordered Study Type: BiPAP Study date: 06/15/2022 Location: [place] CLINICAL INFORMATION Flo Berroa is a 36 year old Female and was referred to the sleep center for evaluation of G47.33 OSA: Adult and Pediatric (327.23). Indications include Central Sleep Apnea, OSA.   Most recent polysomnogram dated 12/26/2021 revealed an AHI of 27.1/h and RDI of 79.5/h. Most recent titration study dated 12/26/2021 revealed an AHI of 58.1/h. MEDICATIONS Patient self administered medications include: N/A. Medications administered during study include No sleep medicine administered.  SLEEP STUDY TECHNIQUE The patient underwent an attended overnight polysomnography titration to assess the effects of BIPAP therapy. The following variables were monitored: EEG(C4-A1, C3-A2, O1-A2, O2-A1), EOG, submental and leg EMG, ECG, oxyhemoglobin saturation by pulse oximetry, thoracic and abdominal respiratory effort belts, nasal/oral airflow by pressure sensor, body position sensor and snoring sensor. BIPAP pressure was titrated to eliminate apneas, hypopneas and oxygen desaturation. Hypopneas were scored per AASM definition IB (4% desaturation)  TECHNICIAN COMMENTS Comments added by Technician: None Comments added by Scorer: N/A SLEEP ARCHITECTURE The study was initiated at 10:25:29 PM and terminated at 5:26:26 AM. Total recorded time was 420.9 minutes. EEG confirmed total sleep time was 396 minutes yielding a sleep efficiency of 94.1%%. Sleep onset after lights out was 13.7 minutes with a REM latency of 58.0 minutes. The patient spent 1.9%% of the night in stage N1 sleep, 55.2%% in stage N2 sleep, 6.9%% in stage N3 and 36% in  REM. The Arousal Index was 5.6/hour. RESPIRATORY PARAMETERS The overall AHI was 0.9 per hour, and the RDI was 2.3 events/hour with a central apnea index of 0per hour. The most appropriate setting of BiPAP was 17/13 cm H2O. At this setting, the sleep efficiency was 96 % and the patient was supine for 33%. The AHI was 0 events per hour, and the RDI was 0.7 events/hour (with 0 central events) and the arousal index was 5.6 per hour.The oxygen nadir was 93.0% during sleep.     The cumulative time under 88% oxygen saturation was 5.5 minutes  LEG MOVEMENT DATA The total leg movements were 0 with a resulting leg movement index of 0.0. Associated arousal with leg movement index was 0.0. CARDIAC DATA The underlying cardiac rhythm was most consistent with sinus rhythm. Mean heart rate during sleep was 70.6 bpm. Additional rhythm abnormalities include None.  IMPRESSIONS No Significant Obstructive Sleep apnea(OSA) Optimal pressure attained. EKG showed no cardiac abnormalities. Mild Oxygen Desaturation The patient snored with moderate snoring volume. No significant periodic leg movements(PLMs) during sleep. However, no significant associated arousals.  DIAGNOSIS Obstructive Sleep Apnea (G47.33)  RECOMMENDATIONS Trial of BiPAP therapy on 17/13 cm H2O with a Medium size Resmed Full Face AirFit F30i mask and heated humidification. Avoid alcohol, sedatives and other CNS depressants that may worsen sleep apnea and disrupt normal sleep architecture. Sleep hygiene should be reviewed to assess factors that may improve sleep quality. Weight management and regular exercise should be initiated or continued. Return to Sleep Center for re-evaluation after 4 weeks of therapy  [Electronically signed] 07/01/2022 01:03 PM  Virl Diamond MD NPI: [id number]

## 2022-07-05 NOTE — Telephone Encounter (Signed)
I called and was unable to leave a message.

## 2022-07-05 NOTE — Telephone Encounter (Signed)
Patient is calling to get the results of her sleep study.  She stated it was on 8/17 and she has not heard anything from the nurse or doctor.  Patient would prefer to be called at [phone]

## 2022-07-06 NOTE — Telephone Encounter (Signed)
Patient is returning phone call for sleep test results. Patient phone number is [phone].

## 2022-07-06 NOTE — Telephone Encounter (Signed)
I called and was unable to leave a message for her results.

## 2022-07-06 NOTE — Telephone Encounter (Signed)
Called and left voicemail for patient to call office back for sleep study results.  

## 2022-07-06 NOTE — Telephone Encounter (Signed)
Mychart message with results sent as well.

## 2022-07-07 NOTE — Telephone Encounter (Signed)
See phone note

## 2022-07-07 NOTE — Telephone Encounter (Signed)
Please verbally follow up with pt sleep study results as she doesn't have access to her mychart.

## 2022-07-13 ENCOUNTER — Telehealth: Payer: Self-pay | Admitting: Pulmonary Disease

## 2022-07-17 NOTE — Telephone Encounter (Signed)
Called patient and she would like that results of her sleep study that was done last month.   Please advise

## 2022-07-17 NOTE — Telephone Encounter (Signed)
Patient calling to get sleep study results which was done 8/17. Patient has been waiting for results for weeks. Please call back at [phone] or [phone]

## 2022-07-18 NOTE — Telephone Encounter (Signed)
Unable to leave a message and get in contact with the patient. Closing encounter.

## 2022-07-19 NOTE — Telephone Encounter (Signed)
Patient is calling again regarding her sleep study results.  She stated that she has left several messages with no return call.  Please call patient at [phone] or if no answer you can call [phone]

## 2022-08-16 ENCOUNTER — Telehealth: Payer: Self-pay

## 2022-08-16 NOTE — Telephone Encounter (Signed)
Will address at [facility] 08/17/22

## 2022-08-17 ENCOUNTER — Ambulatory Visit (INDEPENDENT_AMBULATORY_CARE_PROVIDER_SITE_OTHER): Payer: Medicaid Other | Admitting: Pulmonary Disease

## 2022-08-17 ENCOUNTER — Encounter: Payer: Self-pay | Admitting: Pulmonary Disease

## 2022-08-17 VITALS — BP 118/70 | HR 82 | Ht 67.0 in | Wt 197.4 lb

## 2022-08-17 DIAGNOSIS — R0609 Other forms of dyspnea: Secondary | ICD-10-CM

## 2022-08-17 DIAGNOSIS — G4733 Obstructive sleep apnea (adult) (pediatric): Secondary | ICD-10-CM

## 2022-08-17 MED ORDER — BUDESONIDE-FORMOTEROL FUMARATE 80-4.5 MCG/ACT IN AERO: 2.0000 | INHALATION_SPRAY | Freq: Two times a day (BID) | RESPIRATORY_TRACT | 5 refills | Status: DC

## 2022-08-17 MED ORDER — PRAZOSIN HCL 2 MG PO CAPS: 2.0000 mg | ORAL_CAPSULE | Freq: Every day | ORAL | 3 refills | Status: AC

## 2022-08-17 NOTE — Patient Instructions (Signed)
DME referral for change to BiPAP settings  Set BiPAP to 17/13 -I will see you in about 4 weeks with a download from the machine to make sure it is working  Prescription for Symbicort for your shortness of breath and wheezing sent to pharmacy  Schedule for pulmonary function test  Prescription for prazosin for nightmares -Start with 2 mg and go up by 2 mg every 2 to 3 days as tolerated -Watch out for lightheadedness or sleepiness, reduced dose to a tolerated dose if you are having concerns  We will see you in about 4 to 6 weeks

## 2022-08-17 NOTE — Progress Notes (Signed)
Kathryn Holland    [id number]    Dec 22, 1985  Primary Care Physician:Yates, Remigio Eisenmenger, PA  Referring Physician: Renaldo Reel, PA [address] [facility] [place],  [place] [address]  Chief complaint:   Patient with excessive daytime fatigue In for follow-up  HPI:  Still with significant fatigue  Has been having some nightmares  Trying to use BiPAP currently set on auto BiPAP Had a sleep study showing optimal pressures of 17/13  Continues to follow-up with a psychiatrist  still with significant central sleep apnea events and fatigue during the day  Patient tries to use BiPAP every night  She feels a little bit better with the pressure change  Has always dealt with fatigue History of depression with psychotic symptoms sometimes  Usually able to fall asleep easily, falls asleep in a few minutes, 3-4 awakenings Wake up time is variable  Weight remains stable Tries to stay active  Admits to dryness of her mouth in the mornings, no morning headaches She does have night sweats  Has a lot of nerve pain -On Neurontin 300 3 times daily-taking it less often, hydroxyzine 3 times daily, -Medications that may cause fatigue and sleepiness  She has used methamphetamines in the past, has also been on Vyvanse in the past   Outpatient Encounter Medications as of 08/17/2022  Medication Sig   azelastine (ASTELIN) 0.1 % nasal spray Place 1 spray into both nostrils 2 (two) times daily. Use in each nostril as directed   B Complex-C (B-COMPLEX WITH VITAMIN C) tablet Take 1 tablet by mouth daily.   budesonide-formoterol (SYMBICORT) 80-4.5 MCG/ACT inhaler Inhale 2 puffs into the lungs every 12 (twelve) hours.   buPROPion (WELLBUTRIN XL) 150 MG 24 hr tablet Take 150 mg by mouth daily.   Cholecalciferol 25 MCG (1000 UT) tablet Take by mouth.   etonogestrel (NEXPLANON) 68 MG IMPL implant 68 mg by Subdermal route once.    gabapentin (NEURONTIN) 600 MG tablet Take 1 tablet (600 mg total)  by mouth 3 (three) times daily.   hydrOXYzine (ATARAX) 10 MG tablet Take 10 mg by mouth 3 (three) times daily as needed.   meloxicam (MOBIC) 15 MG tablet meloxicam 15 mg tablet  Take 1 tablet every day by oral route.   naltrexone (DEPADE) 50 MG tablet Take 25 mg by mouth daily.   potassium chloride SA (KLOR-CON M) 20 MEQ tablet Take by mouth.   prazosin (MINIPRESS) 2 MG capsule Take 1 capsule (2 mg total) by mouth at bedtime. Start with 2 mg at night, increase to 4 mg after 2 to 3 days, increase to 6 mg after another 2 to 3 days Go back to a tolerated dose if you are have any lightheadedness or dizziness or increased sleepiness   [DISCONTINUED] doxepin (SINEQUAN) 25 MG capsule Take 1 capsule (25 mg total) by mouth at bedtime and may repeat dose one time if needed. For depression (Patient not taking: Reported on 03/10/2019)   [DISCONTINUED] famotidine (PEPCID) 20 MG tablet Take 1 tablet (20 mg total) by mouth 2 (two) times daily for 7 days.   [DISCONTINUED] loratadine (CLARITIN) 10 MG tablet Take 1 tablet (10 mg total) by mouth daily. For allergies (Patient not taking: Reported on 03/10/2019)   [DISCONTINUED] omeprazole (PRILOSEC) 40 MG capsule Take 1 capsule (40 mg total) by mouth daily. (Patient not taking: Reported on 05/20/2020)   No facility-administered encounter medications on file as of 08/17/2022.    Allergies as of 08/17/2022 -  Review Complete 08/17/2022  Allergen Reaction Noted   Butorphanol Anaphylaxis 02/01/2021   Stadol [butorphanol tartrate] Other (See Comments) 09/28/2011    Past Medical History:  Diagnosis Date   Gallstones    Kidney stone    Ovarian cyst    PUD (peptic ulcer disease)     Past Surgical History:  Procedure Laterality Date   LITHOTRIPSY      Family History  Problem Relation Age of Onset   Endometrial cancer Mother    Bipolar disorder Sister    Hypertension Father    Heart attack Father     Social History   Socioeconomic History   Marital  status: Single    Spouse name: Not on file   Number of children: 3   Years of education: 46 +  SOME COLLEGE   Highest education level: Not on file  Occupational History   Occupation: UNEMPLOYED  Tobacco Use   Smoking status: Former    Packs/day: 1.00    Years: 0.50    Total pack years: 0.50    Types: Cigarettes   Smokeless tobacco: Never   Tobacco comments:    QUIT THREE WEEKS AGO, She reports sometimes vaping.   Substance and Sexual Activity   Alcohol use: Not Currently    Comment: socially   Drug use: Yes    Types: Amphetamines   Sexual activity: Yes    Birth control/protection: None  Other Topics Concern   Not on file  Social History Narrative   Not on file   Social Determinants of Health   Financial Resource Strain: Not on file  Food Insecurity: Not on file  Transportation Needs: Not on file  Physical Activity: Not on file  Stress: Not on file  Social Connections: Not on file  Intimate Partner Violence: Not on file    Review of Systems  Constitutional:  Positive for fatigue.  Respiratory:  Positive for apnea and shortness of breath.   Psychiatric/Behavioral:  Positive for sleep disturbance.     Vitals:   08/17/22 0948  BP: 118/70  Pulse: 82  SpO2: 98%      Physical Exam Constitutional:      Appearance: She is obese.  HENT:     Head: Normocephalic.     Mouth/Throat:     Mouth: Mucous membranes are moist.     Comments: Crowded oropharynx with Mallampati 4 Eyes:     Pupils: Pupils are equal, round, and reactive to light.  Cardiovascular:     Rate and Rhythm: Normal rate and regular rhythm.     Heart sounds: No murmur heard.    No friction rub.  Pulmonary:     Effort: No respiratory distress.     Breath sounds: No stridor. No wheezing or rhonchi.  Musculoskeletal:     Cervical back: No rigidity or tenderness.  Skin:    Findings: No lesion or rash.  Neurological:     Mental Status: She is alert.  Psychiatric:        Mood and Affect: Mood  normal.        Behavior: Behavior normal.   Data Reviewed: Compliance data reviewed showing 90% compliance Auto CPAP, still having significant AHI  Recent sleep study reviewed showing optimal pressure of 17/13  Shortness of breath  Assessment:   Central sleep apnea -On BiPAP therapy -We will adjust BiPAP pressure settings to optimal pressures obtained in the sleep lab  Nightmares  Active smoker -Smoking cessation counseling  Plan/Recommendations: Change pressure settings to 17/13  Prazosin for nightmares  Prescription for Symbicort sent in  Schedule for pulmonary function test for shortness of breath  Follow-up in 3 months  Encouraged to call with significant concerns  Virl Diamond MD [facility] 08/17/2022, 10:58 AM  CC: Marlyn Corporal, PA

## 2022-09-29 ENCOUNTER — Ambulatory Visit (INDEPENDENT_AMBULATORY_CARE_PROVIDER_SITE_OTHER): Payer: Medicaid Other | Admitting: Pulmonary Disease

## 2022-09-29 ENCOUNTER — Encounter: Payer: Self-pay | Admitting: Pulmonary Disease

## 2022-09-29 VITALS — BP 112/78 | HR 116 | Temp 98.3°F | Ht 67.5 in | Wt 203.4 lb

## 2022-09-29 DIAGNOSIS — R0609 Other forms of dyspnea: Secondary | ICD-10-CM

## 2022-09-29 DIAGNOSIS — G4733 Obstructive sleep apnea (adult) (pediatric): Secondary | ICD-10-CM

## 2022-09-29 LAB — PULMONARY FUNCTION TEST
DL/VA % pred: 101 %
DL/VA: 4.47 ml/min/mmHg/L
DLCO cor % pred: 93 %
DLCO cor: 23.02 ml/min/mmHg
DLCO unc % pred: 93 %
DLCO unc: 23.02 ml/min/mmHg
FEF 25-75 Post: 4.57 L/sec
FEF 25-75 Pre: 4.1 L/sec
FEF2575-%Change-Post: 11 %
FEF2575-%Pred-Post: 131 %
FEF2575-%Pred-Pre: 117 %
FEV1-%Change-Post: 3 %
FEV1-%Pred-Post: 106 %
FEV1-%Pred-Pre: 102 %
FEV1-Post: 3.62 L
FEV1-Pre: 3.49 L
FEV1FVC-%Change-Post: 4 %
FEV1FVC-%Pred-Pre: 101 %
FEV6-%Change-Post: -1 %
FEV6-%Pred-Post: 99 %
FEV6-%Pred-Pre: 101 %
FEV6-Post: 4.06 L
FEV6-Pre: 4.11 L
FEV6FVC-%Pred-Post: 101 %
FEV6FVC-%Pred-Pre: 101 %
FVC-%Change-Post: 0 %
FVC-%Pred-Post: 99 %
FVC-%Pred-Pre: 99 %
FVC-Post: 4.09 L
FVC-Pre: 4.11 L
Post FEV1/FVC ratio: 89 %
Post FEV6/FVC ratio: 100 %
Pre FEV1/FVC ratio: 85 %
Pre FEV6/FVC Ratio: 100 %
RV % pred: 57 %
RV: 0.94 L
TLC % pred: 95 %
TLC: 5.26 L

## 2022-09-29 MED ORDER — BUDESONIDE-FORMOTEROL FUMARATE 80-4.5 MCG/ACT IN AERO: 2.0000 | INHALATION_SPRAY | Freq: Two times a day (BID) | RESPIRATORY_TRACT | 5 refills | Status: DC

## 2022-09-29 NOTE — Progress Notes (Signed)
Full PFT Performed Today  

## 2022-09-29 NOTE — Patient Instructions (Signed)
Continue using BiPAP  Continue using prazosin  Use Symbicort as needed  I will see you back in about 3 months  DME referral for CPAP supplies-to try different mask

## 2022-09-29 NOTE — Patient Instructions (Signed)
Full PFT Performed Today  

## 2022-09-29 NOTE — Progress Notes (Unsigned)
Kathryn Holland    [id number]    May 18, 1986  Primary Care Physician:Yates, Remigio Eisenmenger, PA  Referring Physician: Renaldo Reel, PA [address] [facility] [place],  [place] [address]  Chief complaint:   Patient with excessive daytime fatigue In for follow-up  HPI:  Fatigue is better  Nightmares better with use of prazosin  Still having some difficulty with tolerating the BiPAP  We did talk about interface change versus pressure changes She wants to remain on the same pressures at present but try a different mask  She had a pulmonary function test today which was normal  She has been using Symbicort Notices some improvement in her breathing with Symbicort  Has always dealt with fatigue History of depression with psychotic symptoms sometimes  Usually able to fall asleep easily, falls asleep in a few minutes, 3-4 awakenings Wake up time is variable  Weight remains stable Tries to stay active  Admits to dryness of her mouth in the mornings, no morning headaches She does have night sweats  Has a lot of nerve pain -On Neurontin 300 3 times daily-taking it less often, hydroxyzine 3 times daily, -Medications that may cause fatigue and sleepiness  She has used methamphetamines in the past, has also been on Vyvanse in the past   Outpatient Encounter Medications as of 09/29/2022  Medication Sig   azelastine (ASTELIN) 0.1 % nasal spray Place 1 spray into both nostrils 2 (two) times daily. Use in each nostril as directed   B Complex-C (B-COMPLEX WITH VITAMIN C) tablet Take 1 tablet by mouth daily.   budesonide-formoterol (SYMBICORT) 80-4.5 MCG/ACT inhaler Inhale 2 puffs into the lungs every 12 (twelve) hours.   buPROPion (WELLBUTRIN XL) 150 MG 24 hr tablet Take 150 mg by mouth daily.   Cholecalciferol 25 MCG (1000 UT) tablet Take by mouth.   etonogestrel (NEXPLANON) 68 MG IMPL implant 68 mg by Subdermal route once.    gabapentin (NEURONTIN) 600 MG tablet Take 1 tablet (600  mg total) by mouth 3 (three) times daily.   hydrOXYzine (ATARAX) 10 MG tablet Take 10 mg by mouth 3 (three) times daily as needed.   meloxicam (MOBIC) 15 MG tablet meloxicam 15 mg tablet  Take 1 tablet every day by oral route.   naltrexone (DEPADE) 50 MG tablet Take 25 mg by mouth daily.   potassium chloride SA (KLOR-CON M) 20 MEQ tablet Take by mouth.   prazosin (MINIPRESS) 2 MG capsule Take 1 capsule (2 mg total) by mouth at bedtime. Start with 2 mg at night, increase to 4 mg after 2 to 3 days, increase to 6 mg after another 2 to 3 days Go back to a tolerated dose if you are have any lightheadedness or dizziness or increased sleepiness   [DISCONTINUED] doxepin (SINEQUAN) 25 MG capsule Take 1 capsule (25 mg total) by mouth at bedtime and may repeat dose one time if needed. For depression (Patient not taking: Reported on 03/10/2019)   [DISCONTINUED] famotidine (PEPCID) 20 MG tablet Take 1 tablet (20 mg total) by mouth 2 (two) times daily for 7 days.   [DISCONTINUED] loratadine (CLARITIN) 10 MG tablet Take 1 tablet (10 mg total) by mouth daily. For allergies (Patient not taking: Reported on 03/10/2019)   [DISCONTINUED] omeprazole (PRILOSEC) 40 MG capsule Take 1 capsule (40 mg total) by mouth daily. (Patient not taking: Reported on 05/20/2020)   No facility-administered encounter medications on file as of 09/29/2022.    Allergies as of 09/29/2022 -  Review Complete 08/17/2022  Allergen Reaction Noted   Butorphanol Anaphylaxis 02/01/2021   Stadol [butorphanol tartrate] Other (See Comments) 09/28/2011    Past Medical History:  Diagnosis Date   Gallstones    Kidney stone    Ovarian cyst    PUD (peptic ulcer disease)     Past Surgical History:  Procedure Laterality Date   LITHOTRIPSY      Family History  Problem Relation Age of Onset   Endometrial cancer Mother    Bipolar disorder Sister    Hypertension Father    Heart attack Father     Social History   Socioeconomic History    Marital status: Single    Spouse name: Not on file   Number of children: 3   Years of education: 59 +  S[employer] COLLEGE   Highest education level: Not on file  Occupational History   Occupation: UNEMPLOYED  Tobacco Use   Smoking status: Former    Packs/day: 1.00    Years: 0.50    Total pack years: 0.50    Types: Cigarettes   Smokeless tobacco: Never   Tobacco comments:    QUIT THREE WEEKS AGO, She reports sometimes vaping.   Substance and Sexual Activity   Alcohol use: Not Currently    Comment: socially   Drug use: Yes    Types: Amphetamines   Sexual activity: Yes    Birth control/protection: None  Other Topics Concern   Not on file  Social History Narrative   Not on file   Social Determinants of Health   Financial Resource Strain: Not on file  Food Insecurity: Not on file  Transportation Needs: Not on file  Physical Activity: Not on file  Stress: Not on file  Social Connections: Not on file  Intimate Partner Violence: Not on file    Review of Systems  Constitutional:  Positive for fatigue.  Respiratory:  Positive for apnea and shortness of breath.   Psychiatric/Behavioral:  Positive for sleep disturbance.    There were no vitals filed for this visit.  Physical Exam Constitutional:      Appearance: She is obese.  HENT:     Head: Normocephalic.     Mouth/Throat:     Mouth: Mucous membranes are moist.     Comments: Crowded oropharynx with Mallampati 4 Eyes:     Pupils: Pupils are equal, round, and reactive to light.  Cardiovascular:     Rate and Rhythm: Normal rate and regular rhythm.     Heart sounds: No murmur heard.    No friction rub.  Pulmonary:     Effort: No respiratory distress.     Breath sounds: No stridor. No wheezing or rhonchi.  Musculoskeletal:     Cervical back: No rigidity or tenderness.  Skin:    Findings: No lesion or rash.  Neurological:     Mental Status: She is alert.  Psychiatric:        Mood and Affect: Mood normal.         Behavior: Behavior normal.    Data Reviewed: Compliance data reviewed showing 90% compliance Auto CPAP, still having significant AHI  Recent sleep study reviewed showing optimal pressure of 17/13  Shortness of breath  Assessment:   Central sleep apnea -On BiPAP therapy -We will adjust BiPAP pressure settings to optimal pressures obtained in the sleep lab  Nightmares  Active smoker -Smoking cessation counseling  Plan/Recommendations: Change pressure settings to 17/13  Prazosin for nightmares  Prescription for Symbicort sent in  Schedule for pulmonary function test for shortness of breath  Follow-up in 3 months  Encouraged to call with significant concerns  Virl Diamond MD [facility] 09/29/2022, 3:35 PM  CC: Marlyn Corporal, PA

## 2022-11-29 ENCOUNTER — Telehealth: Payer: Self-pay | Admitting: Pulmonary Disease

## 2022-11-29 DIAGNOSIS — G4733 Obstructive sleep apnea (adult) (pediatric): Secondary | ICD-10-CM

## 2022-11-29 NOTE — Telephone Encounter (Signed)
New order placed for new supplies for CPAP with [employer]. Called and updated patient that new order was placed for her supplies. Nothing further needed

## 2022-11-29 NOTE — Telephone Encounter (Signed)
Pls call PT regarding new supplies for Cpap needed and the insurance and the Cpap Co ([employer]) could not get in touch w/Dr. Ander Slade.  Pls call PT to advise. [phone]

## 2022-11-30 ENCOUNTER — Telehealth: Payer: Self-pay

## 2022-11-30 NOTE — Telephone Encounter (Signed)
Pt came into clinic asking if C-Pap supply order had been placed, which order was placed yesterday. Pt given contact information for [employer] and Leroy Sea was also left a voice mail to verifiy order was received by [employer].

## 2023-03-08 ENCOUNTER — Telehealth: Payer: Self-pay | Admitting: Pulmonary Disease

## 2023-03-08 NOTE — Telephone Encounter (Signed)
Patient states needs order for CPAP supplies. Patient uses [employer] [facility]. Patient phone number is [phone].

## 2023-03-09 NOTE — Telephone Encounter (Signed)
Dr. Val Eagle okay to place order for CPAP supplies

## 2023-03-12 NOTE — Telephone Encounter (Signed)
Okay to place orders for CPAP supplies

## 2023-03-13 ENCOUNTER — Other Ambulatory Visit: Payer: Self-pay

## 2023-03-13 DIAGNOSIS — G4733 Obstructive sleep apnea (adult) (pediatric): Secondary | ICD-10-CM

## 2023-03-13 NOTE — Telephone Encounter (Signed)
Order has been placed to [employer] for patient to receive cpap supplies. Patient is aware. NFN

## 2023-03-14 ENCOUNTER — Encounter: Payer: Self-pay | Admitting: Pulmonary Disease

## 2023-03-14 ENCOUNTER — Ambulatory Visit: Payer: Medicaid Other | Admitting: Pulmonary Disease

## 2023-03-14 VITALS — BP 118/68 | HR 60 | Ht 66.0 in | Wt 193.6 lb

## 2023-03-14 DIAGNOSIS — G4733 Obstructive sleep apnea (adult) (pediatric): Secondary | ICD-10-CM

## 2023-03-14 DIAGNOSIS — R0989 Other specified symptoms and signs involving the circulatory and respiratory systems: Secondary | ICD-10-CM | POA: Diagnosis not present

## 2023-03-14 DIAGNOSIS — G4731 Primary central sleep apnea: Secondary | ICD-10-CM | POA: Diagnosis not present

## 2023-03-14 DIAGNOSIS — F332 Major depressive disorder, recurrent severe without psychotic features: Secondary | ICD-10-CM

## 2023-03-14 NOTE — Patient Instructions (Addendum)
Follow-up in 3 months  DME referral -Needs new mask-tolerates ResMed AirFit F20 medium size better  ENT referral -Sensation of fullness in the throat  Continue prazosin at same dose  Continue Symbicort

## 2023-03-14 NOTE — Progress Notes (Signed)
Kathryn Holland    [id number]    June 19, 1986  Primary Care Physician:Foreman, Dorinda Hill, NP  Referring Physician: Marlyn Corporal, PA [address] [facility] [place],  [place] [address]  Chief complaint:   Patient with excessive daytime fatigue In for follow-up  HPI:  Fatigue is stable  Nightmares is stable Discussed dosing of prazosin-possibly increasing dose but does have some wooziness in the morning So, we will leave the dose the same at present  Continues to tolerate BiPAP Number of hours of sleep varies at night at night sometimes sleeps up to 1011 hrs., sometimes only sleeps 5 to 6 hours  Having some mask issues -Appears to tolerate the ResMed AirFit F20 medium size mask the best  Continues on Symbicort Recent PFT was normal  Still does have issues with her fibromyalgia with associated fatigue Usually able to fall asleep easily, falls asleep in a few minutes, 3-4 awakenings  Weight has been stable  Admits to dryness of her mouth in the mornings, no morning headaches She does have night sweats  Has a lot of nerve pain -Continues on Neurontin  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 Use in each nostril as directed   B Complex-C (B-COMPLEX WITH VITAMIN C) tablet Take 1 tablet by mouth daily.   budesonide-formoterol (SYMBICORT) 80-4.5 MCG/ACT inhaler Inhale 2 puffs into the lungs every 12 (twelve) hours.   etonogestrel (NEXPLANON) 68 MG IMPL implant 68 mg by Subdermal route once.    gabapentin (NEURONTIN) 600 MG tablet Take 1 tablet (600 mg total) by mouth 3 (three) times daily.   hydrOXYzine (ATARAX) 10 MG tablet Take 10 mg by mouth 3 (three) times daily as needed.   meloxicam (MOBIC) 15 MG tablet meloxicam 15 mg tablet  Take 1 tablet every day by oral  route.   naproxen sodium (ALEVE) 220 MG tablet Take 220 mg by mouth.   pantoprazole (PROTONIX) 20 MG tablet Take 20 mg by mouth daily.   potassium chloride SA (KLOR-CON M) 20 MEQ tablet Take by mouth.   prazosin (MINIPRESS) 2 MG capsule Take 1 capsule (2 mg total) by mouth at bedtime. Start with 2 mg at night, increase to 4 mg after 2 to 3 days, increase to 6 mg after another 2 to 3 days Go back to a tolerated dose if you are have any lightheadedness or dizziness or increased sleepiness   triamcinolone cream (KENALOG) 0.1 % Apply 1 Application topically 2 (two) times daily.   VRAYLAR 1.5 MG capsule Take 1.5 mg by mouth daily.   [DISCONTINUED] doxepin (SINEQUAN) 25 MG capsule Take 1 capsule (25 mg total) by mouth at bedtime and may repeat dose one time if needed. For depression (Patient not taking: Reported on 03/10/2019)   [DISCONTINUED] famotidine (PEPCID) 20 MG tablet Take 1 tablet (20 mg total) by mouth 2 (two) times daily for 7 days.   [DISCONTINUED] loratadine (CLARITIN) 10 MG tablet Take 1 tablet (10 mg total) by mouth daily. For allergies (Patient not taking: Reported on 03/10/2019)   [DISCONTINUED] omeprazole (PRILOSEC) 40 MG capsule Take 1 capsule (40 mg total) by mouth daily. (Patient not taking: Reported on 05/20/2020)   No facility-administered encounter medications on file as of 03/14/2023.    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   Stadol [butorphanol tartrate] Other (See Comments) 09/28/2011    Past Medical History:  Diagnosis Date   Gallstones    Kidney stone    Ovarian cyst    PUD (peptic ulcer disease)     Past Surgical History:  Procedure Laterality Date   LITHOTRIPSY      Family History  Problem Relation Age of Onset   Endometrial cancer Mother    Bipolar disorder Sister    Hypertension Father    Heart attack Father     Social History   Socioeconomic History   Marital status:  Single    Spouse name: Not on file   Number of children: 3   Years of education: 72 +  [employer]   Highest education level: Not on file  Occupational History   Occupation: UNEMPLOYED  Tobacco Use   Smoking status: Former    Packs/day: 1.00    Years: 0.50    Additional pack years: 0.00    Total pack years: 0.50    Types: Cigarettes    Quit date: 2022    Years since quitting: 2.3    Passive exposure: Past   Smokeless tobacco: Never   Tobacco comments:    QUIT THREE WEEKS AGO, She reports sometimes vaping.   Substance and Sexual Activity   Alcohol use: Not Currently    Comment: socially   Drug use: Yes    Types: Amphetamines   Sexual activity: Yes    Birth control/protection: None  Other Topics Concern   Not on file  Social History Narrative   Not on file   Social Determinants of Health   Financial Resource Strain: Not on file  Food Insecurity: Not on file  Transportation Needs: Not on file  Physical Activity: Not on file  Stress: Not on file  Social Connections: Not on file  Intimate Partner Violence: Not on file    Review of Systems  Constitutional:  Positive for fatigue.  Respiratory:  Positive for apnea and shortness of breath.   Psychiatric/Behavioral:  Positive for sleep disturbance.    There were no vitals filed for this visit.  Physical Exam Constitutional:      Appearance: She is obese.  HENT:     Head: Normocephalic.     Mouth/Throat:     Mouth: Mucous membranes are moist.     Comments: Crowded oropharynx with Mallampati 4 Eyes:     Pupils: Pupils are equal, round, and reactive to light.  Cardiovascular:     Rate and Rhythm: Normal rate and regular rhythm.     Heart sounds: No murmur heard.    No friction rub.  Pulmonary:     Effort: No respiratory distress.     Breath sounds: No stridor. No wheezing or rhonchi.  Musculoskeletal:     Cervical back: No rigidity or tenderness.  Skin:    Findings: No lesion or rash.  Neurological:      Mental Status: She is alert.  Psychiatric:        Mood and Affect: Mood normal.        Behavior: Behavior normal.    Data Reviewed: Download not available at present  Having some mask issues  PFT reviewed and is within normal limits  Assessment:   Central sleep apnea -Continue CPAP therapy -DME referral for assistance with a new mask  Nightmares appears improved with prazosin -Prefers to stay on same dose at present -  Only on 2 mg of prazosin  Active smoker -Smoking cessation counseling  Sensation of globus -I did not appreciate an enlarged tonsil -She feels that is worsening -Will refer to ENT  Plan/Recommendations: Continue CPAP therapy  Continue prazosin  Continue Symbicort  ENT referral for sensation of globus  Follow-up in 3 months  DME referral for new mask  Graded activities as tolerated  Weight loss efforts as tolerated  Encouraged to call with significant concerns  I spent 30 minutes dedicated to the care of this patient on the date of this encounter to include previsit review of records, face-to-face time with the patient discussing conditions above, post visit ordering of testing, clinical documentation with electronic health record and communicated necessary findings to members of the patient's care team  Virl Diamond MD [facility] 03/14/2023, 8:45 AM  CC: Marlyn Corporal, PA

## 2023-03-14 NOTE — Addendum Note (Signed)
Addended by: Lanna Poche on: 03/14/2023 09:43 AM   Modules accepted: Orders

## 2023-04-30 ENCOUNTER — Other Ambulatory Visit: Payer: Self-pay

## 2023-04-30 ENCOUNTER — Emergency Department (HOSPITAL_COMMUNITY)
Admission: EM | Admit: 2023-04-30 | Discharge: 2023-05-01 | Disposition: A | Discharge status: Home / Self Care | Payer: MEDICAID | Attending: Emergency Medicine | Admitting: Emergency Medicine

## 2023-04-30 ENCOUNTER — Encounter (HOSPITAL_COMMUNITY): Payer: Self-pay | Admitting: Emergency Medicine

## 2023-04-30 DIAGNOSIS — D72829 Elevated white blood cell count, unspecified: Secondary | ICD-10-CM | POA: Insufficient documentation

## 2023-04-30 DIAGNOSIS — R1012 Left upper quadrant pain: Secondary | ICD-10-CM | POA: Insufficient documentation

## 2023-04-30 DIAGNOSIS — R1032 Left lower quadrant pain: Secondary | ICD-10-CM | POA: Diagnosis not present

## 2023-04-30 LAB — CBC
HCT: 47.4 % — ABNORMAL HIGH (ref 36.0–46.0)
Hemoglobin: 15.9 g/dL — ABNORMAL HIGH (ref 12.0–15.0)
MCH: 28.5 pg (ref 26.0–34.0)
MCHC: 33.5 g/dL (ref 30.0–36.0)
MCV: 84.9 fL (ref 80.0–100.0)
Platelets: 459 10*3/uL — ABNORMAL HIGH (ref 150–400)
RBC: 5.58 MIL/uL — ABNORMAL HIGH (ref 3.87–5.11)
RDW: 12.8 % (ref 11.5–15.5)
WBC: 13.8 10*3/uL — ABNORMAL HIGH (ref 4.0–10.5)
nRBC: 0 % (ref 0.0–0.2)

## 2023-04-30 NOTE — ED Triage Notes (Signed)
Pt c/o LUQ abdominal pain with N/V x 3 days. Pt states that she had one episode of bright red vaginal bleeding 2 days ago.

## 2023-05-01 ENCOUNTER — Other Ambulatory Visit: Payer: Self-pay | Admitting: Pulmonary Disease

## 2023-05-01 ENCOUNTER — Encounter (HOSPITAL_COMMUNITY): Payer: Self-pay

## 2023-05-01 ENCOUNTER — Emergency Department (HOSPITAL_COMMUNITY): Payer: MEDICAID

## 2023-05-01 LAB — URINALYSIS, ROUTINE W REFLEX MICROSCOPIC
Bilirubin Urine: NEGATIVE
Glucose, UA: NEGATIVE mg/dL
Hgb urine dipstick: NEGATIVE
Ketones, ur: NEGATIVE mg/dL
Leukocytes,Ua: NEGATIVE
Nitrite: NEGATIVE
Protein, ur: NEGATIVE mg/dL
Specific Gravity, Urine: 1.005 (ref 1.005–1.030)
pH: 6 (ref 5.0–8.0)

## 2023-05-01 LAB — COMPREHENSIVE METABOLIC PANEL
ALT: 30 U/L (ref 0–44)
AST: 24 U/L (ref 15–41)
Albumin: 4.1 g/dL (ref 3.5–5.0)
Alkaline Phosphatase: 78 U/L (ref 38–126)
Anion gap: 10 (ref 5–15)
BUN: 12 mg/dL (ref 6–20)
CO2: 19 mmol/L — ABNORMAL LOW (ref 22–32)
Calcium: 9.1 mg/dL (ref 8.9–10.3)
Chloride: 105 mmol/L (ref 98–111)
Creatinine, Ser: 0.7 mg/dL (ref 0.44–1.00)
GFR, Estimated: >60 mL/min (ref >60)
Glucose, Bld: 107 mg/dL — ABNORMAL HIGH (ref 70–99)
Potassium: 3.2 mmol/L — ABNORMAL LOW (ref 3.5–5.1)
Sodium: 134 mmol/L — ABNORMAL LOW (ref 135–145)
Total Bilirubin: 0.5 mg/dL (ref 0.3–1.2)
Total Protein: 7.9 g/dL (ref 6.5–8.1)

## 2023-05-01 LAB — LIPASE, BLOOD: Lipase: 33 U/L (ref 11–51)

## 2023-05-01 LAB — HCG, SERUM, QUALITATIVE: Preg, Serum: NEGATIVE

## 2023-05-01 MED ORDER — FAMOTIDINE 20 MG PO TABS: 20.0000 mg | ORAL_TABLET | Freq: Two times a day (BID) | ORAL | 0 refills | Status: AC

## 2023-05-01 MED ORDER — ONDANSETRON HCL 4 MG/2ML IJ SOLN
4.0000 mg | Freq: Once | INTRAMUSCULAR | Status: AC
  Administered 2023-05-01: 4 mg via INTRAVENOUS
  Filled 2023-05-01: qty 2

## 2023-05-01 MED ORDER — DICYCLOMINE HCL 20 MG PO TABS: 20.0000 mg | ORAL_TABLET | Freq: Two times a day (BID) | ORAL | 0 refills | Status: AC

## 2023-05-01 MED ORDER — SODIUM CHLORIDE 0.9 % IV BOLUS
1000.0000 mL | Freq: Once | INTRAVENOUS | Status: AC
  Administered 2023-05-01: 1000 mL via INTRAVENOUS

## 2023-05-01 MED ORDER — SODIUM CHLORIDE (PF) 0.9 % IJ SOLN
INTRAMUSCULAR | Status: AC
  Filled 2023-05-01: qty 50

## 2023-05-01 MED ORDER — FAMOTIDINE 20 MG PO TABS
20.0000 mg | ORAL_TABLET | Freq: Once | ORAL | Status: AC
  Administered 2023-05-01: 20 mg via ORAL
  Filled 2023-05-01: qty 1

## 2023-05-01 MED ORDER — HYDROMORPHONE HCL 1 MG/ML IJ SOLN
1.0000 mg | Freq: Once | INTRAMUSCULAR | Status: AC
  Administered 2023-05-01: 1 mg via INTRAVENOUS
  Filled 2023-05-01: qty 1

## 2023-05-01 MED ORDER — IOHEXOL 300 MG/ML  SOLN
100.0000 mL | Freq: Once | INTRAMUSCULAR | Status: AC | PRN
  Administered 2023-05-01: 100 mL via INTRAVENOUS

## 2023-05-01 MED ORDER — ONDANSETRON HCL 4 MG PO TABS: 4.0000 mg | ORAL_TABLET | Freq: Four times a day (QID) | ORAL | 0 refills | Status: AC

## 2023-05-01 NOTE — ED Notes (Signed)
Pt educated on DC instructions and verbalizes understanding, pt appears to be in NAD at time of DC, pt being taken home by friend.

## 2023-05-01 NOTE — Discharge Instructions (Addendum)
Lab workup and imaging were reassuring, and given you Zofran for nausea, Pepcid for stomach acid, and Bentyl for stomach spasms.  I recommend a bland diet you may advance it as tolerated.  Please follow-up with your PCP for further assessment  Come back to the emergency department if you develop chest pain, shortness of breath, severe abdominal pain, uncontrolled nausea, vomiting, diarrhea.

## 2023-05-01 NOTE — ED Provider Notes (Signed)
[place] [facility] AT [facility] Provider Note   CSN: [id number] Arrival date & time: 04/30/23  2216     History  Chief Complaint  Patient presents with   Abdominal Pain    Kathryn Holland is a 37 y.o. female.  HPI   Patient with medical history including kidney stone, ovarian cyst, PUD, alcohol dependency, presenting with complaints of left abdominal pain, start about 3 days ago, states has been constant, mainly in her left upper quadrant, does not radiate, associated nausea vomiting, denies any bloody emesis or coffee-ground emesis, states he is having loose stools, had 1 episode where she had blood on the toilet paper after wiping, there is no blood in the actual stool itself no dark tarry stools no history of GI bleeds, denies excessive NSAID use.  She admits that she had started to drink again and will drink about 3 "boot leggers" every other day, has had be hospitalized for withdrawals in the past.  She also notes that she had a day of vaginal bleeding, this was 2 days ago, this is a singular episode, no associated vaginal discharge vaginal or pelvic pain, she states she is sexually active, she is in monogamous relationship, not on birth control, last menstrual cycle was 2 weeks ago, no history of ectopic pregnancies or ovarian torsion's.  Patient denies any cough congestion chest pain or pleuritic chest pain no history of PEs or DVTs.  Reviewed patient's chart has had cholecystectomy, has never been evaluated by GI or had formal colonoscopy performed.  Home Medications Prior to Admission medications   Medication Sig Start Date End Date Taking? Authorizing Provider  dicyclomine (BENTYL) 20 MG tablet Take 1 tablet (20 mg total) by mouth 2 (two) times daily. 05/01/23  Yes Carroll Sage, PA-C  famotidine (PEPCID) 20 MG tablet Take 1 tablet (20 mg total) by mouth 2 (two) times daily. 05/01/23  Yes Carroll Sage, PA-C  ondansetron (ZOFRAN) 4 MG tablet  Take 1 tablet (4 mg total) by mouth every 6 (six) hours. 05/01/23  Yes Carroll Sage, PA-C  azelastine (ASTELIN) 0.1 % nasal spray Place 1 spray into both nostrils 2 (two) times daily. Use in each nostril as directed 02/28/22   Virl Diamond A, MD  B Complex-C (B-COMPLEX WITH VITAMIN C) tablet Take 1 tablet by mouth daily.    [provider]  budesonide-formoterol (SYMBICORT) 80-4.5 MCG/ACT inhaler Inhale 2 puffs into the lungs every 12 (twelve) hours. 09/29/22   Olalere, Minna Antis, MD  etonogestrel (NEXPLANON) 68 MG IMPL implant 68 mg by Subdermal route once.  11/28/12   [provider]  gabapentin (NEURONTIN) 600 MG tablet Take 1 tablet (600 mg total) by mouth 3 (three) times daily. 04/11/22   Tomma Lightning, MD  hydrOXYzine (ATARAX) 10 MG tablet Take 10 mg by mouth 3 (three) times daily as needed.    [provider]  meloxicam (MOBIC) 15 MG tablet meloxicam 15 mg tablet  Take 1 tablet every day by oral route.    [provider]  naproxen sodium (ALEVE) 220 MG tablet Take 220 mg by mouth.    [provider]  pantoprazole (PROTONIX) 20 MG tablet Take 20 mg by mouth daily. 09/22/22   [provider]  potassium chloride SA (KLOR-CON M) 20 MEQ tablet Take by mouth. 09/11/19   [provider]  prazosin (MINIPRESS) 2 MG capsule Take 1 capsule (2 mg total) by mouth at bedtime. Start with 2 mg at night,  increase to 4 mg after 2 to 3 days, increase to 6 mg after another 2 to 3 days Go back to a tolerated dose if you are have any lightheadedness or dizziness or increased sleepiness 08/17/22   Virl Diamond A, MD  sertraline (ZOLOFT) 50 MG tablet Take by mouth. 02/21/23   [provider]  triamcinolone cream (KENALOG) 0.1 % Apply 1 Application topically 2 (two) times daily. 09/22/22   [provider]  VRAYLAR 1.5 MG capsule Take 1.5 mg by mouth daily. 09/22/22   [provider]  doxepin (SINEQUAN) 25 MG capsule  Take 1 capsule (25 mg total) by mouth at bedtime and may repeat dose one time if needed. For depression Patient not taking: Reported on 03/10/2019 03/28/16 09/11/19  Armandina Stammer I, NP  loratadine (CLARITIN) 10 MG tablet Take 1 tablet (10 mg total) by mouth daily. For allergies Patient not taking: Reported on 03/10/2019 03/28/16 09/11/19  Armandina Stammer I, NP  omeprazole (PRILOSEC) 40 MG capsule Take 1 capsule (40 mg total) by mouth daily. Patient not taking: Reported on 05/20/2020 09/11/19 08/15/20  Molpus, John, MD      Allergies    Butorphanol and Stadol [butorphanol tartrate]    Review of Systems   Review of Systems  Constitutional:  Negative for chills and fever.  Respiratory:  Negative for shortness of breath.   Cardiovascular:  Negative for chest pain.  Gastrointestinal:  Positive for abdominal pain, nausea and vomiting.  Neurological:  Negative for headaches.    Physical Exam Updated Vital Signs BP 104/74 (BP Location: Right Arm)   Pulse 61   Temp 98.6 F (37 C) (Oral)   Resp 18   SpO2 95%  Physical Exam Vitals and nursing note reviewed.  Constitutional:      General: She is not in acute distress.    Appearance: She is not ill-appearing.  HENT:     Head: Normocephalic and atraumatic.     Nose: No congestion.  Eyes:     Conjunctiva/sclera: Conjunctivae normal.  Cardiovascular:     Rate and Rhythm: Normal rate and regular rhythm.     Pulses: Normal pulses.     Heart sounds: No murmur heard.    No friction rub. No gallop.  Pulmonary:     Effort: No respiratory distress.     Breath sounds: No wheezing, rhonchi or rales.  Abdominal:     Palpations: Abdomen is soft.     Tenderness: There is abdominal tenderness.     Comments: Abdomen nondistended, soft, she had minimal tenderness in the left upper quadrant, as well as the left lower quadrant, there is no guarding rebound tenderness or peritoneal sign negative flank tenderness.  Musculoskeletal:     Comments: No  unilateral leg swelling no calf tenderness no palpable cords.  Skin:    General: Skin is warm and dry.  Neurological:     Mental Status: She is alert.  Psychiatric:        Mood and Affect: Mood normal.     Comments: She is nontremulous on my exam, responding appropriately, no evidence of withdrawals.     ED Results / Procedures / Treatments   Labs (all labs ordered are listed, but only abnormal results are displayed) Labs Reviewed  COMPREHENSIVE METABOLIC PANEL - Abnormal; Notable for the following components:      Result Value   Sodium 134 (*)    Potassium 3.2 (*)    CO2 19 (*)    Glucose, Bld 107 (*)  All other components within normal limits  CBC - Abnormal; Notable for the following components:   WBC 13.8 (*)    RBC 5.58 (*)    Hemoglobin 15.9 (*)    HCT 47.4 (*)    Platelets 459 (*)    All other components within normal limits  URINALYSIS, ROUTINE W REFLEX MICROSCOPIC - Abnormal; Notable for the following components:   APPearance HAZY (*)    All other components within normal limits  LIPASE, BLOOD  HCG, SERUM, QUALITATIVE    EKG None  Radiology US Pelvis Complete  Result Date: 05/01/2023 CLINICAL DATA:  Left lower quadrant pain for 2 days. EXAM: TRANSABDOMINAL AND TRANSVAGINAL ULTRASOUND OF PELVIS DOPPLER ULTRASOUND OF OVARIES TECHNIQUE: Both transabdominal and transvaginal ultrasound examinations of the pelvis were performed. Transabdominal technique was performed for global imaging of the pelvis including uterus, ovaries, adnexal regions, and pelvic cul-de-sac. It was necessary to proceed with endovaginal exam following the transabdominal exam to visualize the anatomy. Color and duplex Doppler ultrasound was utilized to evaluate blood flow to the ovaries. COMPARISON:  05/01/2023. FINDINGS: Uterus Measurements: 8.6 x 3.8 x 5.2 cm = volume: 88.8 mL. No fibroids or other mass visualized. The uterus has a heterogeneous echotexture. Endometrium Thickness: 5.9 mm.  No  focal abnormality visualized. Right ovary Measurements: 3.5 x 2.9 x 2.8 cm = volume: 13.6 mL. Normal appearance/no adnexal mass. Left ovary Measurements: 3.3 x 1.9 x 2.0 cm = volume: 7.6 mL. Normal appearance/no adnexal mass. Pulsed Doppler evaluation of both ovaries demonstrates normal low-resistance arterial and venous waveforms. Other findings No abnormal free fluid. IMPRESSION: No evidence of ovarian torsion or other acute process. Electronically Signed   By: Thornell Sartorius M.D.   On: 05/01/2023 04:24   US Transvaginal Non-OB  Result Date: 05/01/2023 CLINICAL DATA:  Left lower quadrant pain for 2 days. EXAM: TRANSABDOMINAL AND TRANSVAGINAL ULTRASOUND OF PELVIS DOPPLER ULTRASOUND OF OVARIES TECHNIQUE: Both transabdominal and transvaginal ultrasound examinations of the pelvis were performed. Transabdominal technique was performed for global imaging of the pelvis including uterus, ovaries, adnexal regions, and pelvic cul-de-sac. It was necessary to proceed with endovaginal exam following the transabdominal exam to visualize the anatomy. Color and duplex Doppler ultrasound was utilized to evaluate blood flow to the ovaries. COMPARISON:  05/01/2023. FINDINGS: Uterus Measurements: 8.6 x 3.8 x 5.2 cm = volume: 88.8 mL. No fibroids or other mass visualized. The uterus has a heterogeneous echotexture. Endometrium Thickness: 5.9 mm.  No focal abnormality visualized. Right ovary Measurements: 3.5 x 2.9 x 2.8 cm = volume: 13.6 mL. Normal appearance/no adnexal mass. Left ovary Measurements: 3.3 x 1.9 x 2.0 cm = volume: 7.6 mL. Normal appearance/no adnexal mass. Pulsed Doppler evaluation of both ovaries demonstrates normal low-resistance arterial and venous waveforms. Other findings No abnormal free fluid. IMPRESSION: No evidence of ovarian torsion or other acute process. Electronically Signed   By: Thornell Sartorius M.D.   On: 05/01/2023 04:24   [place] Art/Ven Flow Abd Pelv Doppler  Result Date: 05/01/2023 CLINICAL DATA:  Left  lower quadrant pain for 2 days. EXAM: TRANSABDOMINAL AND TRANSVAGINAL ULTRASOUND OF PELVIS DOPPLER ULTRASOUND OF OVARIES TECHNIQUE: Both transabdominal and transvaginal ultrasound examinations of the pelvis were performed. Transabdominal technique was performed for global imaging of the pelvis including uterus, ovaries, adnexal regions, and pelvic cul-de-sac. It was necessary to proceed with endovaginal exam following the transabdominal exam to visualize the anatomy. Color and duplex Doppler ultrasound was utilized to evaluate blood flow to the ovaries. COMPARISON:  05/01/2023. FINDINGS: Uterus  Measurements: 8.6 x 3.8 x 5.2 cm = volume: 88.8 mL. No fibroids or other mass visualized. The uterus has a heterogeneous echotexture. Endometrium Thickness: 5.9 mm.  No focal abnormality visualized. Right ovary Measurements: 3.5 x 2.9 x 2.8 cm = volume: 13.6 mL. Normal appearance/no adnexal mass. Left ovary Measurements: 3.3 x 1.9 x 2.0 cm = volume: 7.6 mL. Normal appearance/no adnexal mass. Pulsed Doppler evaluation of both ovaries demonstrates normal low-resistance arterial and venous waveforms. Other findings No abnormal free fluid. IMPRESSION: No evidence of ovarian torsion or other acute process. Electronically Signed   By: Thornell Sartorius M.D.   On: 05/01/2023 04:24   CT ABDOMEN PELVIS W CONTRAST  Result Date: 05/01/2023 CLINICAL DATA:  Left lower quadrant abdominal pain, nausea/vomiting EXAM: CT ABDOMEN AND PELVIS WITH CONTRAST TECHNIQUE: Multidetector CT imaging of the abdomen and pelvis was performed using the standard protocol following bolus administration of intravenous contrast. RADIATION DOSE REDUCTION: This exam was performed according to the departmental dose-optimization program which includes automated exposure control, adjustment of the mA and/or kV according to patient size and/or use of iterative reconstruction technique. CONTRAST:  OMNIPAQUE IOHEXOL 300 MG/ML  SOLN COMPARISON:  11/16/2021  FINDINGS: Lower chest: Mild dependent atelectasis in the bilateral lower lobes. Hepatobiliary: Liver is within normal limits. Status post cholecystectomy. No intrahepatic or extrahepatic duct dilatation. Pancreas: Within normal limits. Spleen: Within normal limits. Adrenals/Urinary Tract: Adrenal glands are normal limits. Kidneys are within normals.  No hydronephrosis. Bladder is underdistended but unremarkable. Stomach/Bowel: Stomach is within normal limits. No evidence of bowel obstruction. Normal appendix (series 2/image 62). No colonic wall thickening or inflammatory changes. Vascular/Lymphatic: No evidence of abdominal aortic aneurysm. No suspicious abdominopelvic lymphadenopathy. Reproductive: Uterus within normal limits. Bilateral ovaries are within normal limits. Other: No abdominopelvic ascites. Small fat containing bilateral inguinal hernias. Musculoskeletal: Stable sclerotic lesion in the T10 vertebral body (sagittal image 93), favoring a benign bone island. IMPRESSION: Negative CT abdomen/pelvis. Electronically Signed   By: Charline Bills M.D.   On: 05/01/2023 02:52    Procedures Procedures    Medications Ordered in ED Medications  sodium chloride 0.9 % bolus 1,000 mL (0 mLs Intravenous Stopped 05/01/23 0339)  ondansetron (ZOFRAN) injection 4 mg (4 mg Intravenous Given 05/01/23 0156)  HYDROmorphone (DILAUDID) injection 1 mg (1 mg Intravenous Given 05/01/23 0156)  famotidine (PEPCID) tablet 20 mg (20 mg Oral Given 05/01/23 0157)  iohexol (OMNIPAQUE) 300 MG/ML solution 100 mL (100 mLs Intravenous Contrast Given 05/01/23 0229)    ED Course/ Medical Decision Making/ A&P                             Medical Decision Making Amount and/or Complexity of Data Reviewed Labs: ordered. Radiology: ordered.  Risk Prescription drug management.   This patient presents to the ED for concern of abdominal pain, this involves an extensive number of treatment options, and is a complaint that carries with  it a high risk of complications and morbidity.  The differential diagnosis includes ectopic pregnancy, ovarian torsion, diverticulitis, bowel obstruction, volvulus, UTI, PUD,    Additional history obtained:  Additional history obtained from N/A External records from outside source obtained and reviewed including surgical notes   Co morbidities that complicate the patient evaluation  Alcohol dependency  Social Determinants of Health:  N/A    Lab Tests:  I Ordered, and personally interpreted labs.  The pertinent results include: CBC shows leukocytosis of 13.8, hemoglobin 15.9, CMP reveals  sodium 134 potassium 3.2 CO2 of 19 glucose 107 lipase 33 hCG negative UA unremarkable   Imaging Studies ordered:  I ordered imaging studies including CT abdomen pelvis I independently visualized and interpreted imaging which showed CT scan unremarkable I agree with the radiologist interpretation   Cardiac Monitoring:  The patient was maintained on a cardiac monitor.  I personally viewed and interpreted the cardiac monitored which showed an underlying rhythm of: N/A   Medicines ordered and prescription drug management:  I ordered medication including fluids, pain medication, I have reviewed the patients home medicines and have made adjustments as needed  Critical Interventions:  N/A   Reevaluation:  Presents with left-sided abdominal pain, triggers obtain basic lab workup which I personally reviewed, unremarkable, patient had a slight tenderness on the left upper quadrants and minimal left lower quadrant, will obtain CT imaging for further evaluation.    I offered rectal exam for further evaluation of bloody stools but patient deferred, I also offered evaluated for vaginal bleeding with STI testing, but patient deferred this as well.  On reassessment abdomen was soft nontender, very minimal pain in the left lower quadrant, patient is concerned that she could have a torsion since she  has a history of ovarian cyst, I do not see any large adnexal mass on my exam but I do not find unreasonable to further assess with ultrasound.  Will obtain imaging for further assessment.   Transvaginal ultrasound was negative, patient was updated on these results, she is resting comfortably, vital signs remained stable, she is having no pain at this time, discussed possible STD testing but patient deferred.  She is agreement discharge at this time  Consultations Obtained:  N/A    Test Considered:  Digital rectal exam, STD testing-patient deferred on both.    Rule out Suspicion for ectopic pregnancy is low as urine pregnancy is negative.  I doubt ovarian torsion as ultrasound is negative.  I doubt PID as patient denies vaginal discharge vaginal bleeding, she has no urinary symptoms.  Doubt UTI [place] or kidney stone not endorsing any urinary symptoms UA is negative for infection or hematuria.  I doubt lower lobe pneumonia as lung sounds are clear bilaterally no endorsing cough or congestion.  I doubt PE as she is PERC negative.  I doubt diverticulitis, bowel obstruction, volvulus, intra-abdominal mass/infection CT imaging is all negative these findings.  It is noted that patient's white count was slightly elevated but patient did appear dry on my exam and her RBC and hemoglobin is also elevated suspect this is likely hemoconcentrated so my suspicion for actual leukocytosis is lower.  I doubt active GI bleed is she denies bloody stools or dark tarry stools no bloody emesis or coffee-ground emesis, she did state that she had blood on the toilet paper 2 days ago, likely from hemorrhoids, she is not on any anticoag's, she is seen medically stable, I do not feel patient needs to be admitted for observation.    Dispostion and problem list  After consideration of the diagnostic results and the patients response to treatment, I feel that the patent would benefit from discharge.  Abdominal pain-since  resolved, possible she is suffering from gastroenteritis, will discharge home with antiemetics, Pepcid, recommend a bland diet, follow-up PCP for further assessment strict return precautions.            Final Clinical Impression(s) / ED Diagnoses Final diagnoses:  Left upper quadrant abdominal pain    Rx / DC Orders ED Discharge  Orders          Ordered    famotidine (PEPCID) 20 MG tablet  2 times daily        05/01/23 0456    ondansetron (ZOFRAN) 4 MG tablet  Every 6 hours        05/01/23 0456    dicyclomine (BENTYL) 20 MG tablet  2 times daily        05/01/23 0457              Carroll Sage, PA-C 05/01/23 0458    Gloris Manchester, MD 05/01/23 [phone]

## 2023-07-17 ENCOUNTER — Ambulatory Visit: Payer: MEDICAID | Admitting: Pulmonary Disease

## 2023-09-06 ENCOUNTER — Ambulatory Visit: Payer: MEDICAID | Admitting: Pulmonary Disease

## 2023-09-06 ENCOUNTER — Encounter: Payer: Self-pay | Admitting: Pulmonary Disease

## 2023-09-06 VITALS — BP 110/86 | HR 90 | Ht 68.0 in | Wt 183.4 lb

## 2023-09-06 DIAGNOSIS — F332 Major depressive disorder, recurrent severe without psychotic features: Secondary | ICD-10-CM

## 2023-09-06 DIAGNOSIS — G4731 Primary central sleep apnea: Secondary | ICD-10-CM

## 2023-09-06 MED ORDER — BUDESONIDE-FORMOTEROL FUMARATE 80-4.5 MCG/ACT IN AERO: 2.0000 | INHALATION_SPRAY | Freq: Two times a day (BID) | RESPIRATORY_TRACT | 5 refills | Status: AC

## 2023-09-06 NOTE — Progress Notes (Addendum)
Kathryn Holland    [id number]    October 13, 1986  Primary Care Physician:Foreman, Dorinda Hill, NP  Referring Physician: Doran Stabler, NP [phone] [address] [place],  [place] [address]  Chief complaint:   Patient with excessive daytime fatigue History of sleep apnea on CPAP therapy  HPI:  Fatigue feels about the same  Continues to use BiPAP regularly  Not snoring with BiPAP in place  Sleeps 5-6 hours Appears to tolerate the ResMed AirFit F20 medium size mask the best  Continues on Symbicort Recent PFT was normal  Still does have issues with her fibromyalgia with associated fatigue Usually able to fall asleep easily, falls asleep in a few minutes, 3-4 awakenings  Weight has been stable  Admits to dryness of her mouth in the mornings, no morning headaches She does have night sweats  Has a lot of nerve pain -Continues on Neurontin  Psychotropic medications being optimized  Outpatient Encounter Medications as of 09/06/2023  Medication Sig   azelastine (ASTELIN) 0.1 % nasal spray Place 1 spray into both nostrils 2 (two) times daily. Use in each nostril as directed   B Complex-C (B-COMPLEX WITH VITAMIN C) tablet Take 1 tablet by mouth daily.   budesonide-formoterol (SYMBICORT) 80-4.5 MCG/ACT inhaler Inhale 2 puffs into the lungs every 12 (twelve) hours.   dicyclomine (BENTYL) 20 MG tablet Take 1 tablet (20 mg total) by mouth 2 (two) times daily.   etonogestrel (NEXPLANON) 68 MG IMPL implant 68 mg by Subdermal route once.    famotidine (PEPCID) 20 MG tablet Take 1 tablet (20 mg total) by mouth 2 (two) times daily.   gabapentin (NEURONTIN) 600 MG tablet Take 1 tablet (600 mg total) by mouth 3 (three) times daily.   hydrOXYzine (ATARAX) 10 MG tablet Take 10 mg by mouth 3 (three) times daily as needed.   meloxicam (MOBIC) 15 MG tablet meloxicam 15 mg tablet  Take 1 tablet every day by oral route.   naproxen sodium (ALEVE) 220 MG tablet Take 220 mg by mouth.    ondansetron (ZOFRAN) 4 MG tablet Take 1 tablet (4 mg total) by mouth every 6 (six) hours.   pantoprazole (PROTONIX) 20 MG tablet Take 20 mg by mouth daily.   potassium chloride SA (KLOR-CON M) 20 MEQ tablet Take by mouth.   prazosin (MINIPRESS) 2 MG capsule Take 1 capsule (2 mg total) by mouth at bedtime. Start with 2 mg at night, increase to 4 mg after 2 to 3 days, increase to 6 mg after another 2 to 3 days Go back to a tolerated dose if you are have any lightheadedness or dizziness or increased sleepiness   sertraline (ZOLOFT) 50 MG tablet Take by mouth.   triamcinolone cream (KENALOG) 0.1 % Apply 1 Application topically 2 (two) times daily.   VRAYLAR 1.5 MG capsule Take 1.5 mg by mouth daily.   [DISCONTINUED] doxepin (SINEQUAN) 25 MG capsule Take 1 capsule (25 mg total) by mouth at bedtime and may repeat dose one time if needed. For depression (Patient not taking: Reported on 03/10/2019)   [DISCONTINUED] loratadine (CLARITIN) 10 MG tablet Take 1 tablet (10 mg total) by mouth daily. For allergies (Patient not taking: Reported on 03/10/2019)   [DISCONTINUED] omeprazole (PRILOSEC) 40 MG capsule Take 1 capsule (40 mg total) by mouth daily. (Patient not taking: Reported on 05/20/2020)   No facility-administered encounter medications on file as of 09/06/2023.    Allergies as of 09/06/2023 - Review Complete 09/06/2023  Allergen Reaction Noted   Butorphanol Anaphylaxis 02/01/2021   Stadol [butorphanol tartrate] Other (See Comments) 09/28/2011    Past Medical History:  Diagnosis Date   Gallstones    Kidney stone    Ovarian cyst    PUD (peptic ulcer disease)     Past Surgical History:  Procedure Laterality Date   LITHOTRIPSY      Family History  Problem Relation Age of Onset   Endometrial cancer Mother    Bipolar disorder Sister    Hypertension Father    Heart attack Father     Social History   Socioeconomic History   Marital status: Single    Spouse name: Not on file   Number  of children: 3   Years of education: 69 +  [employer] COL[employer]   Highest education level: Not on file  Occupational History   Occupation: UNEMPLOYED  Tobacco Use   Smoking status: Former    Current packs/day: 0.00    Average packs/day: 1 pack/day for 0.5 years (0.5 ttl pk-yrs)    Types: Cigarettes    Start date: 04/2020    Quit date: 2022    Years since quitting: 2.8    Passive exposure: Past   Smokeless tobacco: Never   Tobacco comments:    QUIT THREE WEEKS AGO, She reports sometimes vaping.   Substance and Sexual Activity   Alcohol use: Not Currently    Comment: socially   Drug use: Yes    Types: Amphetamines   Sexual activity: Yes    Birth control/protection: None  Other Topics Concern   Not on file  Social History Narrative   Not on file   Social Determinants of Health   Financial Resource Strain: Not on file  Food Insecurity: Not on file  Transportation Needs: Not on file  Physical Activity: Not on file  Stress: Not on file  Social Connections: Not on file  Intimate Partner Violence: Not on file    Review of Systems  Constitutional:  Positive for fatigue.  Respiratory:  Positive for apnea and shortness of breath.   Psychiatric/Behavioral:  Positive for sleep disturbance.    Vitals:   09/06/23 1120  BP: 110/86  Pulse: 90  SpO2: 98%   Physical Exam Constitutional:      Appearance: She is obese.  HENT:     Head: Normocephalic.     Mouth/Throat:     Mouth: Mucous membranes are moist.     Comments: Crowded oropharynx with Mallampati 4 Eyes:     Pupils: Pupils are equal, round, and reactive to light.  Cardiovascular:     Rate and Rhythm: Normal rate and regular rhythm.     Heart sounds: No murmur heard.    No friction rub.  Pulmonary:     Effort: No respiratory distress.     Breath sounds: No stridor. No wheezing or rhonchi.  Musculoskeletal:     Cervical back: No rigidity or tenderness.  Skin:    Findings: No lesion or rash.  Neurological:     Mental  Status: She is alert.  Psychiatric:        Mood and Affect: Mood normal.        Behavior: Behavior normal.   Data Reviewed: Download not available at present  Having some mask issues  PFT reviewed and is within normal limits  Assessment:   Central sleep apnea -Continue BiPAP therapy -Will try and get a download from the machine  Nightmares appears improved with prazosin -Prefers to stay on  same dose at present -Only on 2 mg of prazosin  Active smoker -Smoking cessation counseling  Plan/Recommendations: Continue BiPAP  Continue prazosin  Continue Symbicort  Follow-up in 3 months  Graded activities as tolerated  You are encouraged to call with significant concerns  Virl Diamond MD [facility] 09/06/2023, 11:22 AM  CC: Mikki Santee Key, *  CPAP compliance download reviewed showing excellent compliance at 100% She is on BiPAP with IPAP maximum of 20, EPAP minimum of 5, Average usage of 4 hours no significant leaks still with elevated AHI of 30

## 2023-09-06 NOTE — Patient Instructions (Addendum)
I will see will see you in about 3 months  Refills for your Symbicort sent to pharmacy  Call us with significant concerns  still working on getting your download, will make sure I review this

## 2023-09-13 ENCOUNTER — Telehealth: Payer: Self-pay | Admitting: Pulmonary Disease

## 2023-09-13 NOTE — Telephone Encounter (Signed)
Pt states her CPAP is not working, her airflow is cutting on & off. She got it from [facility]

## 2023-09-20 ENCOUNTER — Encounter: Payer: Self-pay | Admitting: *Deleted

## 2023-09-20 NOTE — Telephone Encounter (Signed)
ATC pt vmail box full. Send mychart msg for patient to contact [facility] about issues with CPAP. If no resolution to contact our office.

## 2023-10-16 ENCOUNTER — Telehealth (HOSPITAL_COMMUNITY): Payer: Self-pay

## 2023-10-16 NOTE — Telephone Encounter (Signed)
Therapist receives a referral from [facility] [employer] for [place] services. This therapist calls Petra to speak with her.  Therapist confirms her identity by using two identifiers.  Therapist explains her reason for calling and asks Marykathleen which county she lives in. She reports she lives in [place] and has [place].  Therapist encourages her to call [facility] to ask them who they contract with for [facility] services. She agreed to do so if she decides to engage in said services.  Remigio Eisenmenger, MS, LMFT, LCAS 10/16/23

## 2023-12-10 ENCOUNTER — Ambulatory Visit: Payer: MEDICAID | Admitting: Pulmonary Disease

## 2023-12-10 ENCOUNTER — Encounter: Payer: Self-pay | Admitting: Pulmonary Disease
